# Patient Record
Sex: Female | Born: 1970 | Race: White | Hispanic: No | Marital: Married | Smoking: Current every day smoker
Health system: Southern US, Community
[De-identification: ages and names within clinical notes are randomized; demographics above are authoritative.]

## PROBLEM LIST (undated history)

## (undated) DIAGNOSIS — G459 Transient cerebral ischemic attack, unspecified: Secondary | ICD-10-CM

## (undated) DIAGNOSIS — I10 Essential (primary) hypertension: Secondary | ICD-10-CM

## (undated) DIAGNOSIS — I639 Cerebral infarction, unspecified: Secondary | ICD-10-CM

## (undated) DIAGNOSIS — G43909 Migraine, unspecified, not intractable, without status migrainosus: Secondary | ICD-10-CM

## (undated) DIAGNOSIS — R000 Tachycardia, unspecified: Secondary | ICD-10-CM

## (undated) DIAGNOSIS — R0609 Other forms of dyspnea: Secondary | ICD-10-CM

## (undated) DIAGNOSIS — F419 Anxiety disorder, unspecified: Secondary | ICD-10-CM

## (undated) HISTORY — PX: BACK SURGERY: SHX140

## (undated) HISTORY — PX: HERNIA REPAIR: SHX51

## (undated) HISTORY — DX: Other forms of dyspnea: R06.09

## (undated) HISTORY — PX: COSMETIC SURGERY: SHX468

---

## 2002-09-28 HISTORY — PX: TUBAL LIGATION: SHX77

## 2011-09-29 DIAGNOSIS — R0600 Dyspnea, unspecified: Secondary | ICD-10-CM

## 2011-09-29 DIAGNOSIS — R0609 Other forms of dyspnea: Secondary | ICD-10-CM

## 2011-09-29 HISTORY — DX: Dyspnea, unspecified: R06.00

## 2011-09-29 HISTORY — DX: Other forms of dyspnea: R06.09

## 2014-05-24 ENCOUNTER — Emergency Department (HOSPITAL_COMMUNITY)
Admission: EM | Admit: 2014-05-24 | Discharge: 2014-05-24 | Disposition: A | Discharge status: Home / Self Care | Payer: Self-pay | Attending: Emergency Medicine | Admitting: Emergency Medicine

## 2014-05-24 ENCOUNTER — Encounter (HOSPITAL_COMMUNITY): Payer: Self-pay | Admitting: Emergency Medicine

## 2014-05-24 DIAGNOSIS — R51 Headache: Secondary | ICD-10-CM | POA: Insufficient documentation

## 2014-05-24 DIAGNOSIS — F17200 Nicotine dependence, unspecified, uncomplicated: Secondary | ICD-10-CM | POA: Insufficient documentation

## 2014-05-24 DIAGNOSIS — Z8659 Personal history of other mental and behavioral disorders: Secondary | ICD-10-CM | POA: Insufficient documentation

## 2014-05-24 DIAGNOSIS — R000 Tachycardia, unspecified: Secondary | ICD-10-CM | POA: Insufficient documentation

## 2014-05-24 DIAGNOSIS — G43809 Other migraine, not intractable, without status migrainosus: Secondary | ICD-10-CM | POA: Insufficient documentation

## 2014-05-24 DIAGNOSIS — I10 Essential (primary) hypertension: Secondary | ICD-10-CM | POA: Insufficient documentation

## 2014-05-24 HISTORY — DX: Anxiety disorder, unspecified: F41.9

## 2014-05-24 HISTORY — DX: Essential (primary) hypertension: I10

## 2014-05-24 HISTORY — DX: Tachycardia, unspecified: R00.0

## 2014-05-24 HISTORY — DX: Migraine, unspecified, not intractable, without status migrainosus: G43.909

## 2014-05-24 MED ORDER — DEXAMETHASONE SODIUM PHOSPHATE 4 MG/ML IJ SOLN
10.0000 mg | Freq: Once | INTRAMUSCULAR | Status: AC
  Administered 2014-05-24: 10 mg via INTRAVENOUS
  Filled 2014-05-24: qty 3

## 2014-05-24 MED ORDER — METOCLOPRAMIDE HCL 5 MG/ML IJ SOLN
10.0000 mg | Freq: Once | INTRAMUSCULAR | Status: AC
  Administered 2014-05-24: 10 mg via INTRAVENOUS
  Filled 2014-05-24: qty 2

## 2014-05-24 MED ORDER — PROMETHAZINE HCL 25 MG PO TABS
25.0000 mg | ORAL_TABLET | Freq: Four times a day (QID) | ORAL | Status: DC | PRN
Start: 2014-05-24 — End: 2014-06-07

## 2014-05-24 MED ORDER — BUTALBITAL-APAP-CAFFEINE 50-325-40 MG PO TABS: 1.0000 | ORAL_TABLET | Freq: Four times a day (QID) | ORAL | Status: DC | PRN

## 2014-05-24 MED ORDER — KETOROLAC TROMETHAMINE 30 MG/ML IJ SOLN
30.0000 mg | Freq: Once | INTRAMUSCULAR | Status: AC
  Administered 2014-05-24: 30 mg via INTRAVENOUS
  Filled 2014-05-24: qty 1

## 2014-05-24 MED ORDER — SODIUM CHLORIDE 0.9 % IV BOLUS (SEPSIS)
1000.0000 mL | Freq: Once | INTRAVENOUS | Status: AC
  Administered 2014-05-24: 1000 mL via INTRAVENOUS

## 2014-05-24 MED ORDER — PROMETHAZINE HCL 25 MG/ML IJ SOLN
25.0000 mg | INTRAMUSCULAR | Status: DC | PRN
  Administered 2014-05-24: 25 mg via INTRAVENOUS
  Filled 2014-05-24: qty 1

## 2014-05-24 MED ORDER — DIPHENHYDRAMINE HCL 50 MG/ML IJ SOLN
25.0000 mg | Freq: Once | INTRAMUSCULAR | Status: AC
  Administered 2014-05-24: 25 mg via INTRAVENOUS
  Filled 2014-05-24: qty 1

## 2014-05-24 MED ORDER — HYDROMORPHONE HCL PF 1 MG/ML IJ SOLN
0.5000 mg | Freq: Once | INTRAMUSCULAR | Status: AC
  Administered 2014-05-24: 0.5 mg via INTRAVENOUS
  Filled 2014-05-24: qty 1

## 2014-05-24 NOTE — Discharge Instructions (Signed)

## 2014-05-24 NOTE — ED Notes (Signed)
Pt co migraine on and off x 7 days, with nausea. Pt has HX of migraines. Light and sound sensitivity noted.

## 2014-05-24 NOTE — ED Provider Notes (Signed)
This chart was scribed for Dominique Maw Alasha Mcguinness, DO, by Yevette Edwards, ED Scribe. This patient was seen in room APA07/APA07 and the patient's care was started at 3:17 PM.  TIME SEEN: 3:17 PM  CHIEF COMPLAINT:  Chief Complaint  Patient presents with  . Migraine    HPI:   HPI Comments: Dominique Harrington is a 43 y.o. female history of hypertension, migraine headaches who presents to the Emergency Department complaining of a migraine which has persisted for seven days intermittently and which has been associated with nausea, a decreased appetite and photophobia. She describes the pain as "someone taking an axe to the top of my head." Light and noise increase the pain. She has used Excedrin, ibuprofen, and benadryl without resolution. She has been treated in [facility] ED for migraines previously; she reports improvement with Tramadol, Dilaudid, Nubain, and Phenergan. She does not find improvement with morphine. If this headache feels exactly like her prior migraines.  She denies recent head injury, emesis, neck pain, neck stiffness, or fevers. She is not on anticoagulation.  The pt takes Toperol XL 100 mg for HTN; she took it today. In the ED her BP is 149/124.   Dominique Harrington is a current smoker.   ROS: See HPI Constitutional: no fever  Eyes: no drainage  ENT: no runny nose   Cardiovascular:  no chest pain  Resp: no SOB  GI: no vomiting, positive nausea GU: no dysuria Integumentary: no rash  Allergy: no hives  Musculoskeletal: no leg swelling, no neck pain, no neck stiffness Neurological: no slurred speech ROS otherwise negative  PAST MEDICAL HISTORY/PAST SURGICAL HISTORY:  Past Medical History  Diagnosis Date  . Hypertension   . Migraines   . Anxiety   . Tachycardia     MEDICATIONS:  Prior to Admission medications   Not on File    ALLERGIES:  Allergies  Allergen Reactions  . Sulfa Antibiotics Nausea And Vomiting    SOCIAL HISTORY:  History  Substance Use Topics  .  Smoking status: Current Every Day Smoker -- 1.00 packs/day    Types: Cigarettes  . Smokeless tobacco: Not on file  . Alcohol Use: No    FAMILY HISTORY: History reviewed. No pertinent family history.  EXAM: Triage Vitals: BP 149/124  Pulse 115  Temp(Src) 98.5 F (36.9 C) (Oral)  Resp 20  SpO2 99%  LMP 05/17/2014  CONSTITUTIONAL: Alert and oriented and responds appropriately to questions. Well-appearing; well-nourished HEAD: Normocephalic EYES: Conjunctivae clear, PERRL; photophobia present ENT: normal nose; no rhinorrhea; moist mucous membranes; pharynx without lesions noted NECK: Supple, no meningismus, no LAD  CARD: Regular and tachycardic; S1 and S2 appreciated; no murmurs, no clicks, no rubs, no gallops RESP: Normal chest excursion without splinting or tachypnea; breath sounds clear and equal bilaterally; no wheezes, no rhonchi, no rales,  ABD/GI: Normal bowel sounds; non-distended; soft, non-tender, no rebound, no guarding BACK:  The back appears normal and is non-tender to palpation, there is no CVA tenderness EXT: Normal ROM in all joints; non-tender to palpation; no edema; normal capillary refill; no cyanosis    SKIN: Normal color for age and race; warm NEURO: Moves all extremities equally; sensation intact diffusely; strength 5/5 in all 4 extremities; cranial nerves 2-12 intact PSYCH: The patient's mood and manner are appropriate. Grooming and personal hygiene are appropriate.  MEDICAL DECISION MAKING: Patient here with typical migraine headache. Will give migraine cocktail with Toradol, Reglan, Benadryl, IV fluids and Decadron. I am not concerned for any intracranial hemorrhage, infarct,  cavernous sinus thrombosis, infectious etiology. She is neurologically intact. She is hypertensive and states she has taken her blood pressure medication today. Suspect this is secondary to pain and we'll reassess once her pain is better controlled.  ED PROGRESS:    4:30 PM  Pt reports  mild improvement with migraine cocktail. She states her headache is now completely gone. She is asking something else for control for her pain. We'll give 0.5mg  IV Diluadid and phenergan.  6:15 PM  Pt reports her pain is now completely gone. She states that she feels much better and is ready for discharge home. Her blood pressure has also improved. I feel she is safe to be discharged. We'll discharge with prescription for Fioricet and Phenergan to take at home. Have discussed strict return precautions and supportive care instructions. She verbalized understanding and is comfortable with plan.  Dominique Maw Deborha Moseley, DO 05/24/14 1825

## 2014-06-07 ENCOUNTER — Encounter (HOSPITAL_COMMUNITY): Payer: Self-pay | Admitting: Emergency Medicine

## 2014-06-07 ENCOUNTER — Emergency Department (HOSPITAL_COMMUNITY)
Admission: EM | Admit: 2014-06-07 | Discharge: 2014-06-07 | Disposition: A | Discharge status: Home / Self Care | Payer: Self-pay | Attending: Emergency Medicine | Admitting: Emergency Medicine

## 2014-06-07 DIAGNOSIS — I10 Essential (primary) hypertension: Secondary | ICD-10-CM | POA: Insufficient documentation

## 2014-06-07 DIAGNOSIS — Z79899 Other long term (current) drug therapy: Secondary | ICD-10-CM | POA: Insufficient documentation

## 2014-06-07 DIAGNOSIS — Z8659 Personal history of other mental and behavioral disorders: Secondary | ICD-10-CM | POA: Insufficient documentation

## 2014-06-07 DIAGNOSIS — G43909 Migraine, unspecified, not intractable, without status migrainosus: Secondary | ICD-10-CM | POA: Insufficient documentation

## 2014-06-07 DIAGNOSIS — F17200 Nicotine dependence, unspecified, uncomplicated: Secondary | ICD-10-CM | POA: Insufficient documentation

## 2014-06-07 MED ORDER — METOCLOPRAMIDE HCL 5 MG/ML IJ SOLN
10.0000 mg | Freq: Once | INTRAMUSCULAR | Status: AC
  Administered 2014-06-07: 10 mg via INTRAVENOUS
  Filled 2014-06-07: qty 2

## 2014-06-07 MED ORDER — KETOROLAC TROMETHAMINE 30 MG/ML IJ SOLN
30.0000 mg | Freq: Once | INTRAMUSCULAR | Status: AC
  Administered 2014-06-07: 30 mg via INTRAVENOUS
  Filled 2014-06-07: qty 1

## 2014-06-07 MED ORDER — DIPHENHYDRAMINE HCL 50 MG/ML IJ SOLN
25.0000 mg | Freq: Once | INTRAMUSCULAR | Status: AC
  Administered 2014-06-07: 25 mg via INTRAVENOUS
  Filled 2014-06-07: qty 1

## 2014-06-07 MED ORDER — SODIUM CHLORIDE 0.9 % IV BOLUS (SEPSIS)
1000.0000 mL | Freq: Once | INTRAVENOUS | Status: AC
  Administered 2014-06-07: 1000 mL via INTRAVENOUS

## 2014-06-07 NOTE — Discharge Instructions (Signed)
Resting quiet dark room. Increase fluids.

## 2014-06-07 NOTE — ED Provider Notes (Signed)
CSN: [id number]     Arrival date & time 06/07/14  0915 History  This chart was scribed for Donnetta Hutching, MD by Leone Payor, ED Scribe. This patient was seen in room APA12/APA12 and the patient's care was started 9:57 AM.     Chief Complaint  Patient presents with  . Migraine   The history is provided by the patient. No language interpreter was used.    HPI Comments: Dominique Harrington is a 43 y.o. female who presents to the Emergency Department complaining of 2 weeks of intermittent, gradually worsened frontal HA with associated photophobia and nausea. She reports having vomiting that began this morning. She has a history of migraines and states this is similar in nature. She has taken Tylenol without relief. Severity is moderate. Negative CT head in the past.  Past Medical History  Diagnosis Date  . Hypertension   . Migraines   . Anxiety   . Tachycardia    Past Surgical History  Procedure Laterality Date  . Tubal ligation  2004  . Back surgery    . Cosmetic surgery    . Hernia repair     History reviewed. No pertinent family history. History  Substance Use Topics  . Smoking status: Current Every Day Smoker -- 1.00 packs/day    Types: Cigarettes  . Smokeless tobacco: Not on file  . Alcohol Use: No   OB History   Grav Para Term Preterm Abortions TAB SAB Ect Mult Living                 Review of Systems  A complete 10 system review of systems was obtained and all systems are negative except as noted in the HPI and PMH.    Allergies  Sulfa antibiotics  Home Medications   Prior to Admission medications   Medication Sig Start Date End Date Taking? Authorizing Provider  acetaminophen (TYLENOL) 500 MG tablet Take 1,000 mg by mouth daily as needed for headache.   Yes Historical Provider, MD  amLODipine (NORVASC) 5 MG tablet Take 5 mg by mouth as needed (chest pain).   Yes Historical Provider, MD  ibuprofen (ADVIL,MOTRIN) 200 MG tablet Take 800 mg by mouth as needed for  headache.   Yes Historical Provider, MD  metoprolol succinate (TOPROL-XL) 100 MG 24 hr tablet Take 100 mg by mouth daily. Take with or immediately following a meal.   Yes Historical Provider, MD   BP 139/102  Pulse 102  Temp(Src) 98.7 F (37.1 C) (Oral)  Resp 18  Ht  (1.702 m)  Wt 230 lb (104.327 kg)  BMI 36.01 kg/m2  SpO2 97%  LMP 05/17/2014 Physical Exam  Nursing note and vitals reviewed. Constitutional: She is oriented to person, place, and time. She appears well-developed and well-nourished.  HENT:  Head: Normocephalic and atraumatic.  Eyes: Conjunctivae and EOM are normal. Pupils are equal, round, and reactive to light.  Photophobia noted  Neck: Normal range of motion. Neck supple.  Cardiovascular: Normal rate, regular rhythm and normal heart sounds.   Pulmonary/Chest: Effort normal and breath sounds normal.  Abdominal: Soft. Bowel sounds are normal.  Musculoskeletal: Normal range of motion.  Neurological: She is alert and oriented to person, place, and time.  Skin: Skin is warm and dry.  Psychiatric: She has a normal mood and affect. Her behavior is normal.    ED Course  Procedures (including critical care time)  DIAGNOSTIC STUDIES: Oxygen Saturation is 97% on RA, adequate by my interpretation.  COORDINATION OF CARE: 10:01AM Discussed treatment plan with pt at bedside and pt agreed to plan.   Labs Review Labs Reviewed - No data to display  Imaging Review No results found.   EKG Interpretation None      MDM   Final diagnoses:  Migraine without status migrainosus, not intractable, unspecified migraine type   No neurological deficits. Patient feels better after IV fluids, IV Benadryl, Reglan, Toradol  I personally performed the services described in this documentation, which was scribed in my presence. The recorded information has been reviewed and is accurate.   Donnetta Hutching, MD 06/07/14 1110

## 2014-06-07 NOTE — ED Notes (Signed)
Migraine off and on for 2 weeks. States she is vomiting now and unable to manage it at home.

## 2014-10-24 ENCOUNTER — Emergency Department (HOSPITAL_COMMUNITY)
Admission: EM | Admit: 2014-10-24 | Discharge: 2014-10-24 | Disposition: A | Discharge status: Home / Self Care | Payer: Self-pay | Attending: Emergency Medicine | Admitting: Emergency Medicine

## 2014-10-24 ENCOUNTER — Encounter (HOSPITAL_COMMUNITY): Payer: Self-pay | Admitting: *Deleted

## 2014-10-24 DIAGNOSIS — Z720 Tobacco use: Secondary | ICD-10-CM | POA: Insufficient documentation

## 2014-10-24 DIAGNOSIS — I10 Essential (primary) hypertension: Secondary | ICD-10-CM | POA: Insufficient documentation

## 2014-10-24 DIAGNOSIS — G43009 Migraine without aura, not intractable, without status migrainosus: Secondary | ICD-10-CM

## 2014-10-24 DIAGNOSIS — Z8659 Personal history of other mental and behavioral disorders: Secondary | ICD-10-CM | POA: Insufficient documentation

## 2014-10-24 DIAGNOSIS — Z79899 Other long term (current) drug therapy: Secondary | ICD-10-CM | POA: Insufficient documentation

## 2014-10-24 MED ORDER — BUTALBITAL-APAP-CAFFEINE 50-325-40 MG PO TABS: 1.0000 | ORAL_TABLET | Freq: Four times a day (QID) | ORAL | Status: DC | PRN

## 2014-10-24 MED ORDER — PROMETHAZINE HCL 12.5 MG PO TABS: 12.5000 mg | ORAL_TABLET | Freq: Four times a day (QID) | ORAL | Status: DC | PRN

## 2014-10-24 MED ORDER — DEXAMETHASONE SODIUM PHOSPHATE 4 MG/ML IJ SOLN
10.0000 mg | Freq: Once | INTRAMUSCULAR | Status: AC
  Administered 2014-10-24: 10 mg via INTRAVENOUS
  Filled 2014-10-24: qty 3

## 2014-10-24 MED ORDER — SODIUM CHLORIDE 0.9 % IV SOLN
INTRAVENOUS | Status: DC
  Administered 2014-10-24: 11:00:00 via INTRAVENOUS

## 2014-10-24 MED ORDER — METOCLOPRAMIDE HCL 5 MG/ML IJ SOLN
10.0000 mg | Freq: Once | INTRAMUSCULAR | Status: AC
  Administered 2014-10-24: 10 mg via INTRAVENOUS
  Filled 2014-10-24: qty 2

## 2014-10-24 MED ORDER — DIPHENHYDRAMINE HCL 50 MG/ML IJ SOLN
25.0000 mg | Freq: Once | INTRAMUSCULAR | Status: AC
  Administered 2014-10-24: 25 mg via INTRAVENOUS
  Filled 2014-10-24: qty 1

## 2014-10-24 NOTE — ED Notes (Signed)
Tolerated fluid intake well.   

## 2014-10-24 NOTE — Discharge Instructions (Signed)
Be sure to follow up for your elevated blood pressure. Return here as needed.   Migraine Headache A migraine headache is very bad, throbbing pain on one or both sides of your head. Talk to your doctor about what things may bring on (trigger) your migraine headaches. HOME CARE  Only take medicines as told by your doctor.  Lie down in a dark, quiet room when you have a migraine.  Keep a journal to find out if certain things bring on migraine headaches. For example, write down:  What you eat and drink.  How much sleep you get.  Any change to your diet or medicines.  Lessen how much alcohol you drink.  Quit smoking if you smoke.  Get enough sleep.  Lessen any stress in your life.  Keep lights dim if bright lights bother you or make your migraines worse. GET HELP RIGHT AWAY IF:   Your migraine becomes really bad.  You have a fever.  You have a stiff neck.  You have trouble seeing.  Your muscles are weak, or you lose muscle control.  You lose your balance or have trouble walking.  You feel like you will pass out (faint), or you pass out.  You have really bad symptoms that are different than your first symptoms. MAKE SURE YOU:   Understand these instructions.  Will watch your condition.  Will get help right away if you are not doing well or get worse. Document Released: 06/23/2008 Document Revised: 12/07/2011 Document Reviewed: 05/22/2013 [facility] Patient Information 2015 [employer] [place]. This information is not intended to replace advice given to you by your health care provider. Make sure you discuss any questions you have with your health care provider.

## 2014-10-24 NOTE — ED Notes (Signed)
Migraine x 4-5 days. Vomiting began yesterday. Able to keep meds down. Headache is similar to those in the past.

## 2014-10-24 NOTE — ED Provider Notes (Signed)
CSN: [id number]     Arrival date & time 10/24/14  1028 History   First MD Initiated Contact with Patient 10/24/14 1029     Chief Complaint  Patient presents with  . Migraine     (Consider location/radiation/quality/duration/timing/severity/associated sxs/prior Treatment) Patient is a 44 y.o. female presenting with migraines. The history is provided by the patient. No language interpreter was used.  Migraine This is a new problem. The current episode started in the past 7 days. The problem occurs constantly. The problem has been gradually worsening. Associated symptoms include headaches, nausea and vomiting. Exacerbated by: light, noise. She has tried NSAIDs and acetaminophen for the symptoms. The treatment provided no relief.   Dominique Harrington is a 44 y.o. female who presents to the ED with a headache that started 4 days ago. She has a history of migraines and this is similar. She has taken OTC medications without relief. She had nausea yesterday and today has been vomiting. The headache is located in the frontal area and she describes the pain as throbbing each time her heart beats.   Past Medical History  Diagnosis Date  . Hypertension   . Migraines   . Anxiety   . Tachycardia    Past Surgical History  Procedure Laterality Date  . Tubal ligation  2004  . Back surgery    . Cosmetic surgery    . Hernia repair     No family history on file. History  Substance Use Topics  . Smoking status: Current Every Day Smoker -- 1.00 packs/day    Types: Cigarettes  . Smokeless tobacco: Not on file  . Alcohol Use: No   OB History    No data available     Review of Systems  Gastrointestinal: Positive for nausea and vomiting.  Neurological: Positive for light-headedness and headaches.  all other systems negative    Allergies  Sulfa antibiotics  Home Medications   Prior to Admission medications   Medication Sig Start Date End Date Taking? Authorizing Provider  acetaminophen  (TYLENOL) 500 MG tablet Take 1,000 mg by mouth daily as needed for headache.   Yes Historical Provider, MD  amLODipine (NORVASC) 5 MG tablet Take 5 mg by mouth as needed (chest pain).   Yes Historical Provider, MD  ibuprofen (ADVIL,MOTRIN) 200 MG tablet Take 800 mg by mouth as needed for headache.   Yes Historical Provider, MD  metoprolol succinate (TOPROL-XL) 100 MG 24 hr tablet Take 100 mg by mouth daily. Take with or immediately following a meal.   Yes Historical Provider, MD  butalbital-acetaminophen-caffeine (FIORICET) 50-325-40 MG per tablet Take 1-2 tablets by mouth every 6 (six) hours as needed for migraine. 10/24/14 10/24/15  Hope Orlene Och, NP  promethazine (PHENERGAN) 12.5 MG tablet Take 1 tablet (12.5 mg total) by mouth every 6 (six) hours as needed for nausea or vomiting. 10/24/14   Hope Orlene Och, NP   BP 163/108 mmHg  Pulse 74  Resp 17  SpO2 100% Physical Exam  Constitutional: She is oriented to person, place, and time. She appears well-developed and well-nourished. No distress.  HENT:  Head: Normocephalic and atraumatic.  Right Ear: Tympanic membrane normal.  Left Ear: Tympanic membrane normal.  Nose: Nose normal.  Mouth/Throat: Uvula is midline, oropharynx is clear and moist and mucous membranes are normal.  Eyes: Conjunctivae and EOM are normal.  Neck: Normal range of motion. Neck supple.  Cardiovascular: Normal rate and regular rhythm.   Pulmonary/Chest: Effort normal. She has no wheezes. She  has no rales.  Abdominal: Soft. Bowel sounds are normal. She exhibits no mass. There is no tenderness.  Musculoskeletal: She exhibits no edema.  Radial and pedal pulses strong, adequate circulation, good touch sensation.  Neurological: She is alert and oriented to person, place, and time. She has normal strength. No cranial nerve deficit or sensory deficit. She displays a negative Romberg sign. Gait normal.  Reflex Scores:      Bicep reflexes are 2+ on the right side and 2+ on the left  side.      Brachioradialis reflexes are 2+ on the right side and 2+ on the left side.      Patellar reflexes are 2+ on the right side and 2+ on the left side.      Achilles reflexes are 2+ on the right side and 2+ on the left side. Rapid alternating movement without difficulty. Stands on one foot without difficulty.  Psychiatric: She has a normal mood and affect. Her behavior is normal.  Nursing note and vitals reviewed.   ED Course  Procedures  After IV Benadryl 25mg , Reglan 10 mg and Decadron 10 mg. The patient is feeling much better. No nausea, very little pain. Taking PO fluids without difficulty. Patient states she is ready to go home.  MDM  44 y.o. female with hx of migraines and headache that is similar to usual migraines. Responded well to treatment in the ED. Stable for discharge without neuro deficits. No signs of SAH or meningitis at this time. Discussed with the patient clinical findings and plan of care. I discussed her elevated BP and need for follow up. She voices understanding and agrees with plan. She is taking her BP medication as directed. All questioned fully answered. She will return if any problems arise.  Final diagnoses:  Migraine without aura and without status migrainosus, not intractable      Janne NapoleonHope M Neese, NP 10/24/14 1236  Gilda Creasehristopher J. Pollina, MD 10/24/14 1537

## 2014-10-24 NOTE — ED Notes (Signed)
Informed Kerrie BuffaloHope Neese, NP of results.  Okay to d/c home if DBP in low 100's.

## 2014-10-24 NOTE — ED Notes (Signed)
Given Spite.

## 2014-12-12 ENCOUNTER — Other Ambulatory Visit (HOSPITAL_COMMUNITY): Payer: Self-pay | Admitting: Physician Assistant

## 2014-12-12 DIAGNOSIS — Z1231 Encounter for screening mammogram for malignant neoplasm of breast: Secondary | ICD-10-CM

## 2014-12-19 ENCOUNTER — Ambulatory Visit (HOSPITAL_COMMUNITY)
Admission: RE | Admit: 2014-12-19 | Discharge: 2014-12-19 | Disposition: A | Discharge status: Home / Self Care | Payer: Self-pay | Source: Ambulatory Visit | Attending: Physician Assistant | Admitting: Physician Assistant

## 2014-12-19 DIAGNOSIS — Z1231 Encounter for screening mammogram for malignant neoplasm of breast: Secondary | ICD-10-CM

## 2015-04-17 ENCOUNTER — Encounter: Payer: Self-pay | Admitting: *Deleted

## 2015-05-21 ENCOUNTER — Encounter: Payer: Self-pay | Admitting: Cardiovascular Disease

## 2015-07-31 ENCOUNTER — Encounter (HOSPITAL_COMMUNITY): Payer: Self-pay | Admitting: *Deleted

## 2015-07-31 ENCOUNTER — Emergency Department (HOSPITAL_COMMUNITY)
Admission: EM | Admit: 2015-07-31 | Discharge: 2015-07-31 | Disposition: A | Discharge status: Home / Self Care | Payer: Self-pay | Attending: Emergency Medicine | Admitting: Emergency Medicine

## 2015-07-31 DIAGNOSIS — F419 Anxiety disorder, unspecified: Secondary | ICD-10-CM | POA: Insufficient documentation

## 2015-07-31 DIAGNOSIS — Z720 Tobacco use: Secondary | ICD-10-CM | POA: Insufficient documentation

## 2015-07-31 DIAGNOSIS — G8929 Other chronic pain: Secondary | ICD-10-CM | POA: Insufficient documentation

## 2015-07-31 DIAGNOSIS — I10 Essential (primary) hypertension: Secondary | ICD-10-CM | POA: Insufficient documentation

## 2015-07-31 DIAGNOSIS — Z79899 Other long term (current) drug therapy: Secondary | ICD-10-CM | POA: Insufficient documentation

## 2015-07-31 DIAGNOSIS — G43809 Other migraine, not intractable, without status migrainosus: Secondary | ICD-10-CM

## 2015-07-31 MED ORDER — METOCLOPRAMIDE HCL 10 MG PO TABS: 10.0000 mg | ORAL_TABLET | Freq: Four times a day (QID) | ORAL | Status: DC | PRN

## 2015-07-31 MED ORDER — DIPHENHYDRAMINE HCL 50 MG/ML IJ SOLN
50.0000 mg | Freq: Once | INTRAMUSCULAR | Status: AC
  Administered 2015-07-31: 50 mg via INTRAMUSCULAR
  Filled 2015-07-31: qty 1

## 2015-07-31 MED ORDER — KETOROLAC TROMETHAMINE 60 MG/2ML IM SOLN
60.0000 mg | Freq: Once | INTRAMUSCULAR | Status: AC
  Administered 2015-07-31: 60 mg via INTRAMUSCULAR
  Filled 2015-07-31: qty 2

## 2015-07-31 MED ORDER — METOCLOPRAMIDE HCL 5 MG/ML IJ SOLN
10.0000 mg | Freq: Once | INTRAMUSCULAR | Status: AC
  Administered 2015-07-31: 10 mg via INTRAMUSCULAR
  Filled 2015-07-31: qty 2

## 2015-07-31 NOTE — ED Notes (Signed)
Patient reports headache for past week. Became nauseated and sensitive to light Monday. Patient has hx of migraines.

## 2015-07-31 NOTE — ED Provider Notes (Signed)
CSN: [id number]     Arrival date & time 07/31/15  4098 History   First MD Initiated Contact with Patient 07/31/15 [phone]     Chief Complaint  Patient presents with  . Migraine      HPI Pt was seen at 0940. Per pt, c/o gradual onset and persistence of constant acute flair of her chronic migraine headache for the past 1 week.  Describes the headache as per her usual chronic migraine headache pain pattern for many years.  Denies headache was sudden or maximal in onset or at any time.  Denies visual changes, no focal motor weakness, no tingling/numbness in extremities, no fevers, no neck pain, no rash.      Past Medical History  Diagnosis Date  . Hypertension   . Migraines   . Anxiety   . Tachycardia   . Dyspnea on exertion 2013    Echo, EF =>55%   Past Surgical History  Procedure Laterality Date  . Tubal ligation  2004  . Back surgery    . Cosmetic surgery    . Hernia repair      Social History  Substance Use Topics  . Smoking status: Current Every Day Smoker -- 1.00 packs/day    Types: Cigarettes  . Smokeless tobacco: None  . Alcohol Use: No    Review of Systems ROS: Statement: All systems negative except as marked or noted in the HPI; Constitutional: Negative for fever and chills. ; ; Eyes: Negative for eye pain, redness and discharge. ; ; ENMT: Negative for ear pain, hoarseness, nasal congestion, sinus pressure and sore throat. ; ; Cardiovascular: Negative for chest pain, palpitations, diaphoresis, dyspnea and peripheral edema. ; ; Respiratory: Negative for cough, wheezing and stridor. ; ; Gastrointestinal: +nausea. Negative for vomiting, diarrhea, abdominal pain, blood in stool, hematemesis, jaundice and rectal bleeding. . ; ; Genitourinary: Negative for dysuria, flank pain and hematuria. ; ; Musculoskeletal: Negative for back pain and neck pain. Negative for swelling and trauma.; ; Skin: Negative for pruritus, rash, abrasions, blisters, bruising and skin lesion.; ; Neuro:  +headache. Negative for lightheadedness and neck stiffness. Negative for weakness, altered level of consciousness , altered mental status, extremity weakness, paresthesias, involuntary movement, seizure and syncope.      Allergies  Sulfa antibiotics  Home Medications   Prior to Admission medications   Medication Sig Start Date End Date Taking? Authorizing Provider  acetaminophen (TYLENOL) 500 MG tablet Take 1,000 mg by mouth daily as needed for headache.   Yes Historical Provider, MD  amLODipine (NORVASC) 5 MG tablet Take 5 mg by mouth as needed (chest pain).   Yes Historical Provider, MD  busPIRone (BUSPAR) 10 MG tablet Take 10 mg by mouth 2 (two) times daily.   Yes Historical Provider, MD  citalopram (CELEXA) 20 MG tablet Take 20 mg by mouth daily.   Yes Historical Provider, MD  hydrOXYzine (ATARAX/VISTARIL) 25 MG tablet Take 25 mg by mouth 3 (three) times daily as needed.   Yes Historical Provider, MD  ibuprofen (ADVIL,MOTRIN) 200 MG tablet Take 800 mg by mouth as needed for headache.   Yes Historical Provider, MD  metoprolol succinate (TOPROL-XL) 100 MG 24 hr tablet Take 100 mg by mouth daily. Take with or immediately following a meal.   Yes Historical Provider, MD  Omega-3 Fatty Acids (FISH OIL PO) Take 1 capsule by mouth daily.   Yes Historical Provider, MD   BP 166/103 mmHg  Pulse 50  Temp(Src) 97.6 F (36.4 C) (Oral)  Resp  18  Ht 5\' 7"  (1.702 m)  Wt 220 lb (99.791 kg)  BMI 34.45 kg/m2  SpO2 96% Physical Exam  0945: Physical examination:  Nursing notes reviewed; Vital signs and O2 SAT reviewed;  Constitutional: Well developed, Well nourished, Well hydrated, In no acute distress; Head:  Normocephalic, atraumatic; Eyes: EOMI, PERRL, No scleral icterus; ENMT: TM's clear bilat. Mouth and pharynx normal, Mucous membranes moist; Neck: Supple, Full range of motion, No lymphadenopathy; Cardiovascular: Regular rate and rhythm, No murmur, rub, or gallop; Respiratory: Breath sounds clear &  equal bilaterally, No rales, rhonchi, wheezes.  Speaking full sentences with ease, Normal respiratory effort/excursion; Chest: Nontender, Movement normal; Abdomen: Soft, Nontender, Nondistended, Normal bowel sounds; Genitourinary: No CVA tenderness; Extremities: Pulses normal, No tenderness, No edema, No calf edema or asymmetry.; Neuro: AA&Ox3, Major CN grossly intact. No facial droop. Speech clear. No gross focal motor or sensory deficits in extremities. Climbs on and off stretcher easily by herself. Gait steady.; Skin: Color normal, Warm, Dry.   ED Course  Procedures (including critical care time) Labs Review   Imaging Review  I have personally reviewed and evaluated these images and lab results as part of my medical decision-making.   EKG Interpretation None      MDM  MDM Reviewed: previous chart, nursing note and vitals    1035:  Acute flair of chronic headache, no red flags on HPI. Tx headache symptomatically at this time. Dx d/w pt and family.  Questions answered.  Verb understanding, agreeable to d/c home with outpt f/u.     Samuel JesterKathleen Ashish Rossetti, DO 08/04/15 2028

## 2015-07-31 NOTE — Discharge Instructions (Signed)
°Emergency Department Resource Guide °1) Find a Doctor and Pay Out of Pocket °Although you won't have to find out who is covered by your insurance plan, it is a good idea to ask around and get recommendations. You will then need to call the office and see if the doctor you have chosen will accept you as a new patient and what types of options they offer for patients who are self-pay. Some doctors offer discounts or will set up payment plans for their patients who do not have insurance, but you will need to ask so you aren't surprised when you get to your appointment. ° °2) Contact Your Local Health Department °Not all health departments have doctors that can see patients for sick visits, but many do, so it is worth a call to see if yours does. If you don't know where your local health department is, you can check in your phone book. The CDC also has a tool to help you locate your state's health department, and many state websites also have listings of all of their local health departments. ° °3) Find a Walk-in Clinic °If your illness is not likely to be very severe or complicated, you may want to try a walk in clinic. These are popping up all over the country in pharmacies, drugstores, and shopping centers. They're usually staffed by nurse practitioners or physician assistants that have been trained to treat common illnesses and complaints. They're usually fairly quick and inexpensive. However, if you have serious medical issues or chronic medical problems, these are probably not your best option. ° °No Primary Care Doctor: °- Call [facility] at  832-8000 - they can help you locate a primary care doctor that  accepts your insurance, provides certain services, etc. °- Physician Referral Service- 1-800-533-3463 ° °Chronic Pain Problems: °Organization         Address  Phone   Notes  °[facility]  (336) 297-2271 Patients need to be referred by their primary care doctor.  ° °Medication  Assistance: °Organization         Address  Phone   Notes  °[place] [facility] [address] °[place], [place] [address] (336) 641-8030 --Must be a resident of [place] °-- Must have NO insurance coverage whatsoever (no Medicaid/ Medicare, etc.) °-- The pt. MUST have a primary care doctor that directs their care regularly and follows them in the community °  °[facility]  (866) 331-1348   °[employer]  (888) 892-1162   ° °Agencies that provide inexpensive medical care: °Organization         Address  Phone   Notes  °[facility]  (336) 832-8035   °[facility]    (336) 832-7272   °[facility] [address] °[place], [place] [address] (336) 832-4777   °[facility] [address], °[place] (336) 271-4999   °[facility]    (336) 373-0678   °[facility]    (336) 272-1050   °[facility] ° [address], [place] Phone:  (336) 832-4444, Fax:  (336) 832-4440 Hours of Operation:  9 am - 6 pm, M-F.  Also accepts Medicaid/Medicare and self-pay.  °[facility] ° [address], [place] Phone: (336) [phone], Fax: (336) [phone]. Hours of Operation:  8:30 am - 5:30 pm, M-F.  Also accepts Medicaid and self-pay.  °[facility] [place] [address], [place] Phone: (336) 878-6027   °[facility] [address], [place], [place] (336)723-1848, Ext. 123 Mondays & Thursdays: 7-9 AM.  First 15 patients are seen on a first come, first serve basis. °  ° °Medicaid-accepting [place] Providers: ° °Organization         Address  Phone   Notes  °[facility] [address], [place] (336) 641-2100 Also accepts self-pay patients.  °[facility] [address], [place] ° (336) 856-9996   °[facility] [address], [place]  (336) 288-8857   °[facility] [address] [place] [address], [place] (336) 299-7000   °Veita Bland [address], [place]  ° (336) 373-1557 Only accepts [place] Access Medicaid patients after they have their name applied to their card.  ° °Self-Pay (no insurance) in [place]: ° °Organization         Address  Phone   Notes  °Sickle Cell Patients, [facility] [address], [place] (336) 832-1970   °[facility] [address], [place] (336) 832-4400   °[facility] ° [address], [place] (336) 992-4800   °[facility]/Dr. Osei-Bonsu ° [address] [place] [address], [place] or [address], [place] (336) [phone] Phone number for both [place] and [place] locations is the same.  °[facility] [address], [place] (336) 299-0000   °[facility] [address] [place] [address], [place] or [address] (336) 852-7530 °(336) 878-2260   °[facility] [address], [place] (336) 350-1642, phone; (336) 294-5005, fax Sees patients 1st and 3rd Saturday of every month.  Must not qualify for public or private insurance (i.e. Medicaid, Medicare, [place] Health Choice, Veterans' Benefits) • Household income should be no more than 200% of the poverty level •The clinic cannot treat you if you are pregnant or think you are pregnant • Sexually transmitted diseases are not treated at the clinic.  ° ° °Dental Care: °Organization         Address  Phone  Notes  °[place] [facility] [address], [place] (336) 641-6152 Accepts children up to age 21 who are enrolled in Medicaid or [place] Health Choice; pregnant women with a Medicaid card; and children who have applied for Medicaid or [place] Health Choice, but were declined, whose parents can pay a reduced fee at time of service.  °[place]  [facility] [place]  [address], [place] (336) 641-7733 Accepts children up to age 21 who are enrolled in Medicaid or [place] Health Choice; pregnant women with a Medicaid card; and children who have applied for Medicaid or [place] Health Choice, but were declined, whose parents can pay a reduced fee at time of service.  °[facility] ° [address], [place] (336) 641-4533 Patients are seen by appointment only. Walk-ins are not accepted. [facility] will see patients 18 years of age and older. °Monday - Tuesday (8am-5pm) °Most Wednesdays (8:30-5pm) °$30 per visit, cash only  °[facility] ° [address], [place] (336) 641-4533 Patients are seen by appointment only. Walk-ins are not accepted. [facility] will see patients 18 years of age and older. °One   Wednesday Evening (Monthly: Volunteer Based).  $30 per visit, cash only  °[facility]  (919) 537-3737 for adults; Children under age 4, call Graduate Pediatric Dentistry at ([phone]) [phone]. Children aged 4-14, please call ([phone]) [phone] to request a pediatric application. ° Dental services are provided in all areas of dental care including fillings, crowns and bridges, complete and partial dentures, implants, gum treatment, root canals, and extractions. Preventive care is also provided. Treatment is provided to both adults and children. °Patients are selected via a lottery and there is often a waiting list. °  °[facility] [address], °[place] ° (336) 763-8833 [email or web address] °  °[facility] [address], [place], [place] (336)723-1848, Ext. 123 Second and Fourth Thursday of each month, opens at 6:30 AM; Clinic ends at 9 AM.  Patients are seen on a first-come first-served basis, and a limited number are seen during each clinic.  ° °[facility] ° [address], [place], [place] (336) 723-7904    Eligibility Requirements °You must have lived in [place], or [place] counties for at least the last three months. °  You cannot be eligible for state or federal sponsored healthcare insurance, including Veterans Administration, Medicaid, or Medicare. °  You generally cannot be eligible for healthcare insurance through your employer.  °  How to apply: °Eligibility screenings are held every Tuesday and Wednesday afternoon from 1:00 pm until 4:00 pm. You do not need an appointment for the interview!  °[facility] [address], [place], [place] 336-631-2330   °[facility]  336-342-8273   °[facility]  336-703-3100   °[facility]  336-570-6415   ° °Behavioral Health Resources in the Community: °Intensive Outpatient Programs °Organization         Address  Phone  Notes  °[facility] [address], [place], [place] 336-878-6098   °[facility] [address], [place], [place] 336-832-9800   °[facility] [address], [place], [place] ° 336-882-2125   °[facility] [address],  °[place], [place] 1-800-853-5163 or 336-641-4981   °Substance Abuse Resources °Organization         Address  Phone  Notes  °[facility]  336-882-2125   °[facility]  336-784-9470   °[facility]  336-285-9073   °[facility]  336-845-3988   °Residential & Outpatient Substance Abuse Program  1-800-659-3381   °Psychological Services °Organization         Address  Phone  Notes  °[facility]  336- 832-9600   °[facility]  336- 378-7881   °[facility] [address], [place] 1-800-853-5163 or 336-641-4981   ° °Mobile Crisis Teams °Organization         Address  Phone  Notes  °[facility], Mobile Crisis Care Unit  1-877-626-1772   °Assertive °[facility] ° [address]  [place], [place] 336-834-9664   °Sharon DeEsch [address] °[place] 336-554-5454   ° °Self-Help/Support Groups °Organization         Address  Phone             Notes  °[facility]  - variety of support groups  336- 373-1402 Call for more information  °Narcotics Anonymous (NA), [facility] [address], °[place]  2 meetings at this location  ° °  Residential Treatment Programs Organization         Address  Phone  Notes  [facility] [address],    [place]  [phone]-([phone]) [phone]   [facility]  [address], [place] [phone], [place], [place] [phone]   [facility] [address], [place] [phone] Admissions: 8am-3pm M-F  [facility] [address]    [place], [place] [phone](505)[phone]   [facility] [address], [place], [place] [phone]   [facility] [address]  [place], [place] [phone]   [facility] Intensive Outpatient [address], [place], [place] [phone]   [facility] [address]  [place], [place] [phone] or [phone]   [facility] [address] [place], [place] [phone] Accepts Medicaid  [facility] [address]  [place] [phone] Substance Abuse/Addiction Treatment   [facility]         Address  Phone  Notes  [facility]  234-039-3852(888) 8052628666   Angie FavaJulie Brannon, PhD [address], [address] [place], [place]   (930) [phone](336) [phone] or ([phone](336) [phone]   [facility] [place]   [address] [place], [place] [phone](336) [phone]   [facility] [address], [place], [place] [phone](336) [phone] Insurance/Medicaid/sponsorship through [facility] [address]                                    [place], [place] [phone](336) [phone] Therapy/tele-psych/case    [facility] [address]   [place], [place] [phone](336) [phone]    Dr. Lolly MustacheArfeen  [phone]) [phone]   [facility]  [facility] 1) [address], [place] 2) [address], [place] 3)  [address], [place] [phone]) [phone](336) [phone]  [phone](336) [phone]   [facility] Child Abuse Hotline (786) 726-3476(336) [phone] or [phone](336) [phone] (After Hours)      Take over the counter tylenol and ibuprofen (OR excedrin), and benadryl, as directed on packaging, with the prescription given to you today, as needed for headache.  Call your regular medical doctor today to schedule a follow up appointment within the next 2 to 3 days.  Return to the Emergency Department immediately sooner if worsening.

## 2015-08-12 ENCOUNTER — Encounter: Payer: Self-pay | Admitting: Physician Assistant

## 2015-08-12 ENCOUNTER — Ambulatory Visit: Payer: Self-pay | Admitting: Physician Assistant

## 2015-08-12 VITALS — BP 132/90 | HR 70 | Temp 97.5°F | Ht 66.5 in | Wt 238.8 lb

## 2015-08-12 DIAGNOSIS — E669 Obesity, unspecified: Secondary | ICD-10-CM

## 2015-08-12 DIAGNOSIS — F17210 Nicotine dependence, cigarettes, uncomplicated: Secondary | ICD-10-CM | POA: Insufficient documentation

## 2015-08-12 DIAGNOSIS — G43909 Migraine, unspecified, not intractable, without status migrainosus: Secondary | ICD-10-CM

## 2015-08-12 DIAGNOSIS — I10 Essential (primary) hypertension: Secondary | ICD-10-CM | POA: Insufficient documentation

## 2015-08-12 DIAGNOSIS — E785 Hyperlipidemia, unspecified: Secondary | ICD-10-CM

## 2015-08-12 DIAGNOSIS — E782 Mixed hyperlipidemia: Secondary | ICD-10-CM | POA: Insufficient documentation

## 2015-08-12 MED ORDER — LISINOPRIL-HYDROCHLOROTHIAZIDE 20-12.5 MG PO TABS: 1.0000 | ORAL_TABLET | Freq: Every day | ORAL | Status: DC

## 2015-08-12 MED ORDER — BUTALBITAL-APAP-CAFFEINE 50-325-40 MG PO TABS: 1.0000 | ORAL_TABLET | Freq: Four times a day (QID) | ORAL | Status: DC | PRN

## 2015-08-12 NOTE — Patient Instructions (Signed)
Smoking Cessation, Tips for Success If you are ready to quit smoking, congratulations! You have chosen to help yourself be healthier. Cigarettes bring nicotine, tar, carbon monoxide, and other irritants into your body. Your lungs, heart, and blood vessels will be able to work better without these poisons. There are many different ways to quit smoking. Nicotine gum, nicotine patches, a nicotine inhaler, or nicotine nasal spray can help with physical craving. Hypnosis, support groups, and medicines help break the habit of smoking. WHAT THINGS CAN I DO TO MAKE QUITTING EASIER?  Here are some tips to help you quit for good:  Pick a date when you will quit smoking completely. Tell all of your friends and family about your plan to quit on that date.  Do not try to slowly cut down on the number of cigarettes you are smoking. Pick a quit date and quit smoking completely starting on that day.  Throw away all cigarettes.   Clean and remove all ashtrays from your home, work, and car.  On a card, write down your reasons for quitting. Carry the card with you and read it when you get the urge to smoke.  Cleanse your body of nicotine. Drink enough water and fluids to keep your urine clear or pale yellow. Do this after quitting to flush the nicotine from your body.  Learn to predict your moods. Do not let a bad situation be your excuse to have a cigarette. Some situations in your life might tempt you into wanting a cigarette.  Never have "just one" cigarette. It leads to wanting another and another. Remind yourself of your decision to quit.  Change habits associated with smoking. If you smoked while driving or when feeling stressed, try other activities to replace smoking. Stand up when drinking your coffee. Brush your teeth after eating. Sit in a different chair when you read the paper. Avoid alcohol while trying to quit, and try to drink fewer caffeinated beverages. Alcohol and caffeine may urge you to  smoke.  Avoid foods and drinks that can trigger a desire to smoke, such as sugary or spicy foods and alcohol.  Ask people who smoke not to smoke around you.  Have something planned to do right after eating or having a cup of coffee. For example, plan to take a walk or exercise.  Try a relaxation exercise to calm you down and decrease your stress. Remember, you may be tense and nervous for the first 2 weeks after you quit, but this will pass.  Find new activities to keep your hands busy. Play with a pen, coin, or rubber band. Doodle or draw things on paper.  Brush your teeth right after eating. This will help cut down on the craving for the taste of tobacco after meals. You can also try mouthwash.   Use oral substitutes in place of cigarettes. Try using lemon drops, carrots, cinnamon sticks, or chewing gum. Keep them handy so they are available when you have the urge to smoke.  When you have the urge to smoke, try deep breathing.  Designate your home as a nonsmoking area.  If you are a heavy smoker, ask your health care provider about a prescription for nicotine chewing gum. It can ease your withdrawal from nicotine.  Reward yourself. Set aside the cigarette money you save and buy yourself something nice.  Look for support from others. Join a support group or smoking cessation program. Ask someone at home or at work to help you with your plan   to quit smoking.  Always ask yourself, "Do I need this cigarette or is this just a reflex?" Tell yourself, "Today, I choose not to smoke," or "I do not want to smoke." You are reminding yourself of your decision to quit.  Do not replace cigarette smoking with electronic cigarettes (commonly called e-cigarettes). The safety of e-cigarettes is unknown, and some may contain harmful chemicals.  If you relapse, do not give up! Plan ahead and think about what you will do the next time you get the urge to smoke. HOW WILL I FEEL WHEN I QUIT SMOKING? You  may have symptoms of withdrawal because your body is used to nicotine (the addictive substance in cigarettes). You may crave cigarettes, be irritable, feel very hungry, cough often, get headaches, or have difficulty concentrating. The withdrawal symptoms are only temporary. They are strongest when you first quit but will go away within 10-14 days. When withdrawal symptoms occur, stay in control. Think about your reasons for quitting. Remind yourself that these are signs that your body is healing and getting used to being without cigarettes. Remember that withdrawal symptoms are easier to treat than the major diseases that smoking can cause.  Even after the withdrawal is over, expect periodic urges to smoke. However, these cravings are generally short lived and will go away whether you smoke or not. Do not smoke! WHAT RESOURCES ARE AVAILABLE TO HELP ME QUIT SMOKING? Your health care provider can direct you to community resources or hospitals for support, which may include:  Group support.  Education.  Hypnosis.  Therapy.   This information is not intended to replace advice given to you by your health care provider. Make sure you discuss any questions you have with your health care provider.   Document Released: 06/12/2004 Document Revised: 10/05/2014 Document Reviewed: 03/02/2013 Elsevier Interactive Patient Education 2016 Elsevier Inc.  

## 2015-08-12 NOTE — Progress Notes (Signed)
BP 132/90 mmHg  Pulse 70  Temp(Src) 97.5 F (36.4 C)  Ht 5' 6.5" (1.689 m)  Wt 238 lb 12.8 oz (108.319 kg)  BMI 37.97 kg/m2  SpO2 99%   Subjective:    Patient ID: Dominique Harrington, female    DOB: April 17, 1971, 44 y.o.   MRN: [id number]  HPI: Dominique Harrington is a 44 y.o. female presenting on 08/12/2015 for Hypertension and Hyperlipidemia   HPI  Chief Complaint  Patient presents with  . Hypertension    pt forgot to get labs drawn. pt is currently fasting and states she can go today  . Hyperlipidemia     -Pt here today for HTN and Chol. She c/o HA today -Pt checks her bp at home. Usually 140/88-94 -Pt is continuing with MH treatment at [facility] -Pt states migraines 3-4 times/month. Pt did well with fioricet in the past   Relevant past medical, surgical, family and social history reviewed and updated as indicated. Interim medical history since our last visit reviewed. Allergies and medications reviewed and updated.   Current outpatient prescriptions:  .  acetaminophen (TYLENOL) 500 MG tablet, Take 1,000 mg by mouth daily as needed for headache., Disp: , Rfl:  .  busPIRone (BUSPAR) 10 MG tablet, Take 10 mg by mouth 2 (two) times daily., Disp: , Rfl:  .  citalopram (CELEXA) 20 MG tablet, Take 20 mg by mouth daily., Disp: , Rfl:  .  hydrOXYzine (ATARAX/VISTARIL) 25 MG tablet, Take 50 mg by mouth at bedtime. , Disp: , Rfl:  .  ibuprofen (ADVIL,MOTRIN) 200 MG tablet, Take 800 mg by mouth as needed for headache., Disp: , Rfl:  .  lisinopril-hydrochlorothiazide (PRINZIDE,ZESTORETIC) 10-12.5 MG tablet, Take 1 tablet by mouth daily., Disp: , Rfl:  .  lovastatin (MEVACOR) 20 MG tablet, Take 20 mg by mouth at bedtime., Disp: , Rfl:  .  metoprolol (LOPRESSOR) 100 MG tablet, Take 100 mg by mouth 2 (two) times daily., Disp: , Rfl:  .  Omega-3 Fatty Acids (FISH OIL PO), Take 1 capsule by mouth 2 (two) times daily. , Disp: , Rfl:    Review of Systems  Constitutional: Positive for appetite  change, fatigue and unexpected weight change. Negative for fever, chills and diaphoresis.  HENT: Negative for congestion, dental problem, drooling, ear pain, facial swelling, hearing loss, mouth sores, sneezing, sore throat, trouble swallowing and voice change.   Eyes: Negative for pain, discharge, redness, itching and visual disturbance.  Respiratory: Negative for cough, choking, shortness of breath and wheezing.   Cardiovascular: Negative for chest pain, palpitations and leg swelling.  Gastrointestinal: Negative for vomiting, abdominal pain, diarrhea, constipation and blood in stool.  Endocrine: Negative for cold intolerance, heat intolerance and polydipsia.  Genitourinary: Negative for dysuria, hematuria and decreased urine volume.  Musculoskeletal: Negative for back pain, arthralgias and gait problem.  Skin: Negative for rash.  Allergic/Immunologic: Negative for environmental allergies.  Neurological: Positive for light-headedness and headaches. Negative for seizures and syncope.  Hematological: Negative for adenopathy.  Psychiatric/Behavioral: Positive for dysphoric mood and agitation. Negative for suicidal ideas. The patient is nervous/anxious.     Per HPI unless specifically indicated above     Objective:    BP 132/90 mmHg  Pulse 70  Temp(Src) 97.5 F (36.4 C)  Ht 5' 6.5" (1.689 m)  Wt 238 lb 12.8 oz (108.319 kg)  BMI 37.97 kg/m2  SpO2 99%  Wt Readings from Last 3 Encounters:  08/12/15 238 lb 12.8 oz (108.319 kg)  07/31/15 220 lb (  99.791 kg)  06/07/14 230 lb (104.327 kg)    Physical Exam  Constitutional: She is oriented to person, place, and time. She appears well-developed and well-nourished.  HENT:  Head: Normocephalic and atraumatic.  Neck: Neck supple.  Cardiovascular: Normal rate and regular rhythm.   Pulmonary/Chest: Effort normal and breath sounds normal.  Abdominal: Soft. Bowel sounds are normal. She exhibits no mass. There is no tenderness.  Musculoskeletal:  She exhibits no edema.  Lymphadenopathy:    She has no cervical adenopathy.  Neurological: She is alert and oriented to person, place, and time.  Skin: Skin is warm and dry.  Psychiatric: She has a normal mood and affect. Her behavior is normal.  Vitals reviewed.   No results found for this or any previous visit.    Assessment & Plan:   Encounter Diagnoses  Name Primary?  . Essential hypertension, benign Yes  . Hyperlipemia   . Migraine without status migrainosus, not intractable, unspecified migraine type   . Cigarette nicotine dependence, uncomplicated   . Obesity, unspecified      -Pt will get fasting labs drawn today when leaves office.  Will call with results -Increase lisinopril/hctz to 20/12.5 -rx fioricet to use prn migraine -Counseled on  Smoking cessation -F/u 3 mo. rto sooner prn

## 2015-09-05 ENCOUNTER — Other Ambulatory Visit: Payer: Self-pay | Admitting: Physician Assistant

## 2015-09-10 ENCOUNTER — Other Ambulatory Visit: Payer: Self-pay | Admitting: Physician Assistant

## 2015-09-10 LAB — LIPID PANEL
CHOL/HDL RATIO: 6.6 ratio — AB (ref <=5.0)
Cholesterol: 198 mg/dL (ref 125–200)
HDL: 30 mg/dL — AB (ref >=46)
LDL CALC: 100 mg/dL (ref <130)
TRIGLYCERIDES: 340 mg/dL — AB (ref <150)
VLDL: 68 mg/dL — AB (ref <30)

## 2015-09-10 LAB — COMPREHENSIVE METABOLIC PANEL
ALBUMIN: 4.1 g/dL (ref 3.6–5.1)
ALT: 13 U/L (ref 6–29)
AST: 14 U/L (ref 10–35)
Alkaline Phosphatase: 71 U/L (ref 33–115)
BUN: 30 mg/dL — ABNORMAL HIGH (ref 7–25)
CALCIUM: 9.5 mg/dL (ref 8.6–10.2)
CHLORIDE: 106 mmol/L (ref 98–110)
CO2: 24 mmol/L (ref 20–31)
Creat: 1.39 mg/dL — ABNORMAL HIGH (ref 0.50–1.10)
GLUCOSE: 88 mg/dL (ref 65–99)
POTASSIUM: 4.6 mmol/L (ref 3.5–5.3)
Sodium: 139 mmol/L (ref 135–146)
Total Bilirubin: 0.4 mg/dL (ref 0.2–1.2)
Total Protein: 6.3 g/dL (ref 6.1–8.1)

## 2015-09-24 ENCOUNTER — Other Ambulatory Visit: Payer: Self-pay | Admitting: Physician Assistant

## 2015-10-04 ENCOUNTER — Other Ambulatory Visit: Payer: Self-pay | Admitting: Physician Assistant

## 2015-10-15 ENCOUNTER — Other Ambulatory Visit: Payer: Self-pay | Admitting: Physician Assistant

## 2015-10-15 MED ORDER — BUTALBITAL-APAP-CAFFEINE 50-300-40 MG PO CAPS: ORAL_CAPSULE | ORAL | Status: DC

## 2015-11-11 ENCOUNTER — Ambulatory Visit: Payer: Self-pay | Admitting: Physician Assistant

## 2015-11-11 ENCOUNTER — Encounter: Payer: Self-pay | Admitting: Physician Assistant

## 2015-11-11 VITALS — BP 122/88 | HR 85 | Temp 97.5°F | Ht 66.5 in | Wt 229.5 lb

## 2015-11-11 DIAGNOSIS — I10 Essential (primary) hypertension: Secondary | ICD-10-CM

## 2015-11-11 DIAGNOSIS — Z1239 Encounter for other screening for malignant neoplasm of breast: Secondary | ICD-10-CM

## 2015-11-11 DIAGNOSIS — E669 Obesity, unspecified: Secondary | ICD-10-CM | POA: Insufficient documentation

## 2015-11-11 DIAGNOSIS — E785 Hyperlipidemia, unspecified: Secondary | ICD-10-CM

## 2015-11-11 DIAGNOSIS — F17210 Nicotine dependence, cigarettes, uncomplicated: Secondary | ICD-10-CM

## 2015-11-11 DIAGNOSIS — G43909 Migraine, unspecified, not intractable, without status migrainosus: Secondary | ICD-10-CM

## 2015-11-11 MED ORDER — HYDROXYZINE HCL 25 MG PO TABS: 50.0000 mg | ORAL_TABLET | Freq: Every day | ORAL | Status: DC

## 2015-11-11 NOTE — Progress Notes (Signed)
BP 122/88 mmHg  Pulse 85  Temp(Src) 97.5 F (36.4 C)  Ht 5' 6.5" (1.689 m)  Wt 229 lb 8 oz (104.101 kg)  BMI 36.49 kg/m2  SpO2 97%   Subjective:    Patient ID: Dominique Harrington, female    DOB: 06/09/1971, 45 y.o.   MRN: [id number]  HPI: Dominique Harrington is a 45 y.o. female presenting on 11/11/2015 for Hypertension   HPI Pt stopped her lovastatin b/c she was confused with increasing her fish oil  Pt says that she also stopped her atarax and citalopram per [facility] b/c her MH is mpproving.  Pt states that migraines have increased since stopping the atarax (b/c she isn't sleeping well).  She is weaning herself off the buspar and is only going back to [facility] prn.  Relevant past medical, surgical, family and social history reviewed and updated as indicated. Interim medical history since our last visit reviewed. Allergies and medications reviewed and updated.   Current outpatient prescriptions:  .  acetaminophen (TYLENOL) 500 MG tablet, Take 1,000 mg by mouth daily as needed for headache., Disp: , Rfl:  .  busPIRone (BUSPAR) 10 MG tablet, Take 10 mg by mouth 2 (two) times daily., Disp: , Rfl:  .  Butalbital-APAP-Caffeine 50-300-40 MG CAPS, Take one to two tablets PO q 6 hours prn HA (max 6 / 24 hrs), Disp: 20 capsule, Rfl: 0 .  ibuprofen (ADVIL,MOTRIN) 200 MG tablet, Take 800 mg by mouth as needed for headache., Disp: , Rfl:  .  lisinopril-hydrochlorothiazide (ZESTORETIC) 20-12.5 MG tablet, Take 1 tablet by mouth daily., Disp: 30 tablet, Rfl: 3 .  metoprolol (LOPRESSOR) 100 MG tablet, TAKE ONE TABLET BY MOUTH TWICE DAILY FOR BLOOD PRESSURE, Disp: 60 tablet, Rfl: 2 .  Omega-3 Fatty Acids (FISH OIL PO), Take 2,000 mg by mouth 2 (two) times daily. , Disp: , Rfl:  .  hydrOXYzine (ATARAX/VISTARIL) 25 MG tablet, Take 50 mg by mouth at bedtime. Reported on 11/11/2015, Disp: , Rfl:  .  lovastatin (MEVACOR) 20 MG tablet, Take 20 mg by mouth at bedtime. Reported on 11/11/2015, Disp: , Rfl:     Review of Systems  Constitutional: Positive for appetite change and unexpected weight change. Negative for fever, chills, diaphoresis and fatigue.  HENT: Positive for congestion and sneezing. Negative for dental problem, drooling, ear pain, facial swelling, hearing loss, mouth sores, sore throat, trouble swallowing and voice change.   Eyes: Negative for pain, discharge, redness, itching and visual disturbance.  Respiratory: Positive for cough and shortness of breath. Negative for choking and wheezing.   Cardiovascular: Negative for chest pain, palpitations and leg swelling.  Gastrointestinal: Negative for vomiting, abdominal pain, diarrhea, constipation and blood in stool.  Endocrine: Positive for polydipsia. Negative for cold intolerance and heat intolerance.  Genitourinary: Negative for dysuria, hematuria and decreased urine volume.  Musculoskeletal: Positive for back pain. Negative for arthralgias and gait problem.  Skin: Negative for rash.  Allergic/Immunologic: Negative for environmental allergies.  Neurological: Positive for headaches. Negative for seizures, syncope and light-headedness.  Hematological: Negative for adenopathy.  Psychiatric/Behavioral: Positive for dysphoric mood and agitation. Negative for suicidal ideas. The patient is nervous/anxious.     Per HPI unless specifically indicated above     Objective:    BP 122/88 mmHg  Pulse 85  Temp(Src) 97.5 F (36.4 C)  Ht 5' 6.5" (1.689 m)  Wt 229 lb 8 oz (104.101 kg)  BMI 36.49 kg/m2  SpO2 97%  Wt Readings from Last 3 Encounters:  11/11/15 229 lb 8 oz (104.101 kg)  08/12/15 238 lb 12.8 oz (108.319 kg)  07/31/15 220 lb (99.791 kg)    Physical Exam  Constitutional: She is oriented to person, place, and time. She appears well-developed and well-nourished.  HENT:  Head: Normocephalic and atraumatic.  Neck: Neck supple.  Cardiovascular: Normal rate and regular rhythm.   Pulmonary/Chest: Effort normal and breath  sounds normal.  Abdominal: Soft. Bowel sounds are normal. She exhibits no mass. There is no hepatosplenomegaly. There is no tenderness.  Musculoskeletal: She exhibits no edema.  Lymphadenopathy:    She has no cervical adenopathy.  Neurological: She is alert and oriented to person, place, and time.  Skin: Skin is warm and dry.  Psychiatric: She has a normal mood and affect. Her behavior is normal.  Vitals reviewed.   Results for orders placed or performed in visit on 09/10/15  Comprehensive metabolic panel  Result Value Ref Range   Sodium 139 135 - 146 mmol/L   Potassium 4.6 3.5 - 5.3 mmol/L   Chloride 106 98 - 110 mmol/L   CO2 24 20 - 31 mmol/L   Glucose, Bld 88 65 - 99 mg/dL   BUN 30 (H) 7 - 25 mg/dL   Creat 1.61 (H) 0.96 - 1.10 mg/dL   Total Bilirubin 0.4 0.2 - 1.2 mg/dL   Alkaline Phosphatase 71 33 - 115 U/L   AST 14 10 - 35 U/L   ALT 13 6 - 29 U/L   Total Protein 6.3 6.1 - 8.1 g/dL   Albumin 4.1 3.6 - 5.1 g/dL   Calcium 9.5 8.6 - 04.5 mg/dL  Lipid panel  Result Value Ref Range   Cholesterol 198 125 - 200 mg/dL   Triglycerides 409 (H) <150 mg/dL   HDL 30 (L) >=81 mg/dL   Total CHOL/HDL Ratio 6.6 (H) <=5.0 Ratio   VLDL 68 (H) <30 mg/dL   LDL Cholesterol 191 <478 mg/dL      Assessment & Plan:   Encounter Diagnoses  Name Primary?  . Essential hypertension, benign Yes  . Screening for breast cancer   . Hyperlipidemia   . Cigarette nicotine dependence, uncomplicated   . Obesity, unspecified   . Migraine without status migrainosus, not intractable, unspecified migraine type      -Restart the lovastatin -Continue fish oil -Will rx atarax for pt to continue -recommend [facility] -order mammogram for - after 3/24 -F/u 3 mo- future labs -counseled on Smoking cessation -Continue current bp meds

## 2015-11-14 ENCOUNTER — Other Ambulatory Visit: Payer: Self-pay | Admitting: Physician Assistant

## 2015-11-27 ENCOUNTER — Other Ambulatory Visit: Payer: Self-pay | Admitting: Physician Assistant

## 2015-12-09 ENCOUNTER — Ambulatory Visit (HOSPITAL_COMMUNITY): Payer: Self-pay

## 2015-12-23 ENCOUNTER — Ambulatory Visit (HOSPITAL_COMMUNITY): Payer: Self-pay

## 2015-12-27 ENCOUNTER — Other Ambulatory Visit: Payer: Self-pay | Admitting: Physician Assistant

## 2016-01-07 ENCOUNTER — Other Ambulatory Visit: Payer: Self-pay | Admitting: Physician Assistant

## 2016-01-26 ENCOUNTER — Emergency Department (HOSPITAL_COMMUNITY)
Admission: EM | Admit: 2016-01-26 | Discharge: 2016-01-26 | Disposition: A | Discharge status: Home / Self Care | Payer: Self-pay | Attending: Emergency Medicine | Admitting: Emergency Medicine

## 2016-01-26 ENCOUNTER — Encounter (HOSPITAL_COMMUNITY): Payer: Self-pay | Admitting: Emergency Medicine

## 2016-01-26 DIAGNOSIS — G43009 Migraine without aura, not intractable, without status migrainosus: Secondary | ICD-10-CM

## 2016-01-26 DIAGNOSIS — Z79899 Other long term (current) drug therapy: Secondary | ICD-10-CM | POA: Insufficient documentation

## 2016-01-26 DIAGNOSIS — F17210 Nicotine dependence, cigarettes, uncomplicated: Secondary | ICD-10-CM | POA: Insufficient documentation

## 2016-01-26 DIAGNOSIS — Z791 Long term (current) use of non-steroidal anti-inflammatories (NSAID): Secondary | ICD-10-CM | POA: Insufficient documentation

## 2016-01-26 DIAGNOSIS — G43909 Migraine, unspecified, not intractable, without status migrainosus: Secondary | ICD-10-CM | POA: Insufficient documentation

## 2016-01-26 DIAGNOSIS — I10 Essential (primary) hypertension: Secondary | ICD-10-CM | POA: Insufficient documentation

## 2016-01-26 MED ORDER — BUTALBITAL-APAP-CAFFEINE 50-325-40 MG PO TABS: 1.0000 | ORAL_TABLET | Freq: Four times a day (QID) | ORAL | Status: DC | PRN

## 2016-01-26 MED ORDER — KETOROLAC TROMETHAMINE 30 MG/ML IJ SOLN
30.0000 mg | Freq: Once | INTRAMUSCULAR | Status: AC
  Administered 2016-01-26: 30 mg via INTRAVENOUS
  Filled 2016-01-26: qty 1

## 2016-01-26 MED ORDER — PROCHLORPERAZINE EDISYLATE 5 MG/ML IJ SOLN
10.0000 mg | Freq: Once | INTRAMUSCULAR | Status: AC
Start: 2016-01-26 — End: 2016-01-26
  Administered 2016-01-26: 10 mg via INTRAVENOUS
  Filled 2016-01-26: qty 2

## 2016-01-26 MED ORDER — DIPHENHYDRAMINE HCL 50 MG/ML IJ SOLN
25.0000 mg | Freq: Once | INTRAMUSCULAR | Status: AC
  Administered 2016-01-26: 25 mg via INTRAVENOUS
  Filled 2016-01-26: qty 1

## 2016-01-26 NOTE — Discharge Instructions (Signed)
Your vital signs are within normal limits. Your exam is negative for acute neurologic problem. Your were treated with IV medications,  Use caution getting around today. Use your fioricet for break through headaches.

## 2016-01-26 NOTE — ED Provider Notes (Signed)
CSN: [id number]     Arrival date & time 01/26/16  1023 History  By signing my name below, I, Evon Slackerrance Branch, attest that this documentation has been prepared under the direction and in the presence of Ivery QualeHobson Kinnley Paulson, PA-C. Electronically Signed: Evon Slackerrance Branch, ED Scribe. 01/26/2016. 11:35 AM.      Chief Complaint  Patient presents with  . Migraine    Patient is a 45 y.o. female presenting with headaches. The history is provided by the patient. No language interpreter was used.  Headache Pain location:  Frontal, L temporal, R temporal and occipital Duration:  2 days Similar to prior headaches: yes   Context: emotional stress   Relieved by:  Nothing Worsened by:  Light Ineffective treatments:  Prescription medications Associated symptoms: nausea, photophobia and vomiting   Associated symptoms: no fever, no neck pain and no neck stiffness    HPI Comments: Dominique Harrington is a 45 y.o. female who presents to the Emergency Department complaining of migraine HA onset 2 days prior. Pt states that this a  frontal HA that radiates to her temporals and the base of her neck. Pt reports associated nausea, vomiting x3 and photophobia. She states that bright light makes her HA worse. Pt states she has tried Fioricet with no relief. Pt states that this feels like her usual migraine HA's. She states that her migraine are usually brought on from stress and her menstrual cycle. Pt denies fever or neck stiffness.      Past Medical History  Diagnosis Date  . Hypertension   . Migraines   . Anxiety   . Tachycardia   . Dyspnea on exertion 2013    Echo, EF =>55%   Past Surgical History  Procedure Laterality Date  . Tubal ligation  2004  . Back surgery    . Cosmetic surgery    . Hernia repair     Family History  Problem Relation Age of Onset  . Heart disease Father    Social History  Substance Use Topics  . Smoking status: Current Every Day Smoker -- 0.50 packs/day for 26 years    Types:  Cigarettes  . Smokeless tobacco: Never Used  . Alcohol Use: No   OB History    Gravida Para Term Preterm AB TAB SAB Ectopic Multiple Living   2 2 2       2      Review of Systems  Constitutional: Negative for fever.  Eyes: Positive for photophobia.  Gastrointestinal: Positive for nausea and vomiting.  Musculoskeletal: Negative for neck pain and neck stiffness.  Neurological: Positive for headaches.  All other systems reviewed and are negative.     Allergies  Sulfa antibiotics  Home Medications   Prior to Admission medications   Medication Sig Start Date End Date Taking? Authorizing Provider  acetaminophen (TYLENOL) 500 MG tablet Take 1,000 mg by mouth daily as needed for headache.   Yes Historical Provider, MD  busPIRone (BUSPAR) 10 MG tablet Take 10 mg by mouth 2 (two) times daily.   Yes Historical Provider, MD  butalbital-acetaminophen-caffeine (FIORICET, ESGIC) 50-325-40 MG tablet TAKE ONE TO TWO TABLETS BY MOUTH EVERY 6 HOURS AS NEEDED FOR HEADACHE **MAX  OF  6  TABLETS  IN  24  HOURS** 12/30/15  Yes Jacquelin HawkingShannon McElroy, PA-C  hydrOXYzine (VISTARIL) 50 MG capsule Take 100 mg by mouth at bedtime.   Yes Historical Provider, MD  ibuprofen (ADVIL,MOTRIN) 200 MG tablet Take 800 mg by mouth as needed for headache.  Yes Historical Provider, MD  lisinopril-hydrochlorothiazide (ZESTORETIC) 20-12.5 MG tablet Take 1 tablet by mouth daily. 08/12/15  Yes Jacquelin Hawking, PA-C  lovastatin (MEVACOR) 20 MG tablet Take 20 mg by mouth at bedtime. Reported on 11/11/2015   Yes Historical Provider, MD  metoprolol (LOPRESSOR) 100 MG tablet TAKE ONE TABLET BY MOUTH TWICE DAILY FOR BLOOD PRESSURE 01/07/16  Yes Jacquelin Hawking, PA-C  Omega-3 Fatty Acids (FISH OIL PO) Take 2,000 mg by mouth 2 (two) times daily.    Yes Historical Provider, MD   BP 134/91 mmHg  Pulse 69  Temp(Src) 98.1 F (36.7 C) (Oral)  Resp 18  Ht  (1.702 m)  Wt 230 lb (104.327 kg)  BMI 36.01 kg/m2  SpO2 100%  LMP 12/19/2014    Physical Exam  Constitutional: She is oriented to person, place, and time. She appears well-developed and well-nourished. No distress.  HENT:  Head: Normocephalic and atraumatic.  Eyes: Conjunctivae and EOM are normal.  Neck: Neck supple. No tracheal deviation present.  Cardiovascular: Normal rate.   Pulmonary/Chest: Effort normal. No respiratory distress.  Coarse rhonchi at the left lung that cleared with cough   Musculoskeletal: Normal range of motion.  Neurological: She is alert and oriented to person, place, and time. She has normal strength. She displays no tremor. No cranial nerve deficit or sensory deficit. Coordination normal.  Able to raise soft palate with out problem, grip is symmetrical, speech is clear and understandable   Skin: Skin is warm and dry.  Psychiatric: She has a normal mood and affect. Her behavior is normal.  Nursing note and vitals reviewed.   ED Course  Procedures (including critical care time) DIAGNOSTIC STUDIES: Oxygen Saturation is 100% on RA, normal by my interpretation.    COORDINATION OF CARE: 11:23 AM-Discussed treatment plan which includes IV benadryl, Toradol and compazine for HA and nausea with pt at bedside and pt agreed to plan.    Labs Review Labs Reviewed - No data to display  Imaging Review No results found.    EKG Interpretation None      MDM  No gross neuro deficits. No acute or emergent findings at this time. Pt responded nicely to IV medications. Rx for fioricet given to the patient. Pt to follow up with pcp.   Final diagnoses:  Nonintractable migraine, unspecified migraine type      **I personally performed the services described in this documentation, which was scribed in my presence. The recorded information has been reviewed and is accurate. I have reviewed nursing notes, vital signs, and all appropriate lab and imaging results for this patient.      Ivery Quale, PA-C 01/29/16 1114  Eber Hong,  MD 01/30/16 [phone]

## 2016-01-26 NOTE — ED Notes (Signed)
Patient c/o migraine headache that started Friday and is getting progressively worse. Per patient nausea and vomiting starting this morning at 6. Patient reports hx. Sensitivity to light and sound. Per patient taking Fioricet with no relief.

## 2016-01-26 NOTE — ED Notes (Signed)
Pt left ED, ambulartory, with no signs of distress. Pt verbalizes discharge instructions.

## 2016-02-04 ENCOUNTER — Other Ambulatory Visit: Payer: Self-pay

## 2016-02-04 DIAGNOSIS — I10 Essential (primary) hypertension: Secondary | ICD-10-CM

## 2016-02-04 DIAGNOSIS — E785 Hyperlipidemia, unspecified: Secondary | ICD-10-CM

## 2016-02-10 ENCOUNTER — Ambulatory Visit: Payer: Self-pay | Admitting: Physician Assistant

## 2016-02-11 ENCOUNTER — Ambulatory Visit: Payer: Self-pay | Admitting: Physician Assistant

## 2016-02-17 ENCOUNTER — Encounter: Payer: Self-pay | Admitting: Physician Assistant

## 2016-02-27 ENCOUNTER — Ambulatory Visit: Payer: Self-pay | Admitting: Physician Assistant

## 2016-02-27 ENCOUNTER — Encounter: Payer: Self-pay | Admitting: Physician Assistant

## 2016-02-27 VITALS — BP 126/92 | HR 72 | Temp 97.7°F | Ht 66.5 in | Wt 236.6 lb

## 2016-02-27 DIAGNOSIS — G43909 Migraine, unspecified, not intractable, without status migrainosus: Secondary | ICD-10-CM

## 2016-02-27 DIAGNOSIS — E669 Obesity, unspecified: Secondary | ICD-10-CM

## 2016-02-27 DIAGNOSIS — F17210 Nicotine dependence, cigarettes, uncomplicated: Secondary | ICD-10-CM

## 2016-02-27 DIAGNOSIS — I10 Essential (primary) hypertension: Secondary | ICD-10-CM

## 2016-02-27 DIAGNOSIS — E785 Hyperlipidemia, unspecified: Secondary | ICD-10-CM

## 2016-02-27 DIAGNOSIS — Z1239 Encounter for other screening for malignant neoplasm of breast: Secondary | ICD-10-CM

## 2016-02-27 MED ORDER — HYDROXYZINE PAMOATE 50 MG PO CAPS: 50.0000 mg | ORAL_CAPSULE | Freq: Every evening | ORAL | Status: DC | PRN

## 2016-02-27 MED ORDER — LOVASTATIN 20 MG PO TABS: 20.0000 mg | ORAL_TABLET | Freq: Every day | ORAL | Status: DC

## 2016-02-27 MED ORDER — BUTALBITAL-APAP-CAFFEINE 50-325-40 MG PO TABS: 1.0000 | ORAL_TABLET | Freq: Four times a day (QID) | ORAL | Status: DC | PRN

## 2016-02-27 NOTE — Progress Notes (Signed)
BP 126/92 mmHg  Pulse 72  Temp(Src) 97.7 F (36.5 C)  Ht 5' 6.5" (1.689 m)  Wt 236 lb 9.6 oz (107.321 kg)  BMI 37.62 kg/m2  SpO2 98%  LMP 12/19/2014   Subjective:    Patient ID: Dominique Harrington, female    DOB: Jan 16, 1971, 45 y.o.   MRN: [id number]  HPI: Dominique HasteCarrie P Pyon is a 45 y.o. female presenting on 02/27/2016 for Follow-up   HPI   [facility]  She feels like she doesn't need it now.  Pt did not get labs drawn  Relevant past medical, surgical, family and social history reviewed and updated as indicated. Interim medical history since our last visit reviewed. Allergies and medications reviewed and updated.   Current outpatient prescriptions:  .  butalbital-acetaminophen-caffeine (FIORICET) 50-325-40 MG tablet, Take 1-2 tablets by mouth every 6 (six) hours as needed for headache., Disp: 20 tablet, Rfl: 0 .  hydrOXYzine (VISTARIL) 50 MG capsule, Take 100 mg by mouth at bedtime. Reported on 02/27/2016, Disp: , Rfl:  .  lisinopril-hydrochlorothiazide (ZESTORETIC) 20-12.5 MG tablet, Take 1 tablet by mouth daily., Disp: 30 tablet, Rfl: 3 .  lovastatin (MEVACOR) 20 MG tablet, Take 20 mg by mouth at bedtime. Reported on 11/11/2015, Disp: , Rfl:  .  metoprolol (LOPRESSOR) 100 MG tablet, TAKE ONE TABLET BY MOUTH TWICE DAILY FOR BLOOD PRESSURE, Disp: 60 tablet, Rfl: 2 .  Omega-3 Fatty Acids (FISH OIL PO), Take 2,000 mg by mouth 2 (two) times daily. , Disp: , Rfl:    Review of Systems  Constitutional: Positive for fatigue. Negative for fever, chills, diaphoresis, appetite change and unexpected weight change.  HENT: Negative for congestion, dental problem, drooling, ear pain, facial swelling, hearing loss, mouth sores, sneezing, sore throat, trouble swallowing and voice change.   Eyes: Negative for pain, discharge, redness, itching and visual disturbance.  Respiratory: Negative for cough, choking, shortness of breath and wheezing.   Cardiovascular: Negative for  chest pain, palpitations and leg swelling.  Gastrointestinal: Negative for vomiting, abdominal pain, diarrhea, constipation and blood in stool.  Endocrine: Negative for cold intolerance, heat intolerance and polydipsia.  Genitourinary: Negative for dysuria, hematuria and decreased urine volume.  Musculoskeletal: Negative for back pain, arthralgias and gait problem.  Skin: Negative for rash.  Allergic/Immunologic: Negative for environmental allergies.  Neurological: Positive for headaches. Negative for seizures, syncope and light-headedness.  Hematological: Negative for adenopathy.  Psychiatric/Behavioral: Positive for dysphoric mood. Negative for suicidal ideas and agitation. The patient is nervous/anxious.     Per HPI unless specifically indicated above     Objective:    BP 126/92 mmHg  Pulse 72  Temp(Src) 97.7 F (36.5 C)  Ht 5' 6.5" (1.689 m)  Wt 236 lb 9.6 oz (107.321 kg)  BMI 37.62 kg/m2  SpO2 98%  LMP 12/19/2014  Wt Readings from Last 3 Encounters:  02/27/16 236 lb 9.6 oz (107.321 kg)  01/26/16 230 lb (104.327 kg)  11/11/15 229 lb 8 oz (104.101 kg)    Physical Exam  Constitutional: She is oriented to person, place, and time. She appears well-developed and well-nourished.  HENT:  Head: Normocephalic and atraumatic.  Neck: Neck supple.  Cardiovascular: Normal rate and regular rhythm.   Pulmonary/Chest: Effort normal and breath sounds normal.  Abdominal: Soft. Bowel sounds are normal. She exhibits no mass. There is no hepatosplenomegaly. There is no tenderness.  Musculoskeletal: She exhibits no edema.  Lymphadenopathy:    She has no cervical adenopathy.  Neurological:  She is alert and oriented to person, place, and time.  Skin: Skin is warm and dry.  Psychiatric: She has a normal mood and affect. Her behavior is normal.  Vitals reviewed.       Assessment & Plan:    Encounter Diagnoses  Name Primary?  . Essential hypertension, benign Yes  . Hyperlipidemia    . Migraine without status migrainosus, not intractable, unspecified migraine type   . Obesity, unspecified   . Cigarette nicotine dependence, uncomplicated   . Screening for breast cancer     -order Mammogram -Pt got pap at [facility]- record request sent -pt to get fasting labs drawn -counseled on smoking cessation -F/u 3 months.  RTO sooner prn

## 2016-03-11 ENCOUNTER — Encounter: Payer: Self-pay | Admitting: Physician Assistant

## 2016-03-12 ENCOUNTER — Other Ambulatory Visit: Payer: Self-pay | Admitting: Physician Assistant

## 2016-03-12 ENCOUNTER — Encounter (HOSPITAL_COMMUNITY): Payer: Self-pay

## 2016-03-12 ENCOUNTER — Ambulatory Visit (HOSPITAL_COMMUNITY): Admission: RE | Admit: 2016-03-12 | Payer: Self-pay | Source: Ambulatory Visit

## 2016-03-12 ENCOUNTER — Ambulatory Visit (HOSPITAL_COMMUNITY)
Admission: RE | Admit: 2016-03-12 | Discharge: 2016-03-12 | Disposition: A | Discharge status: Home / Self Care | Payer: Self-pay | Source: Ambulatory Visit | Attending: Physician Assistant | Admitting: Physician Assistant

## 2016-03-12 ENCOUNTER — Other Ambulatory Visit (HOSPITAL_COMMUNITY): Payer: Self-pay | Admitting: *Deleted

## 2016-03-12 DIAGNOSIS — Z1231 Encounter for screening mammogram for malignant neoplasm of breast: Secondary | ICD-10-CM

## 2016-03-13 LAB — COMPLETE METABOLIC PANEL WITH GFR
ALBUMIN: 4.1 g/dL (ref 3.6–5.1)
ALK PHOS: 73 U/L (ref 33–115)
ALT: 13 U/L (ref 6–29)
AST: 14 U/L (ref 10–35)
BILIRUBIN TOTAL: 0.3 mg/dL (ref 0.2–1.2)
BUN: 14 mg/dL (ref 7–25)
CALCIUM: 9.4 mg/dL (ref 8.6–10.2)
CO2: 21 mmol/L (ref 20–31)
CREATININE: 0.87 mg/dL (ref 0.50–1.10)
Chloride: 105 mmol/L (ref 98–110)
GFR, Est African American: >89 mL/min (ref >=60)
GFR, Est Non African American: 80 mL/min (ref >=60)
GLUCOSE: 103 mg/dL — AB (ref 65–99)
Potassium: 4.6 mmol/L (ref 3.5–5.3)
Sodium: 141 mmol/L (ref 135–146)
TOTAL PROTEIN: 6.3 g/dL (ref 6.1–8.1)

## 2016-03-13 LAB — LIPID PANEL
Cholesterol: 214 mg/dL — ABNORMAL HIGH (ref 125–200)
HDL: 41 mg/dL — AB (ref >=46)
LDL CALC: 126 mg/dL (ref <130)
TRIGLYCERIDES: 235 mg/dL — AB (ref <150)
Total CHOL/HDL Ratio: 5.2 Ratio — ABNORMAL HIGH (ref <=5.0)
VLDL: 47 mg/dL — AB (ref <30)

## 2016-04-28 ENCOUNTER — Other Ambulatory Visit: Payer: Self-pay | Admitting: Physician Assistant

## 2016-06-03 ENCOUNTER — Other Ambulatory Visit: Payer: Self-pay | Admitting: Physician Assistant

## 2016-06-22 ENCOUNTER — Other Ambulatory Visit: Payer: Self-pay

## 2016-06-22 DIAGNOSIS — E785 Hyperlipidemia, unspecified: Secondary | ICD-10-CM

## 2016-06-22 DIAGNOSIS — I10 Essential (primary) hypertension: Secondary | ICD-10-CM

## 2016-06-29 ENCOUNTER — Encounter: Payer: Self-pay | Admitting: Physician Assistant

## 2016-06-29 ENCOUNTER — Ambulatory Visit: Payer: Self-pay | Admitting: Physician Assistant

## 2016-06-29 VITALS — BP 146/84 | HR 60 | Temp 97.9°F | Ht 66.5 in | Wt 257.2 lb

## 2016-06-29 DIAGNOSIS — F17210 Nicotine dependence, cigarettes, uncomplicated: Secondary | ICD-10-CM

## 2016-06-29 DIAGNOSIS — J069 Acute upper respiratory infection, unspecified: Secondary | ICD-10-CM

## 2016-06-29 DIAGNOSIS — R062 Wheezing: Secondary | ICD-10-CM

## 2016-06-29 DIAGNOSIS — I10 Essential (primary) hypertension: Secondary | ICD-10-CM

## 2016-06-29 DIAGNOSIS — G43909 Migraine, unspecified, not intractable, without status migrainosus: Secondary | ICD-10-CM

## 2016-06-29 DIAGNOSIS — E785 Hyperlipidemia, unspecified: Secondary | ICD-10-CM

## 2016-06-29 MED ORDER — ALBUTEROL SULFATE (2.5 MG/3ML) 0.083% IN NEBU
2.5000 mg | INHALATION_SOLUTION | Freq: Once | RESPIRATORY_TRACT | Status: AC
  Administered 2016-06-29: 2.5 mg via RESPIRATORY_TRACT

## 2016-06-29 NOTE — Progress Notes (Signed)
BP (!) 146/84 (BP Location: Left Arm, Patient Position: Sitting, Cuff Size: Normal)   Pulse 60   Temp 97.9 F (36.6 C)   Ht 5' 6.5" (1.689 m)   Wt 257 lb 3.2 oz (116.7 kg)   LMP 12/19/2014   SpO2 95%   BMI 40.89 kg/m    Subjective:    Patient ID: Dominique Harrington, female    DOB: 20-Mar-1971, 45 y.o.   MRN: [id number]  HPI: Dominique Harrington is a 45 y.o. female presenting on 06/29/2016 for Hypertension and Hyperlipidemia   HPI   Pt has had cold symptoms started last Wednesday.  She started some leftover prednisone that she had from some previous time.  She is still smoking.  She says she got her labs drawn Friday afternoon.  Relevant past medical, surgical, family and social history reviewed and updated as indicated. Interim medical history since our last visit reviewed. Allergies and medications reviewed and updated.   Current Outpatient Prescriptions:  .  butalbital-acetaminophen-caffeine (FIORICET, ESGIC) 50-325-40 MG tablet, TAKE ONE TO TWO TABLETS BY MOUTH EVERY 6 HOURS AS NEEDED FOR HEADACHE, Disp: 20 tablet, Rfl: 0 .  hydrOXYzine (VISTARIL) 50 MG capsule, TAKE ONE CAPSULE BY MOUTH AT BEDTIME AS NEEDED, Disp: 30 capsule, Rfl: 2 .  lisinopril-hydrochlorothiazide (ZESTORETIC) 20-12.5 MG tablet, Take 1 tablet by mouth daily., Disp: 30 tablet, Rfl: 3 .  lovastatin (MEVACOR) 20 MG tablet, Take 1 tablet (20 mg total) by mouth at bedtime., Disp: 30 tablet, Rfl: 0 .  metoprolol (LOPRESSOR) 100 MG tablet, TAKE ONE TABLET BY MOUTH TWICE DAILY FOR BLOOD PRESSURE, Disp: 60 tablet, Rfl: 2 .  Omega-3 Fatty Acids (FISH OIL PO), Take 2,000 mg by mouth 2 (two) times daily. , Disp: , Rfl:  .  PREDNISONE PO, Take by mouth., Disp: , Rfl:    Review of Systems  Constitutional: Positive for fatigue. Negative for appetite change, chills, diaphoresis, fever and unexpected weight change.  HENT: Positive for congestion, sneezing and sore throat. Negative for dental problem, drooling, ear pain, facial  swelling, hearing loss, mouth sores, trouble swallowing and voice change.   Eyes: Negative for pain, discharge, redness, itching and visual disturbance.  Respiratory: Positive for cough and wheezing. Negative for choking and shortness of breath.   Cardiovascular: Negative for chest pain, palpitations and leg swelling.  Gastrointestinal: Negative for abdominal pain, blood in stool, constipation, diarrhea and vomiting.  Endocrine: Negative for cold intolerance, heat intolerance and polydipsia.  Genitourinary: Negative for decreased urine volume, dysuria and hematuria.  Musculoskeletal: Negative for arthralgias, back pain and gait problem.  Skin: Negative for rash.  Allergic/Immunologic: Negative for environmental allergies.  Neurological: Positive for headaches. Negative for seizures, syncope and light-headedness.  Hematological: Negative for adenopathy.  Psychiatric/Behavioral: Positive for agitation. Negative for dysphoric mood and suicidal ideas. The patient is nervous/anxious.     Per HPI unless specifically indicated above     Objective:    BP (!) 146/84 (BP Location: Left Arm, Patient Position: Sitting, Cuff Size: Normal)   Pulse 60   Temp 97.9 F (36.6 C)   Ht 5' 6.5" (1.689 m)   Wt 257 lb 3.2 oz (116.7 kg)   LMP 12/19/2014   SpO2 95%   BMI 40.89 kg/m   Wt Readings from Last 3 Encounters:  06/29/16 257 lb 3.2 oz (116.7 kg)  02/27/16 236 lb 9.6 oz (107.3 kg)  01/26/16 230 lb (104.3 kg)    Physical Exam  Constitutional: She is oriented to person, place, and  time. She appears well-developed and well-nourished.  HENT:  Head: Normocephalic and atraumatic.  Neck: Neck supple.  Cardiovascular: Normal rate and regular rhythm.   Pulmonary/Chest: Effort normal and breath sounds normal.  Abdominal: Soft. Bowel sounds are normal. She exhibits no mass. There is no hepatosplenomegaly. There is no tenderness.  Musculoskeletal: She exhibits no edema.  Lymphadenopathy:    She has no  cervical adenopathy.  Neurological: She is alert and oriented to person, place, and time.  Skin: Skin is warm and dry.  Psychiatric: She has a normal mood and affect. Her behavior is normal.  Vitals reviewed.       Assessment & Plan:    Encounter Diagnoses  Name Primary?  . Acute upper respiratory infection Yes  . Wheezing   . Hyperlipidemia, unspecified hyperlipidemia type   . Essential hypertension, benign   . Migraine without status migrainosus, not intractable, unspecified migraine type   . Cigarette nicotine dependence, uncomplicated   . Morbid obesity (HCC)     -Pt to get labs drawn today (lab says they have no blood). Will call with results -she is to use Use nebs and prednisone that she has. No smoking -will Monitor bp -F/u 3 months.  RTO sooner prn

## 2016-08-13 ENCOUNTER — Encounter (HOSPITAL_COMMUNITY): Payer: Self-pay | Admitting: Emergency Medicine

## 2016-08-13 ENCOUNTER — Emergency Department (HOSPITAL_COMMUNITY)
Admission: EM | Admit: 2016-08-13 | Discharge: 2016-08-13 | Disposition: A | Discharge status: Home / Self Care | Payer: Self-pay | Attending: Emergency Medicine | Admitting: Emergency Medicine

## 2016-08-13 DIAGNOSIS — I10 Essential (primary) hypertension: Secondary | ICD-10-CM | POA: Insufficient documentation

## 2016-08-13 DIAGNOSIS — G43009 Migraine without aura, not intractable, without status migrainosus: Secondary | ICD-10-CM | POA: Insufficient documentation

## 2016-08-13 DIAGNOSIS — F17210 Nicotine dependence, cigarettes, uncomplicated: Secondary | ICD-10-CM | POA: Insufficient documentation

## 2016-08-13 DIAGNOSIS — Z79899 Other long term (current) drug therapy: Secondary | ICD-10-CM | POA: Insufficient documentation

## 2016-08-13 MED ORDER — DIPHENHYDRAMINE HCL 50 MG/ML IJ SOLN
25.0000 mg | Freq: Once | INTRAMUSCULAR | Status: AC
  Administered 2016-08-13: 25 mg via INTRAVENOUS
  Filled 2016-08-13: qty 1

## 2016-08-13 MED ORDER — PROCHLORPERAZINE EDISYLATE 5 MG/ML IJ SOLN
10.0000 mg | Freq: Once | INTRAMUSCULAR | Status: AC
  Administered 2016-08-13: 10 mg via INTRAVENOUS
  Filled 2016-08-13: qty 2

## 2016-08-13 MED ORDER — KETOROLAC TROMETHAMINE 30 MG/ML IJ SOLN
30.0000 mg | Freq: Once | INTRAMUSCULAR | Status: AC
  Administered 2016-08-13: 30 mg via INTRAVENOUS
  Filled 2016-08-13: qty 1

## 2016-08-13 MED ORDER — PROMETHAZINE HCL 25 MG PO TABS: 25.0000 mg | ORAL_TABLET | Freq: Four times a day (QID) | ORAL | 0 refills | Status: DC | PRN

## 2016-08-13 NOTE — Discharge Instructions (Signed)
Your vital signs are within normal limits. There no gross neurologic deficits appreciated on your examination at this time. Please continue your current medication. Please add promethazine every 6 hours as needed for nausea/vomiting. Please see your primary physician to discuss any adjustments in your current medications.

## 2016-08-13 NOTE — ED Provider Notes (Signed)
[facility] DEPT Provider Note   CSN: [id number] Arrival date & time: 08/13/16  1550     History   Chief Complaint Chief Complaint  Patient presents with  . Migraine    HPI Dominique Harrington is a 45 y.o. female.  Patient reports the headache starts in the back of the head and neck and moves to the frontal portion of her head. This is similar to previous headaches. These headaches are associated with nausea, vomiting, and sensitivity to light.   The history is provided by the patient.  Migraine  This is a chronic problem. The current episode started 2 days ago. The problem occurs daily. The problem has been gradually worsening. Associated symptoms include headaches. Exacerbated by: lights and loud noises. Nothing relieves the symptoms. She has tried nothing for the symptoms.    Past Medical History:  Diagnosis Date  . Anxiety   . Dyspnea on exertion 2013   Echo, EF =>55%  . Hypertension   . Migraines   . Tachycardia     Patient Active Problem List   Diagnosis Date Noted  . Obesity, unspecified 11/11/2015  . Essential hypertension, benign 08/12/2015  . Hyperlipidemia 08/12/2015  . Cigarette nicotine dependence, uncomplicated 08/12/2015  . Headache, migraine 08/12/2015    Past Surgical History:  Procedure Laterality Date  . BACK SURGERY    . COSMETIC SURGERY    . HERNIA REPAIR    . TUBAL LIGATION  2004    OB History    Gravida Para Term Preterm AB Living   2 2 2     2    SAB TAB Ectopic Multiple Live Births                   Home Medications    Prior to Admission medications   Medication Sig Start Date End Date Taking? Authorizing Provider  butalbital-acetaminophen-caffeine (FIORICET, ESGIC) 50-325-40 MG tablet TAKE ONE TO TWO TABLETS BY MOUTH EVERY 6 HOURS AS NEEDED FOR HEADACHE 06/04/16  Yes Jacquelin HawkingShannon McElroy, PA-C  hydrOXYzine (VISTARIL) 50 MG capsule TAKE ONE CAPSULE BY MOUTH AT BEDTIME AS NEEDED 06/04/16  Yes Jacquelin HawkingShannon McElroy, PA-C    lisinopril-hydrochlorothiazide (ZESTORETIC) 20-12.5 MG tablet Take 1 tablet by mouth daily. 08/12/15  Yes Jacquelin HawkingShannon McElroy, PA-C  lovastatin (MEVACOR) 20 MG tablet Take 1 tablet (20 mg total) by mouth at bedtime. 02/27/16  Yes Jacquelin HawkingShannon McElroy, PA-C  metoprolol (LOPRESSOR) 100 MG tablet TAKE ONE TABLET BY MOUTH TWICE DAILY FOR BLOOD PRESSURE 06/04/16  Yes Jacquelin HawkingShannon McElroy, PA-C  Omega-3 Fatty Acids (FISH OIL PO) Take 2,000 mg by mouth 2 (two) times daily.    Yes Historical Provider, MD    Family History Family History  Problem Relation Age of Onset  . Heart disease Father     Social History Social History  Substance Use Topics  . Smoking status: Current Every Day Smoker    Packs/day: 0.50    Years: 26.00    Types: Cigarettes  . Smokeless tobacco: Never Used  . Alcohol use No     Allergies   Sulfa antibiotics   Review of Systems Review of Systems  Eyes: Positive for photophobia.  Gastrointestinal: Positive for nausea and vomiting.  Neurological: Positive for headaches.  All other systems reviewed and are negative.    Physical Exam Updated Vital Signs BP 120/93 (BP Location: Left Arm)   Pulse 93   Temp 97.8 F (36.6 C) (Oral)   Resp 18   Ht 5\' 7"  (1.702 m)   Wt  104.3 kg   LMP 12/19/2014   SpO2 99%   BMI 36.02 kg/m   Physical Exam  Constitutional: She appears well-developed and well-nourished. No distress.  HENT:  Head: Normocephalic and atraumatic.  Right Ear: External ear normal.  Left Ear: External ear normal.  Eyes: Conjunctivae are normal. Right eye exhibits no discharge. Left eye exhibits no discharge. No scleral icterus.  Neck: Neck supple. No tracheal deviation present.  Cardiovascular: Normal rate, regular rhythm and intact distal pulses.   Pulmonary/Chest: Effort normal and breath sounds normal. No stridor. No respiratory distress. She has no wheezes. She has no rales.  Abdominal: Soft. Bowel sounds are normal. She exhibits no distension. There is no  tenderness. There is no rebound and no guarding.  Musculoskeletal: She exhibits no edema or tenderness.  Neurological: She is alert. She has normal strength. No cranial nerve deficit (no facial droop, extraocular movements intact, no slurred speech) or sensory deficit. She exhibits normal muscle tone. She displays no seizure activity. Coordination normal.  Skin: Skin is warm and dry. No rash noted.  Psychiatric: She has a normal mood and affect.  Nursing note and vitals reviewed.    ED Treatments / Results  Labs (all labs ordered are listed, but only abnormal results are displayed) Labs Reviewed - No data to display  EKG  EKG Interpretation None       Radiology No results found.  Procedures Procedures (including critical care time)  Medications Ordered in ED Medications - No data to display   Initial Impression / Assessment and Plan / ED Course  I have reviewed the triage vital signs and the nursing notes.  Pertinent labs & imaging results that were available during my care of the patient were reviewed by me and considered in my medical decision making (see chart for details).  Clinical Course     **I have reviewed nursing notes, vital signs, and all appropriate lab and imaging results for this patient.*  Final Clinical Impressions(s) / ED Diagnoses  Vital signs nonacute.  There is no history of sudden onset, rapid exacerbation, or stiffness of the neck. Doubt aneurysmal type headache. There is no altered level of consciousness reported. There's been no double vision noted that he. No unusual fever, and no weakness.   Recheck after medication. Patient states she feels some better. His nausea has resolved. Headache is improving. She feels she can manage the discomfort with her current medications at this point. No gross neurologic deficits appreciated. The patient will continue her current medications. Will add promethazine for nausea. Patient will return to the  emergency department if any changes, problems, or concerns.    Final diagnoses:  None    New Prescriptions New Prescriptions   No medications on file     Ivery QualeHobson Mayrene Bastarache, PA-C 08/13/16 1736    Benjiman CoreNathan Pickering, MD 08/13/16 [id number]

## 2016-08-13 NOTE — ED Triage Notes (Signed)
Pt reports migraine x 2 days.  Reports nausea, vomiting, and photophobia.

## 2016-09-30 ENCOUNTER — Ambulatory Visit: Payer: Self-pay | Admitting: Physician Assistant

## 2016-09-30 ENCOUNTER — Encounter: Payer: Self-pay | Admitting: Physician Assistant

## 2016-09-30 VITALS — BP 122/86 | HR 66 | Temp 97.2°F | Ht 66.5 in | Wt 267.8 lb

## 2016-09-30 DIAGNOSIS — I10 Essential (primary) hypertension: Secondary | ICD-10-CM

## 2016-09-30 DIAGNOSIS — E785 Hyperlipidemia, unspecified: Secondary | ICD-10-CM

## 2016-09-30 DIAGNOSIS — F17210 Nicotine dependence, cigarettes, uncomplicated: Secondary | ICD-10-CM

## 2016-09-30 LAB — COMPREHENSIVE METABOLIC PANEL
ALK PHOS: 87 U/L (ref 33–115)
ALT: 17 U/L (ref 6–29)
AST: 17 U/L (ref 10–35)
Albumin: 4.4 g/dL (ref 3.6–5.1)
BUN: 16 mg/dL (ref 7–25)
CHLORIDE: 104 mmol/L (ref 98–110)
CO2: 25 mmol/L (ref 20–31)
CREATININE: 1.05 mg/dL (ref 0.50–1.10)
Calcium: 9.8 mg/dL (ref 8.6–10.2)
GLUCOSE: 115 mg/dL — AB (ref 65–99)
POTASSIUM: 5 mmol/L (ref 3.5–5.3)
SODIUM: 138 mmol/L (ref 135–146)
TOTAL PROTEIN: 6.9 g/dL (ref 6.1–8.1)
Total Bilirubin: 0.4 mg/dL (ref 0.2–1.2)

## 2016-09-30 LAB — LIPID PANEL
Cholesterol: 281 mg/dL — ABNORMAL HIGH (ref <200)
HDL: 41 mg/dL — ABNORMAL LOW (ref >50)
TRIGLYCERIDES: 540 mg/dL — AB (ref <150)
Total CHOL/HDL Ratio: 6.9 Ratio — ABNORMAL HIGH (ref <5.0)

## 2016-09-30 MED ORDER — BUTALBITAL-APAP-CAFFEINE 50-325-40 MG PO TABS: ORAL_TABLET | ORAL | 0 refills | Status: DC

## 2016-09-30 MED ORDER — HYDROXYZINE PAMOATE 50 MG PO CAPS: 50.0000 mg | ORAL_CAPSULE | Freq: Every evening | ORAL | 0 refills | Status: DC | PRN

## 2016-09-30 MED ORDER — LOVASTATIN 20 MG PO TABS: 20.0000 mg | ORAL_TABLET | Freq: Every day | ORAL | 0 refills | Status: DC

## 2016-09-30 MED ORDER — METOPROLOL TARTRATE 100 MG PO TABS: ORAL_TABLET | ORAL | 0 refills | Status: DC

## 2016-09-30 MED ORDER — LISINOPRIL-HYDROCHLOROTHIAZIDE 20-12.5 MG PO TABS: 1.0000 | ORAL_TABLET | Freq: Every day | ORAL | 0 refills | Status: DC

## 2016-09-30 NOTE — Progress Notes (Signed)
ik  BP 122/86 (BP Location: Left Arm, Patient Position: Sitting, Cuff Size: Normal)   Pulse 66   Temp 97.2 F (36.2 C)   Ht 5' 6.5" (1.689 m)   Wt 267 lb 12 oz (121.5 kg)   LMP 12/19/2014   SpO2 99%   BMI 42.57 kg/m    Subjective:    Patient ID: Dominique Harrington, female    DOB: 1971/03/16, 46 y.o.   MRN: [id number]  HPI: Dominique Harrington is a 46 y.o. female presenting on 09/30/2016 for Hypertension and Hyperlipidemia   HPI   Pt doing well.  Last seen here in september.  Pt never got her labs drawn as instructed.   Relevant past medical, surgical, family and social history reviewed and updated as indicated. Interim medical history since our last visit reviewed. Allergies and medications reviewed and updated.   Current Outpatient Prescriptions:  .  butalbital-acetaminophen-caffeine (FIORICET, ESGIC) 50-325-40 MG tablet, TAKE ONE TO TWO TABLETS BY MOUTH EVERY 6 HOURS AS NEEDED FOR HEADACHE, Disp: 20 tablet, Rfl: 0 .  hydrOXYzine (VISTARIL) 50 MG capsule, TAKE ONE CAPSULE BY MOUTH AT BEDTIME AS NEEDED, Disp: 30 capsule, Rfl: 2 .  lisinopril-hydrochlorothiazide (ZESTORETIC) 20-12.5 MG tablet, Take 1 tablet by mouth daily., Disp: 30 tablet, Rfl: 3 .  lovastatin (MEVACOR) 20 MG tablet, Take 1 tablet (20 mg total) by mouth at bedtime., Disp: 30 tablet, Rfl: 0 .  metoprolol (LOPRESSOR) 100 MG tablet, TAKE ONE TABLET BY MOUTH TWICE DAILY FOR BLOOD PRESSURE, Disp: 60 tablet, Rfl: 2 .  Omega-3 Fatty Acids (FISH OIL PO), Take 1,000 mg by mouth 2 (two) times daily. , Disp: , Rfl:    Review of Systems  Constitutional: Positive for fatigue. Negative for appetite change, chills, diaphoresis, fever and unexpected weight change.  HENT: Positive for congestion, sneezing and sore throat. Negative for dental problem, drooling, ear pain, facial swelling, hearing loss, mouth sores, trouble swallowing and voice change.   Eyes: Negative for pain, discharge, redness, itching and visual disturbance.   Respiratory: Negative for cough, choking, shortness of breath and wheezing.   Cardiovascular: Negative for chest pain, palpitations and leg swelling.  Gastrointestinal: Negative for abdominal pain, blood in stool, constipation, diarrhea and vomiting.  Endocrine: Negative for cold intolerance, heat intolerance and polydipsia.  Genitourinary: Negative for decreased urine volume, dysuria and hematuria.  Musculoskeletal: Negative for arthralgias, back pain and gait problem.  Skin: Negative for rash.  Allergic/Immunologic: Negative for environmental allergies.  Neurological: Positive for headaches. Negative for seizures, syncope and light-headedness.  Hematological: Negative for adenopathy.  Psychiatric/Behavioral: Negative for agitation, dysphoric mood and suicidal ideas. The patient is not nervous/anxious.     Per HPI unless specifically indicated above     Objective:    BP 122/86 (BP Location: Left Arm, Patient Position: Sitting, Cuff Size: Normal)   Pulse 66   Temp 97.2 F (36.2 C)   Ht 5' 6.5" (1.689 m)   Wt 267 lb 12 oz (121.5 kg)   LMP 12/19/2014   SpO2 99%   BMI 42.57 kg/m   Wt Readings from Last 3 Encounters:  09/30/16 267 lb 12 oz (121.5 kg)  08/13/16 230 lb (104.3 kg)  06/29/16 257 lb 3.2 oz (116.7 kg)    Physical Exam  Constitutional: She is oriented to person, place, and time. She appears well-developed and well-nourished.  HENT:  Head: Normocephalic and atraumatic.  Neck: Neck supple.  Cardiovascular: Normal rate and regular rhythm.   Pulmonary/Chest: Effort normal and breath sounds normal.  She has no wheezes.  Abdominal: Soft. Bowel sounds are normal. She exhibits no mass. There is no hepatosplenomegaly. There is no tenderness.  Musculoskeletal: She exhibits no edema.  Lymphadenopathy:    She has no cervical adenopathy.  Neurological: She is alert and oriented to person, place, and time.  Skin: Skin is warm and dry.  Psychiatric: She has a normal mood and  affect. Her behavior is normal.  Nursing note and vitals reviewed.       Assessment & Plan:    Encounter Diagnoses  Name Primary?  . Essential hypertension, benign Yes  . Hyperlipidemia, unspecified hyperlipidemia type   . Cigarette nicotine dependence, uncomplicated   . Morbid obesity (HCC)     -pt toGet labs drawn today.  Will call with results -pt to contiinue current rx  -counseled smoking cessation -follow up 3 months.  RTO sooner prn

## 2016-10-01 ENCOUNTER — Other Ambulatory Visit: Payer: Self-pay | Admitting: Physician Assistant

## 2016-10-01 DIAGNOSIS — I10 Essential (primary) hypertension: Secondary | ICD-10-CM

## 2016-10-01 DIAGNOSIS — E785 Hyperlipidemia, unspecified: Secondary | ICD-10-CM

## 2016-10-13 ENCOUNTER — Ambulatory Visit: Payer: Self-pay | Admitting: Physician Assistant

## 2016-10-13 ENCOUNTER — Encounter: Payer: Self-pay | Admitting: Physician Assistant

## 2016-10-13 VITALS — Temp 97.7°F | Ht 66.5 in | Wt 262.0 lb

## 2016-10-13 DIAGNOSIS — E785 Hyperlipidemia, unspecified: Secondary | ICD-10-CM

## 2016-10-13 DIAGNOSIS — R42 Dizziness and giddiness: Secondary | ICD-10-CM

## 2016-10-13 DIAGNOSIS — W19XXXA Unspecified fall, initial encounter: Secondary | ICD-10-CM

## 2016-10-13 DIAGNOSIS — R197 Diarrhea, unspecified: Secondary | ICD-10-CM

## 2016-10-13 NOTE — Progress Notes (Signed)
Temp 97.7 F (36.5 C) (Other (Comment))   Ht 5' 6.5" (1.689 m)   Wt 262 lb (118.8 kg)   LMP 12/19/2014   SpO2 97%   BMI 41.65 kg/m    Subjective:    Patient ID: Dominique Harrington, female    DOB: Jan 03, 1971, 46 y.o.   MRN: [id number]  HPI: Dominique Harrington is a 46 y.o. female presenting on 10/13/2016 for Dizziness; Fall; and Blood Pressure Check (bp at 11:30 today, bp was 101/70)   HPI   Chief Complaint  Patient presents with  . Dizziness  . Fall  . Blood Pressure Check    bp at 11:30 today, bp was 101/70     Pt had diarrhea x 3 days.  Also has a cold for about 4 days.  Today only one episode diarrhea.  Yesterday 2 episodes.  No emesis.  Pt says she is drinking.  States not much appetite  Pt did not get hurt when she fell today. No LOC.   Pt drinking less past several days since being sick, no appetitis.  No pain except slight HA.    No CP or abdominal pain  Pt states had similar thing happen about several year ago and her bp med had to be stopped for about a week or two.  She says no cause was found  Relevant past medical, surgical, family and social history reviewed and updated as indicated. Interim medical history since our last visit reviewed. Allergies and medications reviewed and updated.   Current Outpatient Prescriptions:  .  butalbital-acetaminophen-caffeine (FIORICET, ESGIC) 50-325-40 MG tablet, TAKE ONE TO TWO TABLETS BY MOUTH EVERY 6 HOURS AS NEEDED FOR HEADACHE, Disp: 20 tablet, Rfl: 0 .  hydrOXYzine (VISTARIL) 50 MG capsule, Take 1 capsule (50 mg total) by mouth at bedtime as needed., Disp: 30 capsule, Rfl: 0 .  lisinopril-hydrochlorothiazide (ZESTORETIC) 20-12.5 MG tablet, Take 1 tablet by mouth daily., Disp: 30 tablet, Rfl: 0 .  lovastatin (MEVACOR) 20 MG tablet, Take 1 tablet (20 mg total) by mouth at bedtime., Disp: 30 tablet, Rfl: 0 .  metoprolol (LOPRESSOR) 100 MG tablet, TAKE ONE TABLET BY MOUTH TWICE DAILY FOR BLOOD PRESSURE, Disp: 60 tablet, Rfl:  0 .  Omega-3 Fatty Acids (FISH OIL PO), Take 1,000 mg by mouth 2 (two) times daily. , Disp: , Rfl:    Review of Systems  Constitutional: Positive for fatigue. Negative for appetite change, chills, diaphoresis, fever and unexpected weight change.  HENT: Positive for congestion. Negative for drooling, ear pain, facial swelling, hearing loss, mouth sores, sneezing, sore throat, trouble swallowing and voice change.   Eyes: Negative for pain, discharge, redness, itching and visual disturbance.  Respiratory: Positive for cough. Negative for choking, shortness of breath and wheezing.   Cardiovascular: Negative for chest pain, palpitations and leg swelling.  Gastrointestinal: Positive for diarrhea. Negative for abdominal pain, blood in stool, constipation and vomiting.  Endocrine: Negative for cold intolerance, heat intolerance and polydipsia.  Genitourinary: Negative for decreased urine volume, dysuria and hematuria.  Musculoskeletal: Negative for arthralgias, back pain and gait problem.  Skin: Negative for rash.  Allergic/Immunologic: Negative for environmental allergies.  Neurological: Positive for light-headedness and headaches. Negative for seizures and syncope.  Hematological: Negative for adenopathy.  Psychiatric/Behavioral: Negative for agitation, dysphoric mood and suicidal ideas. The patient is not nervous/anxious.     Per HPI unless specifically indicated above     Objective:    Temp 97.7 F (36.5 C) (Other (Comment))   Ht 5'  6.5" (1.689 m)   Wt 262 lb (118.8 kg)   LMP 12/19/2014   SpO2 97%   BMI 41.65 kg/m   Wt Readings from Last 3 Encounters:  10/13/16 262 lb (118.8 kg)  09/30/16 267 lb 12 oz (121.5 kg)  08/13/16 230 lb (104.3 kg)    Orthostatic VS for the past 24 hrs:  BP- Lying Pulse- Lying BP- Sitting Pulse- Sitting BP- Standing at 0 minutes Pulse- Standing at 0 minutes  10/13/16 1442 (!) 124/94 68 100/82 79 104/86 90       Physical Exam  Constitutional: She is  oriented to person, place, and time. She appears well-developed and well-nourished.  HENT:  Head: Normocephalic and atraumatic.  Right Ear: Hearing, tympanic membrane, external ear and ear canal normal.  Left Ear: Hearing, tympanic membrane, external ear and ear canal normal.  Nose: Rhinorrhea present.  Mouth/Throat: Uvula is midline and oropharynx is clear and moist. No oropharyngeal exudate.  Neck: Neck supple.  Cardiovascular: Normal rate and regular rhythm.   Pulmonary/Chest: Effort normal and breath sounds normal. She has no wheezes.  Abdominal: Soft. Bowel sounds are normal. She exhibits no mass. There is no hepatosplenomegaly. There is no tenderness.  Musculoskeletal: She exhibits no edema.  Lymphadenopathy:    She has no cervical adenopathy.  Neurological: She is alert and oriented to person, place, and time. She has normal strength. She displays no tremor. No cranial nerve deficit or sensory deficit. Coordination and gait normal.  Reflex Scores:      Patellar reflexes are 2+ on the right side and 2+ on the left side. Skin: Skin is warm and dry.  Psychiatric: She has a normal mood and affect. Her behavior is normal.  Vitals reviewed.   Results for orders placed or performed in visit on 09/30/16  Lipid Profile  Result Value Ref Range   Cholesterol 281 (H) <200 mg/dL   Triglycerides 098540 (H) <150 mg/dL   HDL 41 (L) >11>50 mg/dL   Total CHOL/HDL Ratio 6.9 (H) <5.0 Ratio   VLDL NOT CALC <30 mg/dL   LDL Cholesterol NOT CALC <100 mg/dL  Comprehensive Metabolic Panel (CMET)  Result Value Ref Range   Sodium 138 135 - 146 mmol/L   Potassium 5.0 3.5 - 5.3 mmol/L   Chloride 104 98 - 110 mmol/L   CO2 25 20 - 31 mmol/L   Glucose, Bld 115 (H) 65 - 99 mg/dL   BUN 16 7 - 25 mg/dL   Creat 9.141.05 7.820.50 - 9.561.10 mg/dL   Total Bilirubin 0.4 0.2 - 1.2 mg/dL   Alkaline Phosphatase 87 33 - 115 U/L   AST 17 10 - 35 U/L   ALT 17 6 - 29 U/L   Total Protein 6.9 6.1 - 8.1 g/dL   Albumin 4.4 3.6 - 5.1  g/dL   Calcium 9.8 8.6 - 21.310.2 mg/dL      Assessment & Plan:    Encounter Diagnoses  Name Primary?  . Dizzy Yes  . Hyperlipidemia, unspecified hyperlipidemia type   . Diarrhea, unspecified type   . Fall, initial encounter     -reviewed recent labs with pt.  She needs to increase the fish oil to 4 daily.  She is given lowfat diet sheet -pt is to take 1/2 metoprolol bid.  She needs to push fluids -she should continue her other medications -recommended she avoid driving and use caution with walking to prevent injury if she falls -follow up on Monday.  RTO sooner if worsens or new  symtpoms.  Go to ER if needed -pt states understanding and is in agreement with plan

## 2016-10-13 NOTE — Patient Instructions (Addendum)
Push fluids/water  Cut back metoprolol to 1/2 tablet twice daily  Increase fish oil to 4 daily and follow lowfat diet  ___________________________________________________   Fat and Cholesterol Restricted Diet High levels of fat and cholesterol in your blood may lead to various health problems, such as diseases of the heart, blood vessels, gallbladder, liver, and pancreas. Fats are concentrated sources of energy that come in various forms. Certain types of fat, including saturated fat, may be harmful in excess. Cholesterol is a substance needed by your body in small amounts. Your body makes all the cholesterol it needs. Excess cholesterol comes from the food you eat. When you have high levels of cholesterol and saturated fat in your blood, health problems can develop because the excess fat and cholesterol will gather along the walls of your blood vessels, causing them to narrow. Choosing the right foods will help you control your intake of fat and cholesterol. This will help keep the levels of these substances in your blood within normal limits and reduce your risk of disease. What is my plan? Your health care provider recommends that you:  Limit your fat intake to ______% or less of your total calories per day.  Limit the amount of cholesterol in your diet to less than _________mg per day.  Eat 20-30 grams of fiber each day. What types of fat should I choose?  Choose healthy fats more often. Choose monounsaturated and polyunsaturated fats, such as olive and canola oil, flaxseeds, walnuts, almonds, and seeds.  Eat more omega-3 fats. Good choices include salmon, mackerel, sardines, tuna, flaxseed oil, and ground flaxseeds. Aim to eat fish at least two times a week.  Limit saturated fats. Saturated fats are primarily found in animal products, such as meats, butter, and cream. Plant sources of saturated fats include palm oil, palm kernel oil, and coconut oil.  Avoid foods with partially  hydrogenated oils in them. These contain trans fats. Examples of foods that contain trans fats are stick margarine, some tub margarines, cookies, crackers, and other baked goods. What general guidelines do I need to follow? These guidelines for healthy eating will help you control your intake of fat and cholesterol:  Check food labels carefully to identify foods with trans fats or high amounts of saturated fat.  Fill one half of your plate with vegetables and green salads.  Fill one fourth of your plate with whole grains. Look for the word "whole" as the first word in the ingredient list.  Fill one fourth of your plate with lean protein foods.  Limit fruit to two servings a day. Choose fruit instead of juice.  Eat more foods that contain fiber, such as apples, broccoli, carrots, beans, peas, and barley.  Eat more home-cooked food and less restaurant, buffet, and fast food.  Limit or avoid alcohol.  Limit foods high in starch and sugar.  Limit fried foods.  Cook foods using methods other than frying. Baking, boiling, grilling, and broiling are all great options.  Lose weight if you are overweight. Losing just 5-10% of your initial body weight can help your overall health and prevent diseases such as diabetes and heart disease. What foods can I eat? Grains  Whole grains, such as whole wheat or whole grain breads, crackers, cereals, and pasta. Unsweetened oatmeal, bulgur, barley, quinoa, or brown rice. Corn or whole wheat flour tortillas. Vegetables  Fresh or frozen vegetables (raw, steamed, roasted, or grilled). Green salads. Fruits  All fresh, canned (in natural juice), or frozen fruits. Meats and  other protein foods  Ground beef (85% or leaner), grass-fed beef, or beef trimmed of fat. Skinless chicken or [place]. Ground chicken or [place]. Pork trimmed of fat. All fish and seafood. Eggs. Dried beans, peas, or lentils. Unsalted nuts or seeds. Unsalted canned or dry  beans. Dairy  Low-fat dairy products, such as skim or 1% milk, 2% or reduced-fat cheeses, low-fat ricotta or cottage cheese, or plain low-fat yo Fats and oils  Tub margarines without trans fats. Light or reduced-fat mayonnaise and salad dressings. Avocado. Olive, canola, sesame, or safflower oils. Natural peanut or almond butter (choose ones without added sugar and oil). The items listed above may not be a complete list of recommended foods or beverages. Contact your dietitian for more options.  Foods to avoid Grains  White bread. White pasta. White rice. Cornbread. Bagels, pastries, and croissants. Crackers that contain trans fat. Vegetables  White potatoes. Corn. Creamed or fried vegetables. Vegetables in a cheese sauce. Fruits  Dried fruits. Canned fruit in light or heavy syrup. Fruit juice. Meats and other protein foods  Fatty cuts of meat. Ribs, chicken wings, bacon, sausage, bologna, salami, chitterlings, fatback, hot dogs, bratwurst, and packaged luncheon meats. Liver and organ meats. Dairy  Whole or 2% milk, cream, half-and-half, and cream cheese. Whole milk cheeses. Whole-fat or sweetened yogurt. Full-fat cheeses. Nondairy creamers and whipped toppings. Processed cheese, cheese spreads, or cheese curds. Beverages  Alcohol. Sweetened drinks (such as sodas, lemonade, and fruit drinks or punches). Fats and oils  Butter, stick margarine, lard, shortening, ghee, or bacon fat. Coconut, palm kernel, or palm oils. Sweets and desserts  Corn syrup, sugars, honey, and molasses. Candy. Jam and jelly. Syrup. Sweetened cereals. Cookies, pies, cakes, donuts, muffins, and ice cream. The items listed above may not be a complete list of foods and beverages to avoid. Contact your dietitian for more information.  This information is not intended to replace advice given to you by your health care provider. Make sure you discuss any questions you have with your health care provider. Document  Released: 09/14/2005 Document Revised: 10/05/2014 Document Reviewed: 12/13/2013 [employer] Interactive Patient Education  2017 [employer]

## 2016-10-19 ENCOUNTER — Ambulatory Visit (HOSPITAL_COMMUNITY)
Admission: RE | Admit: 2016-10-19 | Discharge: 2016-10-19 | Disposition: A | Discharge status: Home / Self Care | Payer: Self-pay | Source: Ambulatory Visit | Attending: Physician Assistant | Admitting: Physician Assistant

## 2016-10-19 ENCOUNTER — Ambulatory Visit: Payer: Self-pay | Admitting: Physician Assistant

## 2016-10-19 ENCOUNTER — Encounter: Payer: Self-pay | Admitting: Physician Assistant

## 2016-10-19 VITALS — BP 128/88 | HR 69 | Temp 97.3°F | Ht 66.5 in | Wt 261.5 lb

## 2016-10-19 DIAGNOSIS — R42 Dizziness and giddiness: Secondary | ICD-10-CM | POA: Insufficient documentation

## 2016-10-19 DIAGNOSIS — R0989 Other specified symptoms and signs involving the circulatory and respiratory systems: Secondary | ICD-10-CM

## 2016-10-19 DIAGNOSIS — I10 Essential (primary) hypertension: Secondary | ICD-10-CM | POA: Insufficient documentation

## 2016-10-19 DIAGNOSIS — R197 Diarrhea, unspecified: Secondary | ICD-10-CM

## 2016-10-19 DIAGNOSIS — J069 Acute upper respiratory infection, unspecified: Secondary | ICD-10-CM

## 2016-10-19 DIAGNOSIS — F17210 Nicotine dependence, cigarettes, uncomplicated: Secondary | ICD-10-CM

## 2016-10-19 LAB — CBC WITH DIFFERENTIAL/PLATELET
BASOS ABS: 73 {cells}/uL (ref 0–200)
Basophils Relative: 1 %
EOS ABS: 219 {cells}/uL (ref 15–500)
Eosinophils Relative: 3 %
HCT: 44.3 % (ref 35.0–45.0)
HEMOGLOBIN: 15 g/dL (ref 11.7–15.5)
Lymphocytes Relative: 34 %
Lymphs Abs: 2482 cells/uL (ref 850–3900)
MCH: 30.9 pg (ref 27.0–33.0)
MCHC: 33.9 g/dL (ref 32.0–36.0)
MCV: 91.3 fL (ref 80.0–100.0)
MONOS PCT: 6 %
MPV: 8.8 fL (ref 7.5–12.5)
Monocytes Absolute: 438 cells/uL (ref 200–950)
NEUTROS ABS: 4088 {cells}/uL (ref 1500–7800)
NEUTROS PCT: 56 %
PLATELETS: 227 10*3/uL (ref 140–400)
RBC: 4.85 MIL/uL (ref 3.80–5.10)
RDW: 14.5 % (ref 11.0–15.0)
WBC: 7.3 10*3/uL (ref 3.8–10.8)

## 2016-10-19 LAB — BASIC METABOLIC PANEL
BUN: 14 mg/dL (ref 7–25)
CALCIUM: 10.2 mg/dL (ref 8.6–10.2)
CO2: 26 mmol/L (ref 20–31)
CREATININE: 0.88 mg/dL (ref 0.50–1.10)
Chloride: 104 mmol/L (ref 98–110)
GLUCOSE: 108 mg/dL — AB (ref 65–99)
Potassium: 4.3 mmol/L (ref 3.5–5.3)
Sodium: 139 mmol/L (ref 135–146)

## 2016-10-19 MED ORDER — AZITHROMYCIN 250 MG PO TABS: ORAL_TABLET | ORAL | 0 refills | Status: AC

## 2016-10-19 NOTE — Progress Notes (Signed)
BP 128/88 (BP Location: Left Arm, Patient Position: Sitting, Cuff Size: Normal)   Pulse 69   Temp 97.3 F (36.3 C) (Other (Comment))   Ht 5' 6.5" (1.689 m)   Wt 261 lb 8 oz (118.6 kg)   LMP 12/19/2014   SpO2 99%   BMI 41.57 kg/m    Subjective:    Patient ID: Dominique Harrington, female    DOB: Oct 22, 1970, 46 y.o.   MRN: [id number]  HPI: Dominique Dominique Harrington is a 46 y.o. female presenting on 10/19/2016 for Hypertension (states dizziness has decreased in intensity, but still occurs, fell at home Saturday, skinned knee)   HPI   Pt is feeling improved today.  She is not as dizzy as she was last week but she is still dizzy.   She says she is not spinning.  She has had vertigo in the past and says this is different.   She is still having diarrhea and is still congested.  She is still drinking pleny water.  No one else at home sick.   Diarrhea twice yesterday.  Had it one time this morning.  No blood.  No emesis.  A little bit of nausea.  Still reduced appetite.  Diarrhea is just a little bit, not copious.  Says it is mucus-like  Relevant past medical, surgical, family and social history reviewed and updated as indicated. Interim medical history since our last visit reviewed. Allergies and medications reviewed and updated.   Current Outpatient Prescriptions:  .  butalbital-acetaminophen-caffeine (FIORICET, ESGIC) 50-325-40 MG tablet, TAKE ONE TO TWO TABLETS BY MOUTH EVERY 6 HOURS AS NEEDED FOR HEADACHE, Disp: 20 tablet, Rfl: 0 .  hydrOXYzine (VISTARIL) 50 MG capsule, Take 1 capsule (50 mg total) by mouth at bedtime as needed., Disp: 30 capsule, Rfl: 0 .  lisinopril-hydrochlorothiazide (ZESTORETIC) 20-12.5 MG tablet, Take 1 tablet by mouth daily., Disp: 30 tablet, Rfl: 0 .  lovastatin (MEVACOR) 20 MG tablet, Take 1 tablet (20 mg total) by mouth at bedtime., Disp: 30 tablet, Rfl: 0 .  metoprolol (LOPRESSOR) 100 MG tablet, TAKE ONE TABLET BY MOUTH TWICE DAILY FOR BLOOD PRESSURE (Patient taking  differently: 50 mg. TAKE ONE TABLET BY MOUTH TWICE DAILY FOR BLOOD PRESSURE), Disp: 60 tablet, Rfl: 0 .  Omega-3 Fatty Acids (FISH OIL PO), Take 1,000 mg by mouth 2 (two) times daily. , Disp: , Rfl:    Review of Systems  Constitutional: Positive for fatigue. Negative for appetite change, chills, diaphoresis, fever and unexpected weight change.  HENT: Positive for congestion and sneezing. Negative for dental problem, drooling, ear pain, facial swelling, hearing loss, mouth sores, sore throat, trouble swallowing and voice change.   Eyes: Negative for pain, discharge, redness, itching and visual disturbance.  Respiratory: Positive for cough. Negative for choking, shortness of breath and wheezing.   Cardiovascular: Negative for chest pain, palpitations and leg swelling.  Gastrointestinal: Positive for diarrhea. Negative for abdominal pain, blood in stool, constipation and vomiting.  Endocrine: Negative for cold intolerance, heat intolerance and polydipsia.  Genitourinary: Negative for decreased urine volume, dysuria and hematuria.  Musculoskeletal: Negative for arthralgias, back pain and gait problem.  Skin: Negative for rash.  Allergic/Immunologic: Negative for environmental allergies.  Neurological: Positive for light-headedness and headaches. Negative for seizures and syncope.  Hematological: Negative for adenopathy.  Psychiatric/Behavioral: Positive for dysphoric mood. Negative for agitation and suicidal ideas. The patient is nervous/anxious.     Per HPI unless specifically indicated above     Objective:    BP  128/88 (BP Location: Left Arm, Patient Position: Sitting, Cuff Size: Normal)   Pulse 69   Temp 97.3 F (36.3 C) (Other (Comment))   Ht 5' 6.5" (1.689 m)   Wt 261 lb 8 oz (118.6 kg)   LMP 12/19/2014   SpO2 99%   BMI 41.57 kg/m   Wt Readings from Last 3 Encounters:  10/19/16 261 lb 8 oz (118.6 kg)  10/13/16 262 lb (118.8 kg)  09/30/16 267 lb 12 oz (121.5 kg)     Orthostatic VS for the past 24 hrs:  BP- Lying Pulse- Lying BP- Sitting Pulse- Sitting BP- Standing at 0 minutes Pulse- Standing at 0 minutes  10/19/16 0945 112/82 94 100/78 96 94/70 98      Physical Exam  Constitutional: She is oriented to person, place, and time. She appears well-developed and well-nourished.  HENT:  Head: Normocephalic and atraumatic.  Right Ear: Hearing, tympanic membrane, external ear and ear canal normal.  Left Ear: Hearing, tympanic membrane, external ear and ear canal normal.  Nose: Rhinorrhea present.  Mouth/Throat: Uvula is midline and oropharynx is clear and moist. No oropharyngeal exudate.  Neck: Neck supple.  Cardiovascular: Normal rate and regular rhythm.   Pulmonary/Chest: Effort normal. No accessory muscle usage. No tachypnea and no bradypnea. No respiratory distress. She has wheezes (soft scattered expiratory). She has no rhonchi. She has no rales.  Abdominal: Soft. Bowel sounds are normal. She exhibits no mass. There is no hepatosplenomegaly. There is no tenderness.  Musculoskeletal: She exhibits no edema.  Lymphadenopathy:    She has no cervical adenopathy.  Neurological: She is alert and oriented to person, place, and time. She has normal strength. She displays no tremor. No cranial nerve deficit or sensory deficit. Coordination and gait normal.  Skin: Skin is warm and dry.  Psychiatric: She has a normal mood and affect. Her behavior is normal.  Vitals reviewed.       Assessment & Plan:   Encounter Diagnoses  Name Primary?  . Dizzy Yes  . Diarrhea, unspecified type   . Essential hypertension, benign   . Cigarette nicotine dependence, uncomplicated   . Abnormal lung sounds   . Acute upper respiratory infection     -check Labs, and cxr today.  -rx zpack -pt to Push fluids -Pt given [employer] discount application -counseled to avoid smoking -F/u 1 wk.  RTO sooner prn worsening or new symptoms

## 2016-10-26 ENCOUNTER — Encounter: Payer: Self-pay | Admitting: Physician Assistant

## 2016-10-26 ENCOUNTER — Ambulatory Visit: Payer: Self-pay | Admitting: Physician Assistant

## 2016-10-26 VITALS — BP 114/80 | HR 69 | Temp 97.7°F | Ht 66.5 in | Wt 262.0 lb

## 2016-10-26 DIAGNOSIS — R42 Dizziness and giddiness: Secondary | ICD-10-CM

## 2016-10-26 NOTE — Progress Notes (Signed)
BP 114/80 (BP Location: Left Arm, Patient Position: Sitting, Cuff Size: Normal)   Pulse 69   Temp 97.7 F (36.5 C)   Ht 5' 6.5" (1.689 m)   Wt 262 lb (118.8 kg)   LMP 12/19/2014   SpO2 95%   BMI 41.65 kg/m    Subjective:    Patient ID: Dominique Harrington, female    DOB: 11-Apr-1971, 46 y.o.   MRN: [id number]  HPI: Dominique Harrington is a 46 y.o. female presenting on 10/26/2016 for Follow-up   HPI   Pt is feeling better.  Dizziness resolved.   Pt is feeling good. She has also quit smoking.   Relevant past medical, surgical, family and social history reviewed and updated as indicated. Interim medical history since our last visit reviewed. Allergies and medications reviewed and updated.   Current Outpatient Prescriptions:  .  butalbital-acetaminophen-caffeine (FIORICET, ESGIC) 50-325-40 MG tablet, TAKE ONE TO TWO TABLETS BY MOUTH EVERY 6 HOURS AS NEEDED FOR HEADACHE, Disp: 20 tablet, Rfl: 0 .  hydrOXYzine (VISTARIL) 50 MG capsule, Take 1 capsule (50 mg total) by mouth at bedtime as needed., Disp: 30 capsule, Rfl: 0 .  lisinopril-hydrochlorothiazide (ZESTORETIC) 20-12.5 MG tablet, Take 1 tablet by mouth daily., Disp: 30 tablet, Rfl: 0 .  lovastatin (MEVACOR) 20 MG tablet, Take 1 tablet (20 mg total) by mouth at bedtime., Disp: 30 tablet, Rfl: 0 .  metoprolol (LOPRESSOR) 100 MG tablet, TAKE ONE TABLET BY MOUTH TWICE DAILY FOR BLOOD PRESSURE (Patient taking differently: Take 50 mg by mouth 2 (two) times daily. TAKE ONE TABLET BY MOUTH TWICE DAILY FOR BLOOD PRESSURE), Disp: 60 tablet, Rfl: 0 .  Omega-3 Fatty Acids (FISH OIL PO), Take 1,000 mg by mouth 2 (two) times daily. , Disp: , Rfl:   Review of Systems  Constitutional: Positive for fatigue. Negative for appetite change, chills, diaphoresis, fever and unexpected weight change.  HENT: Negative for congestion, drooling, ear pain, facial swelling, hearing loss, mouth sores, sneezing, sore throat, trouble swallowing and voice change.    Eyes: Negative for pain, discharge, redness, itching and visual disturbance.  Respiratory: Negative for cough, choking, shortness of breath and wheezing.   Cardiovascular: Negative for chest pain, palpitations and leg swelling.  Gastrointestinal: Negative for abdominal pain, blood in stool, constipation, diarrhea and vomiting.  Endocrine: Negative for cold intolerance, heat intolerance and polydipsia.  Genitourinary: Negative for decreased urine volume, dysuria and hematuria.  Musculoskeletal: Negative for arthralgias, back pain and gait problem.  Skin: Negative for rash.  Allergic/Immunologic: Negative for environmental allergies.  Neurological: Positive for headaches. Negative for seizures, syncope and light-headedness.  Hematological: Negative for adenopathy.  Psychiatric/Behavioral: Positive for dysphoric mood. Negative for agitation and suicidal ideas. The patient is nervous/anxious.     Per HPI unless specifically indicated above     Objective:    BP 114/80 (BP Location: Left Arm, Patient Position: Sitting, Cuff Size: Normal)   Pulse 69   Temp 97.7 F (36.5 C)   Ht 5' 6.5" (1.689 m)   Wt 262 lb (118.8 kg)   LMP 12/19/2014   SpO2 95%   BMI 41.65 kg/m   Wt Readings from Last 3 Encounters:  10/26/16 262 lb (118.8 kg)  10/19/16 261 lb 8 oz (118.6 kg)  10/13/16 262 lb (118.8 kg)    Physical Exam  Constitutional: She is oriented to person, place, and time. She appears well-developed and well-nourished.  HENT:  Head: Normocephalic and atraumatic.  Right Ear: Hearing, tympanic membrane, external ear and  ear canal normal.  Left Ear: Hearing, tympanic membrane, external ear and ear canal normal.  Nose: Nose normal.  Mouth/Throat: Uvula is midline and oropharynx is clear and moist. No oropharyngeal exudate.  Neck: Neck supple.  Cardiovascular: Normal rate and regular rhythm.   Pulmonary/Chest: Effort normal and breath sounds normal. She has no wheezes.  Abdominal: Soft.  Bowel sounds are normal. She exhibits no mass. There is no hepatosplenomegaly. There is no tenderness.  Musculoskeletal: She exhibits no edema.  Lymphadenopathy:    She has no cervical adenopathy.  Neurological: She is alert and oriented to person, place, and time.  Skin: Skin is warm and dry.  Psychiatric: She has a normal mood and affect. Her behavior is normal.  Vitals reviewed.   Results for orders placed or performed in visit on 10/19/16  CBC w/Diff/Platelet  Result Value Ref Range   WBC 7.3 3.8 - 10.8 K/uL   RBC 4.85 3.80 - 5.10 MIL/uL   Hemoglobin 15.0 11.7 - 15.5 g/dL   HCT 44.3 35.0 - 45.0 %   MCV 91.3 80.0 - 100.0 fL   MCH 30.9 27.0 - 33.0 pg   MCHC 33.9 32.0 - 36.0 g/dL   RDW 14.5 11.0 - 15.0 %   Platelets 227 140 - 400 K/uL   MPV 8.8 7.5 - 12.5 fL   Neutro Abs 4,088 1,500 - 7,800 cells/uL   Lymphs Abs 2,482 850 - 3,900 cells/uL   Monocytes Absolute 438 200 - 950 cells/uL   Eosinophils Absolute 219 15 - 500 cells/uL   Basophils Absolute 73 0 - 200 cells/uL   Neutrophils Relative % 56 %   Lymphocytes Relative 34 %   Monocytes Relative 6 %   Eosinophils Relative 3 %   Basophils Relative 1 %   Smear Review Criteria for review not met   Basic Metabolic Panel (BMET)  Result Value Ref Range   Sodium 139 135 - 146 mmol/L   Potassium 4.3 3.5 - 5.3 mmol/L   Chloride 104 98 - 110 mmol/L   CO2 26 20 - 31 mmol/L   Glucose, Bld 108 (H) 65 - 99 mg/dL   BUN 14 7 - 25 mg/dL   Creat 0.88 0.50 - 1.10 mg/dL   Calcium 10.2 8.6 - 10.2 mg/dL      Assessment & Plan:   Encounter Diagnosis  Name Primary?  . Dizzy Yes    -Reviewed labs as scheduled. -congratulations for smoking cessation -F/u 2 months as scheduled.  RTO sooner prn

## 2016-11-19 ENCOUNTER — Other Ambulatory Visit: Payer: Self-pay | Admitting: Physician Assistant

## 2016-12-08 ENCOUNTER — Encounter (HOSPITAL_COMMUNITY): Payer: Self-pay | Admitting: Emergency Medicine

## 2016-12-08 ENCOUNTER — Emergency Department (HOSPITAL_COMMUNITY)
Admission: EM | Admit: 2016-12-08 | Discharge: 2016-12-08 | Disposition: A | Discharge status: Home / Self Care | Payer: Self-pay | Attending: Dermatology | Admitting: Dermatology

## 2016-12-08 DIAGNOSIS — I10 Essential (primary) hypertension: Secondary | ICD-10-CM | POA: Insufficient documentation

## 2016-12-08 DIAGNOSIS — Z5321 Procedure and treatment not carried out due to patient leaving prior to being seen by health care provider: Secondary | ICD-10-CM | POA: Insufficient documentation

## 2016-12-08 DIAGNOSIS — Z87891 Personal history of nicotine dependence: Secondary | ICD-10-CM | POA: Insufficient documentation

## 2016-12-08 DIAGNOSIS — G43909 Migraine, unspecified, not intractable, without status migrainosus: Secondary | ICD-10-CM | POA: Insufficient documentation

## 2016-12-08 NOTE — ED Notes (Signed)
Pt called no answer 

## 2016-12-08 NOTE — ED Triage Notes (Signed)
Patient c/o migraine headache x2 days. Per patient hx of migraines. Patient reports nausea, vomiting, sensitivity to light and sound. Denies any neurological deficits. Patient reports taking Fioricet and ibuprofen with no relief.

## 2016-12-08 NOTE — ED Notes (Signed)
Pt did not answer when trying to put her in room

## 2016-12-09 ENCOUNTER — Emergency Department (HOSPITAL_COMMUNITY)
Admission: EM | Admit: 2016-12-09 | Discharge: 2016-12-09 | Disposition: A | Discharge status: Home / Self Care | Payer: Self-pay | Attending: Emergency Medicine | Admitting: Emergency Medicine

## 2016-12-09 ENCOUNTER — Encounter (HOSPITAL_COMMUNITY): Payer: Self-pay | Admitting: *Deleted

## 2016-12-09 DIAGNOSIS — Z79899 Other long term (current) drug therapy: Secondary | ICD-10-CM | POA: Insufficient documentation

## 2016-12-09 DIAGNOSIS — G43009 Migraine without aura, not intractable, without status migrainosus: Secondary | ICD-10-CM | POA: Insufficient documentation

## 2016-12-09 DIAGNOSIS — Z87891 Personal history of nicotine dependence: Secondary | ICD-10-CM | POA: Insufficient documentation

## 2016-12-09 DIAGNOSIS — I10 Essential (primary) hypertension: Secondary | ICD-10-CM | POA: Insufficient documentation

## 2016-12-09 MED ORDER — DEXAMETHASONE SODIUM PHOSPHATE 10 MG/ML IJ SOLN
10.0000 mg | Freq: Once | INTRAMUSCULAR | Status: AC
Start: 2016-12-09 — End: 2016-12-09
  Administered 2016-12-09: 10 mg via INTRAVENOUS
  Filled 2016-12-09: qty 1

## 2016-12-09 MED ORDER — MAGNESIUM SULFATE 2 GM/50ML IV SOLN
2.0000 g | Freq: Once | INTRAVENOUS | Status: AC
  Administered 2016-12-09: 2 g via INTRAVENOUS
  Filled 2016-12-09: qty 50

## 2016-12-09 MED ORDER — METOCLOPRAMIDE HCL 5 MG/ML IJ SOLN
10.0000 mg | Freq: Once | INTRAMUSCULAR | Status: AC
  Administered 2016-12-09: 10 mg via INTRAVENOUS
  Filled 2016-12-09: qty 2

## 2016-12-09 MED ORDER — DIPHENHYDRAMINE HCL 50 MG/ML IJ SOLN
25.0000 mg | Freq: Once | INTRAMUSCULAR | Status: AC
  Administered 2016-12-09: 25 mg via INTRAVENOUS
  Filled 2016-12-09: qty 1

## 2016-12-09 MED ORDER — SODIUM CHLORIDE 0.9 % IV BOLUS (SEPSIS)
500.0000 mL | Freq: Once | INTRAVENOUS | Status: AC
  Administered 2016-12-09: 500 mL via INTRAVENOUS

## 2016-12-09 MED ORDER — SODIUM CHLORIDE 0.9 % IV BOLUS (SEPSIS)
1000.0000 mL | Freq: Once | INTRAVENOUS | Status: AC
  Administered 2016-12-09: 1000 mL via INTRAVENOUS

## 2016-12-09 NOTE — Discharge Instructions (Signed)
Go home and rest. Look at the Recurrent Migraine headache information to see if there is something you can do to help less the number of headaches you are having. Recheck as needed.

## 2016-12-09 NOTE — ED Provider Notes (Signed)
[facility]EM[facility]ENCY DEPT Provider Note   CSN: [id number] Arrival date & time: 12/09/16  0526  Time seen 06:05 AM   History   Chief Complaint Chief Complaint  Patient presents with  . Migraine    HPI Dominique Harrington is a 46 y.o. female.  HPI  patient states she has had a history of migraine headaches for the past 15 years. They used to be related to her menstrual periods and stress. However she has not had a period in 2 years. She reports this headache started on March 11. It's the whole top of her head and she describes it as pounding. She has had nausea with vomiting 3 tonight. She took her usual Fioricet and ibuprofen without relief. She describes photophobia and phonophobia. She denies blurred vision, numbness or tingling of her extremities. She does report being under some increased stress recently. She states she used to get the headaches every few months but lately she's been getting them monthly although she normally can control them at home.  PCP Jacquelin HawkingShannon McElroy, PA-C   Past Medical History:  Diagnosis Date  . Anxiety   . Dyspnea on exertion 2013   Echo, EF =>55%  . Hypertension   . Migraines   . Tachycardia     Patient Active Problem List   Diagnosis Date Noted  . Obesity, unspecified 11/11/2015  . Essential hypertension, benign 08/12/2015  . Hyperlipidemia 08/12/2015  . Cigarette nicotine dependence, uncomplicated 08/12/2015  . Headache, migraine 08/12/2015    Past Surgical History:  Procedure Laterality Date  . BACK SURGERY    . COSMETIC SURGERY    . HERNIA REPAIR    . TUBAL LIGATION  2004    OB History    Gravida Para Term Preterm AB Living   2 2 2     2    SAB TAB Ectopic Multiple Live Births                   Home Medications    Prior to Admission medications   Medication Sig Start Date End Date Taking? Authorizing Provider  butalbital-acetaminophen-caffeine (FIORICET, ESGIC) 50-325-40 MG tablet TAKE ONE TO TWO TABLETS BY MOUTH EVERY 6 HOURS AS  NEEDED FOR HEADACHE 11/19/16   Jacquelin HawkingShannon McElroy, PA-C  hydrOXYzine (VISTARIL) 50 MG capsule TAKE ONE CAPSULE BY MOUTH AT BEDTIME AS NEEDED 11/19/16   Jacquelin HawkingShannon McElroy, PA-C  lisinopril-hydrochlorothiazide (ZESTORETIC) 20-12.5 MG tablet Take 1 tablet by mouth daily. 09/30/16   Jacquelin HawkingShannon McElroy, PA-C  lovastatin (MEVACOR) 20 MG tablet Take 1 tablet (20 mg total) by mouth at bedtime. 09/30/16   Jacquelin HawkingShannon McElroy, PA-C  metoprolol (LOPRESSOR) 100 MG tablet TAKE ONE TABLET BY MOUTH TWICE DAILY FOR BLOOD PRESSURE 11/19/16   Jacquelin HawkingShannon McElroy, PA-C  Omega-3 Fatty Acids (FISH OIL PO) Take 1,000 mg by mouth 2 (two) times daily.     Historical Provider, MD    Family History Family History  Problem Relation Age of Onset  . Heart disease Father     Social History Social History  Substance Use Topics  . Smoking status: Former Smoker    Packs/day: 0.50    Years: 26.00    Types: Cigarettes    Quit date: 10/22/2016  . Smokeless tobacco: Never Used     Comment: using Nicorette, has been cutting down  . Alcohol use No  applying for disability   Allergies   Sulfa antibiotics   Review of Systems Review of Systems  All other systems reviewed and are negative.  Physical Exam Updated Vital Signs BP (!) 129/104 (BP Location: Right Arm)   Pulse 76   Temp 97.6 F (36.4 C) (Oral)   Resp 16   Ht 5\' 7"  (1.702 m)   Wt 250 lb (113.4 kg)   LMP 12/19/2014   SpO2 100%   BMI 39.16 kg/m   Vital signs normal except diastolic hypertension   Physical Exam  Constitutional: She is oriented to person, place, and time. She appears well-developed and well-nourished.  Non-toxic appearance. She does not appear ill. She appears distressed.  Wearing sunglasses in a dark room  HENT:  Head: Normocephalic and atraumatic.  Right Ear: External ear normal.  Left Ear: External ear normal.  Nose: Nose normal. No mucosal edema or rhinorrhea.  Mouth/Throat: Mucous membranes are dry. No dental abscesses or uvula swelling.    Eyes: Conjunctivae and EOM are normal. Pupils are equal, round, and reactive to light.  Neck: Normal range of motion and full passive range of motion without pain. Neck supple.  Cardiovascular: Normal rate, regular rhythm and normal heart sounds.  Exam reveals no gallop and no friction rub.   No murmur heard. Pulmonary/Chest: Effort normal and breath sounds normal. No respiratory distress. She has no wheezes. She has no rhonchi. She has no rales. She exhibits no tenderness and no crepitus.  Abdominal: Soft. Normal appearance and bowel sounds are normal. She exhibits no distension. There is no tenderness. There is no rebound and no guarding.  Musculoskeletal: Normal range of motion. She exhibits no edema or tenderness.  Moves all extremities well.   Neurological: She is alert and oriented to person, place, and time. She has normal strength. No cranial nerve deficit.  Skin: Skin is warm, dry and intact. No rash noted. No erythema. No pallor.  Psychiatric: She has a normal mood and affect. Her speech is normal and behavior is normal. Her mood appears not anxious.  Nursing note and vitals reviewed.    ED Treatments / Results  Labs (all labs ordered are listed, but only abnormal results are displayed) Labs Reviewed - No data to display  EKG  EKG Interpretation None       Radiology No results found.  Procedures Procedures (including critical care time)  Medications Ordered in ED Medications  sodium chloride 0.9 % bolus 1,000 mL (0 mLs Intravenous Stopped 12/09/16 0750)  sodium chloride 0.9 % bolus 500 mL (0 mLs Intravenous Stopped 12/09/16 0750)  metoCLOPramide (REGLAN) injection 10 mg (10 mg Intravenous Given 12/09/16 0634)  diphenhydrAMINE (BENADRYL) injection 25 mg (25 mg Intravenous Given 12/09/16 0634)  dexamethasone (DECADRON) injection 10 mg (10 mg Intravenous Given 12/09/16 0634)  magnesium sulfate IVPB 2 g 50 mL (2 g Intravenous New Bag/Given 12/09/16 0752)     Initial  Impression / Assessment and Plan / ED Course  I have reviewed the triage vital signs and the nursing notes.  Pertinent labs & imaging results that were available during my care of the patient were reviewed by me and considered in my medical decision making (see chart for details).  Patient was started on migraine cocktail with IV Reglan, Benadryl, and Toradol. She was given IV fluids.  Recheck 07:15 AM states getting better but still has significant HA. Given IV magnesium.   Recheck it 8:10 AM patient states her headache is much better. She feels ready to go home and get to bed. She was given recurrent migraine headache information to see if there is something in her environment that she can change to  lessen her number of headaches.  Final Clinical Impressions(s) / ED Diagnoses   Final diagnoses:  Migraine without aura and without status migrainosus, not intractable   Plan discharge  Devoria Albe, MD, Concha Pyo, MD 12/09/16 [phone]

## 2016-12-09 NOTE — ED Triage Notes (Signed)
Pt c/o migraine x 3 days; pt states she has some n/v associated with the migraine

## 2016-12-28 ENCOUNTER — Other Ambulatory Visit: Payer: Self-pay | Admitting: Student

## 2016-12-28 DIAGNOSIS — E785 Hyperlipidemia, unspecified: Secondary | ICD-10-CM

## 2016-12-28 DIAGNOSIS — I10 Essential (primary) hypertension: Secondary | ICD-10-CM

## 2016-12-29 ENCOUNTER — Ambulatory Visit: Payer: Self-pay | Admitting: Physician Assistant

## 2017-01-02 LAB — COMPREHENSIVE METABOLIC PANEL
ALT: 15 U/L (ref 6–29)
AST: 15 U/L (ref 10–35)
Albumin: 4.3 g/dL (ref 3.6–5.1)
Alkaline Phosphatase: 92 U/L (ref 33–115)
BUN: 13 mg/dL (ref 7–25)
CO2: 23 mmol/L (ref 20–31)
Calcium: 9.7 mg/dL (ref 8.6–10.2)
Chloride: 107 mmol/L (ref 98–110)
Creat: 0.78 mg/dL (ref 0.50–1.10)
GLUCOSE: 92 mg/dL (ref 65–99)
Potassium: 4.3 mmol/L (ref 3.5–5.3)
SODIUM: 138 mmol/L (ref 135–146)
Total Bilirubin: 0.3 mg/dL (ref 0.2–1.2)
Total Protein: 6.7 g/dL (ref 6.1–8.1)

## 2017-01-02 LAB — LIPID PANEL
Cholesterol: 271 mg/dL — ABNORMAL HIGH (ref <200)
HDL: 42 mg/dL — AB (ref >50)
LDL CALC: 157 mg/dL — AB (ref <100)
Total CHOL/HDL Ratio: 6.5 Ratio — ABNORMAL HIGH (ref <5.0)
Triglycerides: 359 mg/dL — ABNORMAL HIGH (ref <150)
VLDL: 72 mg/dL — ABNORMAL HIGH (ref <30)

## 2017-01-05 ENCOUNTER — Encounter: Payer: Self-pay | Admitting: Physician Assistant

## 2017-01-05 ENCOUNTER — Ambulatory Visit: Payer: Self-pay | Admitting: Physician Assistant

## 2017-01-05 ENCOUNTER — Other Ambulatory Visit: Payer: Self-pay | Admitting: Physician Assistant

## 2017-01-05 VITALS — BP 118/88 | HR 110 | Temp 97.9°F | Ht 66.5 in | Wt 270.0 lb

## 2017-01-05 DIAGNOSIS — E785 Hyperlipidemia, unspecified: Secondary | ICD-10-CM

## 2017-01-05 DIAGNOSIS — G43009 Migraine without aura, not intractable, without status migrainosus: Secondary | ICD-10-CM

## 2017-01-05 DIAGNOSIS — I10 Essential (primary) hypertension: Secondary | ICD-10-CM

## 2017-01-05 MED ORDER — RIZATRIPTAN BENZOATE 5 MG PO TBDP: 5.0000 mg | ORAL_TABLET | ORAL | 0 refills | Status: DC | PRN

## 2017-01-05 MED ORDER — PROMETHAZINE HCL 25 MG PO TABS: ORAL_TABLET | ORAL | 0 refills | Status: DC

## 2017-01-05 MED ORDER — ATORVASTATIN CALCIUM 20 MG PO TABS: 20.0000 mg | ORAL_TABLET | Freq: Every day | ORAL | 3 refills | Status: DC

## 2017-01-05 NOTE — Progress Notes (Signed)
BP 118/88 (BP Location: Left Arm, Patient Position: Sitting, Cuff Size: Large)   Pulse (!) 110   Temp 97.9 F (36.6 C) (Other (Comment))   Ht 5' 6.5" (1.689 m)   Wt 270 lb (122.5 kg)   LMP 12/19/2014   SpO2 98%   BMI 42.93 kg/m    Subjective:    Patient ID: Dominique Harrington, female    DOB: Jun 15, 1971, 46 y.o.   MRN: [id number]  HPI: Dominique Harrington is a 46 y.o. female presenting on 01/05/2017 for Hypertension and Hyperlipidemia   HPI  HA resolved (she went to ER for HA recently)  Pt states she hasn't eaten yet today and hasn't drank much.  She says she is feeling well.  Denies light-headedness.   She is still off the cigarettes!  Relevant past medical, surgical, family and social history reviewed and updated as indicated. Interim medical history since our last visit reviewed. Allergies and medications reviewed and updated.   Current Outpatient Prescriptions:  .  butalbital-acetaminophen-caffeine (FIORICET, ESGIC) 50-325-40 MG tablet, TAKE ONE TO TWO TABLETS BY MOUTH EVERY 6 HOURS AS NEEDED FOR HEADACHE, Disp: 20 tablet, Rfl: 0 .  hydrOXYzine (VISTARIL) 50 MG capsule, TAKE ONE CAPSULE BY MOUTH AT BEDTIME AS NEEDED, Disp: 30 capsule, Rfl: 0 .  lisinopril-hydrochlorothiazide (ZESTORETIC) 20-12.5 MG tablet, Take 1 tablet by mouth daily., Disp: 30 tablet, Rfl: 0 .  lovastatin (MEVACOR) 20 MG tablet, Take 1 tablet (20 mg total) by mouth at bedtime., Disp: 30 tablet, Rfl: 0 .  metoprolol (LOPRESSOR) 100 MG tablet, TAKE ONE TABLET BY MOUTH TWICE DAILY FOR BLOOD PRESSURE, Disp: 60 tablet, Rfl: 3 .  Omega-3 Fatty Acids (FISH OIL PO), Take 2,000 mg by mouth 2 (two) times daily. , Disp: , Rfl:    Review of Systems  Constitutional: Positive for fatigue. Negative for appetite change, chills, diaphoresis, fever and unexpected weight change.  HENT: Negative for congestion, dental problem, drooling, ear pain, facial swelling, hearing loss, mouth sores, sneezing, sore throat, trouble  swallowing and voice change.   Eyes: Negative for pain, discharge, redness, itching and visual disturbance.  Respiratory: Negative for cough, choking, shortness of breath and wheezing.   Cardiovascular: Negative for chest pain, palpitations and leg swelling.  Gastrointestinal: Negative for abdominal pain, blood in stool, constipation, diarrhea and vomiting.  Endocrine: Negative for cold intolerance, heat intolerance and polydipsia.  Genitourinary: Negative for decreased urine volume, dysuria and hematuria.  Musculoskeletal: Negative for arthralgias, back pain and gait problem.  Skin: Negative for rash.  Allergic/Immunologic: Negative for environmental allergies.  Neurological: Negative for seizures, syncope, light-headedness and headaches.  Hematological: Negative for adenopathy.  Psychiatric/Behavioral: Negative for agitation, dysphoric mood and suicidal ideas. The patient is not nervous/anxious.     Per HPI unless specifically indicated above     Objective:    BP 118/88 (BP Location: Left Arm, Patient Position: Sitting, Cuff Size: Large)   Pulse (!) 110   Temp 97.9 F (36.6 C) (Other (Comment))   Ht 5' 6.5" (1.689 m)   Wt 270 lb (122.5 kg)   LMP 12/19/2014   SpO2 98%   BMI 42.93 kg/m   Wt Readings from Last 3 Encounters:  01/05/17 270 lb (122.5 kg)  12/09/16 250 lb (113.4 kg)  12/08/16 250 lb (113.4 kg)    Physical Exam  Constitutional: She is oriented to person, place, and time. She appears well-developed and well-nourished.  HENT:  Head: Normocephalic and atraumatic.  Neck: Neck supple.  Cardiovascular: Normal rate and  regular rhythm.   Pulmonary/Chest: Effort normal and breath sounds normal.  Abdominal: Soft. Bowel sounds are normal. She exhibits no mass. There is no hepatosplenomegaly. There is no tenderness.  Musculoskeletal: She exhibits no edema.  Lymphadenopathy:    She has no cervical adenopathy.  Neurological: She is alert and oriented to person, place, and  time.  Skin: Skin is warm and dry.  Psychiatric: She has a normal mood and affect. Her behavior is normal.  Vitals reviewed.   Results for orders placed or performed in visit on 12/28/16  Comprehensive metabolic panel  Result Value Ref Range   Sodium 138 135 - 146 mmol/L   Potassium 4.3 3.5 - 5.3 mmol/L   Chloride 107 98 - 110 mmol/L   CO2 23 20 - 31 mmol/L   Glucose, Bld 92 65 - 99 mg/dL   BUN 13 7 - 25 mg/dL   Creat 1.61 0.96 - 0.45 mg/dL   Total Bilirubin 0.3 0.2 - 1.2 mg/dL   Alkaline Phosphatase 92 33 - 115 U/L   AST 15 10 - 35 U/L   ALT 15 6 - 29 U/L   Total Protein 6.7 6.1 - 8.1 g/dL   Albumin 4.3 3.6 - 5.1 g/dL   Calcium 9.7 8.6 - 40.9 mg/dL  Lipid panel  Result Value Ref Range   Cholesterol 271 (H) <200 mg/dL   Triglycerides 811 (H) <150 mg/dL   HDL 42 (L) >91 mg/dL   Total CHOL/HDL Ratio 6.5 (H) <5.0 Ratio   VLDL 72 (H) <30 mg/dL   LDL Cholesterol 478 (H) <100 mg/dL      Assessment & Plan:   Encounter Diagnoses  Name Primary?  . Migraine without aura and without status migrainosus, not intractable Yes  . Hyperlipidemia, unspecified hyperlipidemia type   . Morbid obesity (HCC)   . Essential hypertension, benign     -reviewed labs  With pt -Change to atorvastatin -pt to Continue lowfat diet and fish oil 4 daily.   -Sign up for [facility]- maxalt MLT and atorvastatin. Discussed changing from the fioricet to the maxalt and pt is in agreement.   -Gave fioricet to last until maxalt comes.  -Pt requests phenergan to use prn with HA  -Counseled pt to avoid meal skipping- also needs to drink plenty fluids, especiaaly water.  Discussed with pt pulse slightly fast today but since she is feeling well, exam normal and she hasn't eaten yet, feel pulse benign.  Pt understands and agrees  -follow up 1 month recheck vitals. Pt agrees to return if she starts feeling poorly

## 2017-02-03 ENCOUNTER — Ambulatory Visit: Payer: Self-pay | Admitting: Physician Assistant

## 2017-02-03 ENCOUNTER — Encounter: Payer: Self-pay | Admitting: Physician Assistant

## 2017-02-03 VITALS — BP 100/80 | HR 72 | Temp 97.5°F | Ht 66.5 in | Wt 272.2 lb

## 2017-02-03 DIAGNOSIS — E785 Hyperlipidemia, unspecified: Secondary | ICD-10-CM

## 2017-02-03 DIAGNOSIS — I10 Essential (primary) hypertension: Secondary | ICD-10-CM

## 2017-02-03 NOTE — Progress Notes (Signed)
BP 100/80 (BP Location: Left Arm, Patient Position: Sitting, Cuff Size: Large)   Pulse 72   Temp 97.5 F (36.4 C)   Ht 5' 6.5" (1.689 m)   Wt 272 lb 4 oz (123.5 kg)   LMP 12/19/2014   SpO2 98%   BMI 43.28 kg/m    Subjective:    Patient ID: Dominique Harrington, female    DOB: 1971-05-18, 46 y.o.   MRN: [id number]  HPI: Dominique Harrington is a 46 y.o. female presenting on 02/03/2017 for Follow-up   HPI   Pt is feeling well today.  Her vital signs are good.  She is not having any more dizziness  Pt has not yet received her maxalt and lipitor from [facility] (they are behind).  Relevant past medical, surgical, family and social history reviewed and updated as indicated. Interim medical history since our last visit reviewed. Allergies and medications reviewed and updated.   Current Outpatient Prescriptions:  .  butalbital-acetaminophen-caffeine (FIORICET, ESGIC) 50-325-40 MG tablet, TAKE 1 TO 2 TABLETS BY MOUTH EVERY 6 HOURS AS NEEDED FOR HEADACHE, Disp: 20 tablet, Rfl: 0 .  hydrOXYzine (VISTARIL) 50 MG capsule, TAKE 1 CAPSULE BY MOUTH AT BEDTIME AS NEEDED, Disp: 30 capsule, Rfl: 0 .  lisinopril-hydrochlorothiazide (ZESTORETIC) 20-12.5 MG tablet, Take 1 tablet by mouth daily., Disp: 30 tablet, Rfl: 0 .  lovastatin (MEVACOR) 20 MG tablet, Take 1 tablet (20 mg total) by mouth at bedtime., Disp: 30 tablet, Rfl: 0 .  metoprolol (LOPRESSOR) 100 MG tablet, TAKE ONE TABLET BY MOUTH TWICE DAILY FOR BLOOD PRESSURE, Disp: 60 tablet, Rfl: 3 .  Omega-3 Fatty Acids (FISH OIL PO), Take 2,000 mg by mouth 2 (two) times daily. , Disp: , Rfl:  .  promethazine (PHENERGAN) 25 MG tablet, 1/2 to 1 tablet po q 8 hour prn n/v, Disp: 20 tablet, Rfl: 0 .  atorvastatin (LIPITOR) 20 MG tablet, Take 1 tablet (20 mg total) by mouth daily. (Patient not taking: Reported on 02/03/2017), Disp: 90 tablet, Rfl: 3 .  rizatriptan (MAXALT-MLT) 5 MG disintegrating tablet, Take 1 tablet (5 mg total) by mouth as needed for migraine.  May repeat in 2 hours if needed (Patient not taking: Reported on 02/03/2017), Disp: 20 tablet, Rfl: 0   Review of Systems  Constitutional: Negative for appetite change, chills, diaphoresis, fatigue, fever and unexpected weight change.  HENT: Negative for congestion, dental problem, drooling, ear pain, facial swelling, hearing loss, mouth sores, sneezing, sore throat, trouble swallowing and voice change.   Eyes: Negative for pain, discharge, redness, itching and visual disturbance.  Respiratory: Negative for cough, choking, shortness of breath and wheezing.   Cardiovascular: Negative for chest pain, palpitations and leg swelling.  Gastrointestinal: Negative for abdominal pain, blood in stool, constipation, diarrhea and vomiting.  Endocrine: Negative for cold intolerance, heat intolerance and polydipsia.  Genitourinary: Negative for decreased urine volume, dysuria and hematuria.  Musculoskeletal: Negative for arthralgias, back pain and gait problem.  Skin: Negative for rash.  Allergic/Immunologic: Negative for environmental allergies.  Neurological: Positive for headaches. Negative for seizures, syncope and light-headedness.  Hematological: Negative for adenopathy.  Psychiatric/Behavioral: Negative for agitation, dysphoric mood and suicidal ideas. The patient is nervous/anxious.     Per HPI unless specifically indicated above     Objective:    BP 100/80 (BP Location: Left Arm, Patient Position: Sitting, Cuff Size: Large)   Pulse 72   Temp 97.5 F (36.4 C)   Ht 5' 6.5" (1.689 m)   Wt 272 lb 4  oz (123.5 kg)   LMP 12/19/2014   SpO2 98%   BMI 43.28 kg/m   Wt Readings from Last 3 Encounters:  02/03/17 272 lb 4 oz (123.5 kg)  01/05/17 270 lb (122.5 kg)  12/09/16 250 lb (113.4 kg)    Physical Exam  Constitutional: She is oriented to person, place, and time. She appears well-developed and well-nourished.  HENT:  Head: Normocephalic and atraumatic.  Neck: Neck supple.  Cardiovascular:  Normal rate and regular rhythm.   Pulmonary/Chest: Effort normal and breath sounds normal.  Abdominal: Soft. Bowel sounds are normal. She exhibits no mass. There is no hepatosplenomegaly. There is no tenderness.  Musculoskeletal: She exhibits no edema.  Lymphadenopathy:    She has no cervical adenopathy.  Neurological: She is alert and oriented to person, place, and time.  Skin: Skin is warm and dry.  Psychiatric: She has a normal mood and affect. Her behavior is normal.  Vitals reviewed.       Assessment & Plan:   Encounter Diagnoses  Name Primary?  . Hyperlipidemia, unspecified hyperlipidemia type Yes  . Essential hypertension, benign     -no changes today -will follow up in 4 months (time to receive the lipitor in the mail and take it for 3 months) with recheck lipids before appointment.  Pt to RTO sooner prn

## 2017-02-04 ENCOUNTER — Other Ambulatory Visit: Payer: Self-pay | Admitting: Physician Assistant

## 2017-03-08 ENCOUNTER — Other Ambulatory Visit: Payer: Self-pay | Admitting: Physician Assistant

## 2017-03-08 MED ORDER — LISINOPRIL-HYDROCHLOROTHIAZIDE 20-12.5 MG PO TABS: 1.0000 | ORAL_TABLET | Freq: Every day | ORAL | 0 refills | Status: DC

## 2017-03-08 MED ORDER — HYDROXYZINE PAMOATE 50 MG PO CAPS: 50.0000 mg | ORAL_CAPSULE | Freq: Every evening | ORAL | 0 refills | Status: DC | PRN

## 2017-03-08 MED ORDER — LISINOPRIL-HYDROCHLOROTHIAZIDE 20-12.5 MG PO TABS: 1.0000 | ORAL_TABLET | Freq: Every day | ORAL | 1 refills | Status: DC

## 2017-03-08 MED ORDER — RIZATRIPTAN BENZOATE 5 MG PO TBDP: 5.0000 mg | ORAL_TABLET | ORAL | 1 refills | Status: DC | PRN

## 2017-03-08 MED ORDER — METOPROLOL TARTRATE 100 MG PO TABS: ORAL_TABLET | ORAL | 0 refills | Status: DC

## 2017-03-08 MED ORDER — METOPROLOL TARTRATE 100 MG PO TABS: ORAL_TABLET | ORAL | 1 refills | Status: DC

## 2017-03-08 MED ORDER — PROMETHAZINE HCL 25 MG PO TABS: ORAL_TABLET | ORAL | 0 refills | Status: DC

## 2017-03-25 ENCOUNTER — Other Ambulatory Visit: Payer: Self-pay | Admitting: Physician Assistant

## 2017-03-25 DIAGNOSIS — I10 Essential (primary) hypertension: Secondary | ICD-10-CM

## 2017-03-25 DIAGNOSIS — E785 Hyperlipidemia, unspecified: Secondary | ICD-10-CM

## 2017-03-25 MED ORDER — RIZATRIPTAN BENZOATE 10 MG PO TBDP: 10.0000 mg | ORAL_TABLET | ORAL | 1 refills | Status: DC | PRN

## 2017-04-22 ENCOUNTER — Other Ambulatory Visit: Payer: Self-pay | Admitting: Physician Assistant

## 2017-06-01 ENCOUNTER — Other Ambulatory Visit (HOSPITAL_COMMUNITY)
Admission: RE | Admit: 2017-06-01 | Discharge: 2017-06-01 | Disposition: A | Discharge status: Home / Self Care | Payer: Self-pay | Source: Ambulatory Visit | Attending: Physician Assistant | Admitting: Physician Assistant

## 2017-06-01 DIAGNOSIS — I10 Essential (primary) hypertension: Secondary | ICD-10-CM | POA: Insufficient documentation

## 2017-06-01 DIAGNOSIS — E785 Hyperlipidemia, unspecified: Secondary | ICD-10-CM | POA: Insufficient documentation

## 2017-06-01 LAB — COMPREHENSIVE METABOLIC PANEL
ALBUMIN: 3.8 g/dL (ref 3.5–5.0)
ALT: 62 U/L — ABNORMAL HIGH (ref 14–54)
ANION GAP: 9 (ref 5–15)
AST: 39 U/L (ref 15–41)
Alkaline Phosphatase: 96 U/L (ref 38–126)
BUN: 10 mg/dL (ref 6–20)
CHLORIDE: 106 mmol/L (ref 101–111)
CO2: 24 mmol/L (ref 22–32)
Calcium: 9.1 mg/dL (ref 8.9–10.3)
Creatinine, Ser: 0.8 mg/dL (ref 0.44–1.00)
GFR calc non Af Amer: >60 mL/min (ref >60)
GLUCOSE: 120 mg/dL — AB (ref 65–99)
POTASSIUM: 3.8 mmol/L (ref 3.5–5.1)
SODIUM: 139 mmol/L (ref 135–145)
Total Bilirubin: 0.3 mg/dL (ref 0.3–1.2)
Total Protein: 6.6 g/dL (ref 6.5–8.1)

## 2017-06-01 LAB — LIPID PANEL
CHOL/HDL RATIO: 3.9 ratio
Cholesterol: 164 mg/dL (ref 0–200)
HDL: 42 mg/dL (ref >40)
LDL CALC: 62 mg/dL (ref 0–99)
TRIGLYCERIDES: 302 mg/dL — AB (ref <150)
VLDL: 60 mg/dL — AB (ref 0–40)

## 2017-06-02 ENCOUNTER — Ambulatory Visit: Payer: Self-pay | Admitting: Physician Assistant

## 2017-06-02 ENCOUNTER — Encounter: Payer: Self-pay | Admitting: Physician Assistant

## 2017-06-02 VITALS — BP 150/90 | HR 75 | Temp 97.7°F | Ht 66.5 in | Wt 279.0 lb

## 2017-06-02 DIAGNOSIS — Z1239 Encounter for other screening for malignant neoplasm of breast: Secondary | ICD-10-CM

## 2017-06-02 DIAGNOSIS — G43009 Migraine without aura, not intractable, without status migrainosus: Secondary | ICD-10-CM

## 2017-06-02 DIAGNOSIS — I10 Essential (primary) hypertension: Secondary | ICD-10-CM

## 2017-06-02 DIAGNOSIS — F17219 Nicotine dependence, cigarettes, with unspecified nicotine-induced disorders: Secondary | ICD-10-CM

## 2017-06-02 DIAGNOSIS — E785 Hyperlipidemia, unspecified: Secondary | ICD-10-CM

## 2017-06-02 NOTE — Progress Notes (Signed)
BP (!) 150/90 (BP Location: Left Arm, Patient Position: Sitting, Cuff Size: Normal)   Pulse 75   Temp 97.7 F (36.5 C)   Ht 5' 6.5" (1.689 m)   Wt 279 lb (126.6 kg)   LMP 12/19/2014   SpO2 97%   BMI 44.36 kg/m    Subjective:    Patient ID: Dominique Harrington, female    DOB: 12/13/1970, 46 y.o.   MRN: [id number]  HPI: Dominique HasteCarrie P Razzano is a 46 y.o. female presenting on 06/02/2017 for Hypertension and Hyperlipidemia   HPI   [facility] and she feels better.  She is still smoking some.   No complaints today  Relevant past medical, surgical, family and social history reviewed and updated as indicated. Interim medical history since our last visit reviewed. Allergies and medications reviewed and updated.   Current Outpatient Prescriptions:  .  atorvastatin (LIPITOR) 20 MG tablet, Take 1 tablet (20 mg total) by mouth daily., Disp: 90 tablet, Rfl: 3 .  hydrOXYzine (VISTARIL) 50 MG capsule, Take 1 capsule (50 mg total) by mouth at bedtime as needed., Disp: 30 capsule, Rfl: 0 .  lisinopril-hydrochlorothiazide (ZESTORETIC) 20-12.5 MG tablet, Take 1 tablet by mouth daily., Disp: 90 tablet, Rfl: 1 .  metoprolol tartrate (LOPRESSOR) 100 MG tablet, TAKE ONE TABLET BY MOUTH TWICE DAILY FOR BLOOD PRESSURE, Disp: 120 tablet, Rfl: 1 .  Omega-3 Fatty Acids (FISH OIL PO), Take 2,000 mg by mouth 2 (two) times daily. , Disp: , Rfl:  .  promethazine (PHENERGAN) 25 MG tablet, TAKE 1/2 TO 1 (ONE-HALF TO ONE) TABLET BY MOUTH EVERY 8 HOURS AS NEEDED FOR NAUSEA AND VOMITING, Disp: 6 tablet, Rfl: 0 .  rizatriptan (MAXALT-MLT) 10 MG disintegrating tablet, Take 1 tablet (10 mg total) by mouth as needed for migraine. May repeat in 2 hours if needed, Disp: 20 tablet, Rfl: 1   Review of Systems  Constitutional: Positive for fatigue. Negative for appetite change, chills, diaphoresis, fever and unexpected weight change.  HENT: Negative for congestion, drooling, ear pain, facial  swelling, hearing loss, mouth sores, sneezing, sore throat, trouble swallowing and voice change.   Eyes: Negative for pain, discharge, redness, itching and visual disturbance.  Respiratory: Negative for cough, choking, shortness of breath and wheezing.   Cardiovascular: Negative for chest pain, palpitations and leg swelling.  Gastrointestinal: Negative for abdominal pain, blood in stool, constipation, diarrhea and vomiting.  Endocrine: Negative for cold intolerance, heat intolerance and polydipsia.  Genitourinary: Negative for decreased urine volume, dysuria and hematuria.  Musculoskeletal: Negative for arthralgias, back pain and gait problem.  Skin: Negative for rash.  Allergic/Immunologic: Negative for environmental allergies.  Neurological: Positive for headaches. Negative for seizures, syncope and light-headedness.  Hematological: Negative for adenopathy.  Psychiatric/Behavioral: Positive for dysphoric mood. Negative for agitation and suicidal ideas. The patient is nervous/anxious.     Per HPI unless specifically indicated above     Objective:    BP (!) 150/90 (BP Location: Left Arm, Patient Position: Sitting, Cuff Size: Normal)   Pulse 75   Temp 97.7 F (36.5 C)   Ht 5' 6.5" (1.689 m)   Wt 279 lb (126.6 kg)   LMP 12/19/2014   SpO2 97%   BMI 44.36 kg/m   Wt Readings from Last 3 Encounters:  06/02/17 279 lb (126.6 kg)  02/03/17 272 lb 4 oz (123.5 kg)  01/05/17 270 lb (122.5 kg)    Physical Exam  Constitutional: She is oriented to person,  place, and time. She appears well-developed and well-nourished.  HENT:  Head: Normocephalic and atraumatic.  Neck: Neck supple.  Cardiovascular: Normal rate and regular rhythm.   Pulmonary/Chest: Effort normal. No respiratory distress. She has wheezes (occassional soft expiratory). She has no rales.  Abdominal: Soft. Bowel sounds are normal. She exhibits no mass. There is no hepatosplenomegaly. There is no tenderness.  Musculoskeletal:  She exhibits no edema.  Lymphadenopathy:    She has no cervical adenopathy.  Neurological: She is alert and oriented to person, place, and time.  Skin: Skin is warm and dry.  Psychiatric: She has a normal mood and affect. Her behavior is normal.  Vitals reviewed.   Results for orders placed or performed during the hospital encounter of 06/01/17  Comprehensive metabolic panel  Result Value Ref Range   Sodium 139 135 - 145 mmol/L   Potassium 3.8 3.5 - 5.1 mmol/L   Chloride 106 101 - 111 mmol/L   CO2 24 22 - 32 mmol/L   Glucose, Bld 120 (H) 65 - 99 mg/dL   BUN 10 6 - 20 mg/dL   Creatinine, Ser 1.61 0.44 - 1.00 mg/dL   Calcium 9.1 8.9 - 09.6 mg/dL   Total Protein 6.6 6.5 - 8.1 g/dL   Albumin 3.8 3.5 - 5.0 g/dL   AST 39 15 - 41 U/L   ALT 62 (H) 14 - 54 U/L   Alkaline Phosphatase 96 38 - 126 U/L   Total Bilirubin 0.3 0.3 - 1.2 mg/dL   GFR calc non Af Amer >60 >60 mL/min   GFR calc Af Amer >60 >60 mL/min   Anion gap 9 5 - 15  Lipid panel  Result Value Ref Range   Cholesterol 164 0 - 200 mg/dL   Triglycerides 045 (H) <150 mg/dL   HDL 42 >40 mg/dL   Total CHOL/HDL Ratio 3.9 RATIO   VLDL 60 (H) 0 - 40 mg/dL   LDL Cholesterol 62 0 - 99 mg/dL      Assessment & Plan:    Encounter Diagnoses  Name Primary?  . Essential hypertension, benign Yes  . Hyperlipidemia, unspecified hyperlipidemia type   . Migraine without aura and without status migrainosus, not intractable   . Cigarette nicotine dependence with nicotine-induced disorder   . Morbid obesity (HCC)   . Screening for breast cancer     -reviewed labs with pt -will order screening Mammogram -will continue current medications -F/u 6 wk recheck to bp

## 2017-06-17 ENCOUNTER — Ambulatory Visit: Payer: Self-pay | Admitting: Physician Assistant

## 2017-06-22 ENCOUNTER — Other Ambulatory Visit: Payer: Self-pay | Admitting: Physician Assistant

## 2017-07-08 ENCOUNTER — Other Ambulatory Visit: Payer: Self-pay | Admitting: Physician Assistant

## 2017-07-08 DIAGNOSIS — Z1231 Encounter for screening mammogram for malignant neoplasm of breast: Secondary | ICD-10-CM

## 2017-07-15 ENCOUNTER — Ambulatory Visit: Payer: Self-pay | Admitting: Physician Assistant

## 2017-07-19 ENCOUNTER — Ambulatory Visit: Payer: Self-pay | Admitting: Physician Assistant

## 2017-07-21 ENCOUNTER — Encounter: Payer: Self-pay | Admitting: Physician Assistant

## 2017-07-21 ENCOUNTER — Ambulatory Visit: Payer: Self-pay | Admitting: Physician Assistant

## 2017-07-21 VITALS — BP 140/102 | HR 79 | Temp 97.7°F | Ht 66.5 in | Wt 283.2 lb

## 2017-07-21 DIAGNOSIS — M545 Low back pain, unspecified: Secondary | ICD-10-CM

## 2017-07-21 DIAGNOSIS — E785 Hyperlipidemia, unspecified: Secondary | ICD-10-CM

## 2017-07-21 DIAGNOSIS — I10 Essential (primary) hypertension: Secondary | ICD-10-CM

## 2017-07-21 MED ORDER — CYCLOBENZAPRINE HCL 10 MG PO TABS: 10.0000 mg | ORAL_TABLET | Freq: Three times a day (TID) | ORAL | 0 refills | Status: DC | PRN

## 2017-07-21 MED ORDER — DICLOFENAC SODIUM 75 MG PO TBEC: 75.0000 mg | DELAYED_RELEASE_TABLET | Freq: Two times a day (BID) | ORAL | 1 refills | Status: DC | PRN

## 2017-07-21 NOTE — Patient Instructions (Signed)

## 2017-07-21 NOTE — Progress Notes (Signed)
BP (!) 140/102 (BP Location: Left Arm, Patient Position: Sitting, Cuff Size: Normal)   Pulse 79   Temp 97.7 F (36.5 C)   Ht 5' 6.5" (1.689 m)   Wt 283 lb 4 oz (128.5 kg)   LMP 12/19/2014   SpO2 97%   BMI 45.03 kg/m    Subjective:    Patient ID: Dominique Harrington, female    DOB: [birth year], 46 y.o.   MRN: [id number]  HPI: Dominique Harrington is a 46 y.o. female presenting on 07/21/2017 for Hypertension and Back Pain (pt states she was moving furniture and thinks she may have pulled a muscle. pt states pain began monday evening. pt states she has been applying cold and heat and does not seem to be helping.)   HPI   Chief Complaint  Patient presents with  . Hypertension  . Back Pain    pt states she was moving furniture and thinks she may have pulled a muscle. pt states pain began monday evening. pt states she has been applying cold and heat and does not seem to be helping.    Pt has also tried IBU.    No radiation into LE. No numbness of extremities  Pt was moving the sofa so she could vacuum behind it.   Relevant past medical, surgical, family and social history reviewed and updated as indicated. Interim medical history since our last visit reviewed. Allergies and medications reviewed and updated.   Current Outpatient Prescriptions:  .  atorvastatin (LIPITOR) 20 MG tablet, Take 1 tablet (20 mg total) by mouth daily., Disp: 90 tablet, Rfl: 3 .  hydrOXYzine (VISTARIL) 50 MG capsule, Take 1 capsule (50 mg total) by mouth at bedtime as needed., Disp: 30 capsule, Rfl: 0 .  lisinopril-hydrochlorothiazide (PRINZIDE,ZESTORETIC) 20-12.5 MG tablet, TAKE 1 Tablet BY MOUTH ONCE DAILY, Disp: 90 tablet, Rfl: 2 .  MAXALT-MLT 10 MG disintegrating tablet, TAKE 1 TABLET BY MOUTH AT ONSET OF HEADACHE, MAY REPEAT IN 2 HOURS. MAX 2 TABLETS IN 24 HOURS., Disp: 20 tablet, Rfl: 1 .  metoprolol tartrate (LOPRESSOR) 100 MG tablet, TAKE 1 Tablet  BY MOUTH TWICE DAILY, Disp: 120 tablet, Rfl: 2 .  Omega-3  Fatty Acids (FISH OIL PO), Take 2,000 mg by mouth 2 (two) times daily. , Disp: , Rfl:  .  promethazine (PHENERGAN) 25 MG tablet, TAKE 1/2 TO 1 (ONE-HALF TO ONE) TABLET BY MOUTH EVERY 8 HOURS AS NEEDED FOR NAUSEA AND VOMITING (Patient not taking: Reported on 07/21/2017), Disp: 6 tablet, Rfl: 0   Review of Systems  Constitutional: Positive for fatigue. Negative for appetite change, chills, diaphoresis, fever and unexpected weight change.  HENT: Negative for congestion, dental problem, drooling, ear pain, facial swelling, hearing loss, mouth sores, sneezing, sore throat, trouble swallowing and voice change.   Eyes: Negative for pain, discharge, redness, itching and visual disturbance.  Respiratory: Negative for cough, choking, shortness of breath and wheezing.   Cardiovascular: Negative for chest pain, palpitations and leg swelling.  Gastrointestinal: Negative for abdominal pain, blood in stool, constipation, diarrhea and vomiting.  Endocrine: Negative for cold intolerance, heat intolerance and polydipsia.  Genitourinary: Negative for decreased urine volume, dysuria and hematuria.  Musculoskeletal: Positive for back pain. Negative for arthralgias and gait problem.  Skin: Negative for rash.  Allergic/Immunologic: Negative for environmental allergies.  Neurological: Positive for headaches. Negative for seizures, syncope and light-headedness.  Hematological: Negative for adenopathy.  Psychiatric/Behavioral: Negative for agitation, dysphoric mood and suicidal ideas. The patient is nervous/anxious.  Per HPI unless specifically indicated above     Objective:    BP (!) 140/102 (BP Location: Left Arm, Patient Position: Sitting, Cuff Size: Normal)   Pulse 79   Temp 97.7 F (36.5 C)   Ht 5' 6.5" (1.689 m)   Wt 283 lb 4 oz (128.5 kg)   LMP 12/19/2014   SpO2 97%   BMI 45.03 kg/m   Wt Readings from Last 3 Encounters:  07/21/17 283 lb 4 oz (128.5 kg)  06/02/17 279 lb (126.6 kg)  02/03/17 272  lb 4 oz (123.5 kg)    Physical Exam  Constitutional: She is oriented to person, place, and time. She appears well-developed and well-nourished.  HENT:  Head: Normocephalic and atraumatic.  Neck: Neck supple.  Cardiovascular: Normal rate and regular rhythm.   Pulmonary/Chest: Effort normal and breath sounds normal.  Abdominal: Soft. Bowel sounds are normal. She exhibits no mass. There is no hepatosplenomegaly. There is no tenderness.  Musculoskeletal: She exhibits no edema.       Lumbar back: She exhibits tenderness and pain. She exhibits normal range of motion, no bony tenderness, no swelling, no edema and no deformity.       Back:  Pain with moving/reclining/sitting up.  Diffusely tender.  No point tenderness.    SLR + R about 15 deg and + L about 45 degrees.  Lymphadenopathy:    She has no cervical adenopathy.  Neurological: She is alert and oriented to person, place, and time.  Skin: Skin is warm and dry.  Psychiatric: She has a normal mood and affect. Her behavior is normal.  Vitals reviewed.         Assessment & Plan:    Encounter Diagnoses  Name Primary?  . Acute midline low back pain without sciatica Yes  . Essential hypertension, benign   . Hyperlipidemia, unspecified hyperlipidemia type      -discussed treatment options for back.  Pt would like to reserve prednisone as second option.  Will rx diclofenac and flexeril.  Pt counseled to avoid other nsaids while on diclofenac and take it with food.  Pt is counseled to avoid driving on flexeril due to sedation.  Ice/heat.  Avoid overexertion.  Pt requested back exercises.  I gave her some but recommended she wait to start them until she improves some -pt will follow up in 6 weeks for htn, chol.  She will RTO sooner if needed for her back

## 2017-07-26 ENCOUNTER — Encounter: Payer: Self-pay | Admitting: Physician Assistant

## 2017-08-13 ENCOUNTER — Emergency Department (HOSPITAL_COMMUNITY)
Admission: EM | Admit: 2017-08-13 | Discharge: 2017-08-13 | Disposition: A | Discharge status: Home / Self Care | Payer: Self-pay | Attending: Emergency Medicine | Admitting: Emergency Medicine

## 2017-08-13 ENCOUNTER — Encounter (HOSPITAL_COMMUNITY): Payer: Self-pay

## 2017-08-13 DIAGNOSIS — I10 Essential (primary) hypertension: Secondary | ICD-10-CM | POA: Insufficient documentation

## 2017-08-13 DIAGNOSIS — Z79899 Other long term (current) drug therapy: Secondary | ICD-10-CM | POA: Insufficient documentation

## 2017-08-13 DIAGNOSIS — K0889 Other specified disorders of teeth and supporting structures: Secondary | ICD-10-CM | POA: Insufficient documentation

## 2017-08-13 DIAGNOSIS — F17210 Nicotine dependence, cigarettes, uncomplicated: Secondary | ICD-10-CM | POA: Insufficient documentation

## 2017-08-13 MED ORDER — PENICILLIN V POTASSIUM 500 MG PO TABS: 500.0000 mg | ORAL_TABLET | Freq: Three times a day (TID) | ORAL | 0 refills | Status: DC

## 2017-08-13 MED ORDER — HYDROCODONE-ACETAMINOPHEN 5-325 MG PO TABS
1.0000 | ORAL_TABLET | Freq: Once | ORAL | Status: AC
  Administered 2017-08-13: 1 via ORAL
  Filled 2017-08-13: qty 1

## 2017-08-13 MED ORDER — PENICILLIN V POTASSIUM 250 MG PO TABS
500.0000 mg | ORAL_TABLET | Freq: Once | ORAL | Status: AC
  Administered 2017-08-13: 500 mg via ORAL
  Filled 2017-08-13: qty 2

## 2017-08-13 MED ORDER — NAPROXEN 500 MG PO TABS: 500.0000 mg | ORAL_TABLET | Freq: Two times a day (BID) | ORAL | 0 refills | Status: DC

## 2017-08-13 NOTE — ED Provider Notes (Signed)
[facility]Y [facility]PART[facility] Provider Note   CSN: [id number] Arrival date & time: 08/13/17  1459     History   Chief Complaint Chief Complaint  Patient presents with  . Dental Pain    HPI Dominique Harrington is a 46 y.o. female.  HPI   Dominique Harrington is a 46 y.o. female who presents to the Emergency Department complaining of dental pain for 3 days.  She states that her left upper tooth "chipped off" while eating a piece of candy.  She describes a sharp stabbing pain to her tooth that radiates through her left face and toward her left ear.  She is tried ibuprofen and over-the-counter Orajel without relief.  She has a dental appointment next week, but states she can no longer tolerate the pain.  She denies neck pain, difficulty swallowing or breathing, fever, chills, and facial swelling.  Past Medical History:  Diagnosis Date  . Anxiety   . Dyspnea on exertion 2013   Echo, EF =>55%  . Hypertension   . Migraines   . Tachycardia     Patient Active Problem List   Diagnosis Date Noted  . Obesity, unspecified 11/11/2015  . Essential hypertension, benign 08/12/2015  . Hyperlipidemia 08/12/2015  . Cigarette nicotine dependence, uncomplicated 08/12/2015  . Headache, migraine 08/12/2015    Past Surgical History:  Procedure Laterality Date  . BACK SURGERY    . COSMETIC SURGERY    . HERNIA REPAIR    . TUBAL LIGATION  2004    OB History    Gravida Para Term Preterm AB Living   2 2 2     2    SAB TAB Ectopic Multiple Live Births                   Home Medications    Prior to Admission medications   Medication Sig Start Date End Date Taking? Authorizing Provider  atorvastatin (LIPITOR) 20 MG tablet Take 1 tablet (20 mg total) by mouth daily. 01/05/17   Jacquelin HawkingMcElroy, Shannon, PA-C  cyclobenzaprine (FLEXERIL) 10 MG tablet Take 1 tablet (10 mg total) by mouth 3 (three) times daily as needed for muscle spasms. 07/21/17   Jacquelin HawkingMcElroy, Shannon, PA-C  diclofenac (VOLTAREN) 75 MG EC  tablet Take 1 tablet (75 mg total) by mouth 2 (two) times daily as needed. 07/21/17   Jacquelin HawkingMcElroy, Shannon, PA-C  hydrOXYzine (VISTARIL) 50 MG capsule Take 1 capsule (50 mg total) by mouth at bedtime as needed. 03/08/17   Jacquelin HawkingMcElroy, Shannon, PA-C  lisinopril-hydrochlorothiazide (PRINZIDE,ZESTORETIC) 20-12.5 MG tablet TAKE 1 Tablet BY MOUTH ONCE DAILY 06/23/17   Jacquelin HawkingMcElroy, Shannon, PA-C  MAXALT-MLT 10 MG disintegrating tablet TAKE 1 TABLET BY MOUTH AT ONSET OF HEADACHE, MAY REPEAT IN 2 HOURS. MAX 2 TABLETS IN 24 HOURS. 06/23/17   Jacquelin HawkingMcElroy, Shannon, PA-C  metoprolol tartrate (LOPRESSOR) 100 MG tablet TAKE 1 Tablet  BY MOUTH TWICE DAILY 06/23/17   Jacquelin HawkingMcElroy, Shannon, PA-C  Omega-3 Fatty Acids (FISH OIL PO) Take 2,000 mg by mouth 2 (two) times daily.     [provider]  promethazine (PHENERGAN) 25 MG tablet TAKE 1/2 TO 1 (ONE-HALF TO ONE) TABLET BY MOUTH EVERY 8 HOURS AS NEEDED FOR NAUSEA AND VOMITING Patient not taking: Reported on 07/21/2017 04/23/17   Jacquelin HawkingMcElroy, Shannon, PA-C    Family History Family History  Problem Relation Age of Onset  . Heart disease Father     Social History Social History   Tobacco Use  . Smoking status: Current Every Day Smoker  Packs/day: 0.50    Years: 26.00    Pack years: 13.00    Types: Cigarettes  . Smokeless tobacco: Never Used  Substance Use Topics  . Alcohol use: No  . Drug use: No     Allergies   Sulfa antibiotics   Review of Systems Review of Systems  Constitutional: Negative for appetite change and fever.  HENT: Positive for dental problem. Negative for congestion, facial swelling, sore throat and trouble swallowing.   Eyes: Negative for pain and visual disturbance.  Musculoskeletal: Negative for neck pain and neck stiffness.  Neurological: Negative for dizziness, facial asymmetry and headaches.  Hematological: Negative for adenopathy.  All other systems reviewed and are negative.    Physical Exam Updated Vital Signs BP (!) 162/112 (BP  Location: Right Arm)   Pulse 74   Temp 98.2 F (36.8 C) (Oral)   Resp 18   Ht 5\' 7"  (1.702 m)   Wt 121.6 kg (268 lb)   LMP 12/19/2014   SpO2 100%   BMI 41.97 kg/m   Physical Exam  Constitutional: She is oriented to person, place, and time. She appears well-developed and well-nourished. No distress.  HENT:  Head: Normocephalic and atraumatic.  Right Ear: Tympanic membrane and ear canal normal.  Left Ear: Tympanic membrane and ear canal normal.  Mouth/Throat: Uvula is midline, oropharynx is clear and moist and mucous membranes are normal. No trismus in the jaw. Dental caries present. No dental abscesses or uvula swelling.  ttp and dental caries of the left upper first premolar .  No facial swelling, obvious dental abscess, trismus, or sublingual abnml.    Neck: Normal range of motion. Neck supple.  Cardiovascular: Normal rate, regular rhythm and normal heart sounds.  No murmur heard. Pulmonary/Chest: Effort normal and breath sounds normal.  Musculoskeletal: Normal range of motion.  Lymphadenopathy:    She has no cervical adenopathy.  Neurological: She is alert and oriented to person, place, and time. She exhibits normal muscle tone. Coordination normal.  Skin: Skin is warm and dry.  Nursing note and vitals reviewed.    ED Treatments / Results  Labs (all labs ordered are listed, but only abnormal results are displayed) Labs Reviewed - No data to display  EKG  EKG Interpretation None       Radiology No results found.  Procedures Procedures (including critical care time)  Medications Ordered in ED Medications  penicillin v potassium (VEETID) tablet 500 mg (not administered)  HYDROcodone-acetaminophen (NORCO/VICODIN) 5-325 MG per tablet 1 tablet (not administered)     Initial Impression / Assessment and Plan / ED Course  I have reviewed the triage vital signs and the nursing notes.  Pertinent labs & imaging results that were available during my care of the  patient were reviewed by me and considered in my medical decision making (see chart for details).     Pt is well appearing.  No concerning sx's for Ludwig's angina or dental abscess.  Pt has appt with her dentist next week  Final Clinical Impressions(s) / ED Diagnoses   Final diagnoses:  Pain, dental    ED Discharge Orders    None       Pauline Ausriplett, Rochella Benner, PA-C 08/13/17 1627    Eber HongMiller, Brian, MD 08/14/17 1606

## 2017-08-13 NOTE — ED Triage Notes (Signed)
Pt c/o pain from broken tooth since Tuesday.  Has dentist appt next Tuesday.

## 2017-08-13 NOTE — Discharge Instructions (Signed)
Warm salt water rinses.  Be sure to keep your appointment with your dentist for next week.  Return here for any worsening symptoms.

## 2017-08-31 ENCOUNTER — Other Ambulatory Visit (HOSPITAL_COMMUNITY)
Admission: RE | Admit: 2017-08-31 | Discharge: 2017-08-31 | Disposition: A | Discharge status: Home / Self Care | Payer: Self-pay | Source: Ambulatory Visit | Attending: Physician Assistant | Admitting: Physician Assistant

## 2017-08-31 DIAGNOSIS — E785 Hyperlipidemia, unspecified: Secondary | ICD-10-CM | POA: Insufficient documentation

## 2017-08-31 DIAGNOSIS — I10 Essential (primary) hypertension: Secondary | ICD-10-CM | POA: Insufficient documentation

## 2017-08-31 LAB — COMPREHENSIVE METABOLIC PANEL
ALK PHOS: 122 U/L (ref 38–126)
ALT: 22 U/L (ref 14–54)
AST: 28 U/L (ref 15–41)
Albumin: 4.2 g/dL (ref 3.5–5.0)
Anion gap: 10 (ref 5–15)
BILIRUBIN TOTAL: 0.4 mg/dL (ref 0.3–1.2)
BUN: 17 mg/dL (ref 6–20)
CHLORIDE: 105 mmol/L (ref 101–111)
CO2: 22 mmol/L (ref 22–32)
Calcium: 9.6 mg/dL (ref 8.9–10.3)
Creatinine, Ser: 1.03 mg/dL — ABNORMAL HIGH (ref 0.44–1.00)
Glucose, Bld: 141 mg/dL — ABNORMAL HIGH (ref 65–99)
Potassium: 4 mmol/L (ref 3.5–5.1)
Sodium: 137 mmol/L (ref 135–145)
Total Protein: 7.2 g/dL (ref 6.5–8.1)

## 2017-08-31 LAB — LIPID PANEL
CHOL/HDL RATIO: 4.2 ratio
Cholesterol: 130 mg/dL (ref 0–200)
HDL: 31 mg/dL — AB (ref >40)
LDL CALC: 51 mg/dL (ref 0–99)
Triglycerides: 242 mg/dL — ABNORMAL HIGH (ref <150)
VLDL: 48 mg/dL — ABNORMAL HIGH (ref 0–40)

## 2017-09-01 ENCOUNTER — Encounter: Payer: Self-pay | Admitting: Physician Assistant

## 2017-09-01 ENCOUNTER — Ambulatory Visit: Payer: Self-pay | Admitting: Physician Assistant

## 2017-09-01 VITALS — BP 118/86 | HR 71 | Temp 97.5°F | Ht 66.5 in | Wt 277.0 lb

## 2017-09-01 DIAGNOSIS — Z1239 Encounter for other screening for malignant neoplasm of breast: Secondary | ICD-10-CM

## 2017-09-01 DIAGNOSIS — E785 Hyperlipidemia, unspecified: Secondary | ICD-10-CM

## 2017-09-01 DIAGNOSIS — G43009 Migraine without aura, not intractable, without status migrainosus: Secondary | ICD-10-CM

## 2017-09-01 DIAGNOSIS — G4700 Insomnia, unspecified: Secondary | ICD-10-CM

## 2017-09-01 DIAGNOSIS — F17210 Nicotine dependence, cigarettes, uncomplicated: Secondary | ICD-10-CM

## 2017-09-01 DIAGNOSIS — N289 Disorder of kidney and ureter, unspecified: Secondary | ICD-10-CM

## 2017-09-01 DIAGNOSIS — I10 Essential (primary) hypertension: Secondary | ICD-10-CM

## 2017-09-01 MED ORDER — HYDROXYZINE PAMOATE 50 MG PO CAPS: 50.0000 mg | ORAL_CAPSULE | Freq: Every evening | ORAL | 1 refills | Status: DC | PRN

## 2017-09-01 NOTE — Progress Notes (Signed)
BP 118/86   Pulse 71   Temp (!) 97.5 F (36.4 C)   Ht 5' 6.5" (1.689 m)   Wt 277 lb (125.6 kg)   LMP 12/19/2014   SpO2 95%   BMI 44.04 kg/m    Subjective:    Patient ID: Dominique Harrington, female    DOB: 02/07/1971, 46 y.o.   MRN: [id number]  HPI: Dominique Harrington is a 46 y.o. female presenting on 09/01/2017 for Hypertension and Hyperlipidemia   HPI  Pt is feeling well today and has no complaints.  Pt says no one ever called her to schedule the mammogram that was ordered months ago.  Pt requests refill hydroxyzine which she uses prn insomnia.   Relevant past medical, surgical, family and social history reviewed and updated as indicated. Interim medical history since our last visit reviewed. Allergies and medications reviewed and updated.   Current Outpatient Medications:  .  atorvastatin (LIPITOR) 20 MG tablet, Take 1 tablet (20 mg total) by mouth daily., Disp: 90 tablet, Rfl: 3 .  hydrOXYzine (VISTARIL) 50 MG capsule, Take 1 capsule (50 mg total) by mouth at bedtime as needed., Disp: 30 capsule, Rfl: 0 .  lisinopril-hydrochlorothiazide (PRINZIDE,ZESTORETIC) 20-12.5 MG tablet, TAKE 1 Tablet BY MOUTH ONCE DAILY, Disp: 90 tablet, Rfl: 2 .  MAXALT-MLT 10 MG disintegrating tablet, TAKE 1 TABLET BY MOUTH AT ONSET OF HEADACHE, MAY REPEAT IN 2 HOURS. MAX 2 TABLETS IN 24 HOURS., Disp: 20 tablet, Rfl: 1 .  metoprolol tartrate (LOPRESSOR) 100 MG tablet, TAKE 1 Tablet  BY MOUTH TWICE DAILY, Disp: 120 tablet, Rfl: 2 .  Omega-3 Fatty Acids (FISH OIL PO), Take 2,000 mg by mouth 2 (two) times daily. , Disp: , Rfl:  .  promethazine (PHENERGAN) 25 MG tablet, TAKE 1/2 TO 1 (ONE-HALF TO ONE) TABLET BY MOUTH EVERY 8 HOURS AS NEEDED FOR NAUSEA AND VOMITING, Disp: 6 tablet, Rfl: 0   Review of Systems  Constitutional: Positive for fatigue. Negative for appetite change, chills, diaphoresis, fever and unexpected weight change.  HENT: Negative for congestion, dental problem, drooling, ear pain, facial  swelling, hearing loss, mouth sores, sneezing, sore throat, trouble swallowing and voice change.   Eyes: Negative for pain, discharge, redness, itching and visual disturbance.  Respiratory: Negative for cough, choking, shortness of breath and wheezing.   Cardiovascular: Negative for chest pain, palpitations and leg swelling.  Gastrointestinal: Negative for abdominal pain, blood in stool, constipation, diarrhea and vomiting.  Endocrine: Negative for cold intolerance, heat intolerance and polydipsia.  Genitourinary: Negative for decreased urine volume, dysuria and hematuria.  Musculoskeletal: Negative for arthralgias, back pain and gait problem.  Skin: Negative for rash.  Allergic/Immunologic: Negative for environmental allergies.  Neurological: Positive for headaches. Negative for seizures, syncope and light-headedness.  Hematological: Negative for adenopathy.  Psychiatric/Behavioral: Positive for dysphoric mood. Negative for agitation and suicidal ideas. The patient is nervous/anxious.     Per HPI unless specifically indicated above     Objective:    BP 118/86   Pulse 71   Temp (!) 97.5 F (36.4 C)   Ht 5' 6.5" (1.689 m)   Wt 277 lb (125.6 kg)   LMP 12/19/2014   SpO2 95%   BMI 44.04 kg/m   Wt Readings from Last 3 Encounters:  09/01/17 277 lb (125.6 kg)  08/13/17 268 lb (121.6 kg)  07/21/17 283 lb 4 oz (128.5 kg)    Physical Exam  Constitutional: She is oriented to person, place, and time. She appears well-developed and  well-nourished.  HENT:  Head: Normocephalic and atraumatic.  Neck: Neck supple.  Cardiovascular: Normal rate and regular rhythm.  Pulmonary/Chest: Effort normal and breath sounds normal.  Abdominal: Soft. Bowel sounds are normal. She exhibits no mass. There is no hepatosplenomegaly. There is no tenderness.  Musculoskeletal: She exhibits no edema.  Lymphadenopathy:    She has no cervical adenopathy.  Neurological: She is alert and oriented to person, place,  and time.  Skin: Skin is warm and dry.  Psychiatric: She has a normal mood and affect. Her behavior is normal.  Vitals reviewed.   Results for orders placed or performed during the hospital encounter of 08/31/17  Comprehensive metabolic panel  Result Value Ref Range   Sodium 137 135 - 145 mmol/L   Potassium 4.0 3.5 - 5.1 mmol/L   Chloride 105 101 - 111 mmol/L   CO2 22 22 - 32 mmol/L   Glucose, Bld 141 (H) 65 - 99 mg/dL   BUN 17 6 - 20 mg/dL   Creatinine, Ser 2.131.03 (H) 0.44 - 1.00 mg/dL   Calcium 9.6 8.9 - 08.610.3 mg/dL   Total Protein 7.2 6.5 - 8.1 g/dL   Albumin 4.2 3.5 - 5.0 g/dL   AST 28 15 - 41 U/L   ALT 22 14 - 54 U/L   Alkaline Phosphatase 122 38 - 126 U/L   Total Bilirubin 0.4 0.3 - 1.2 mg/dL   GFR calc non Af Amer >60 >60 mL/min   GFR calc Af Amer >60 >60 mL/min   Anion gap 10 5 - 15  Lipid panel  Result Value Ref Range   Cholesterol 130 0 - 200 mg/dL   Triglycerides 578242 (H) <150 mg/dL   HDL 31 (L) >46>40 mg/dL   Total CHOL/HDL Ratio 4.2 RATIO   VLDL 48 (H) 0 - 40 mg/dL   LDL Cholesterol 51 0 - 99 mg/dL      Assessment & Plan:   Encounter Diagnoses  Name Primary?  . Essential hypertension, benign Yes  . Hyperlipidemia, unspecified hyperlipidemia type   . Screening for breast cancer   . Cigarette nicotine dependence, uncomplicated   . Insomnia, unspecified type   . Migraine without aura and without status migrainosus, not intractable   . Morbid obesity (HCC)   . Abnormal kidney function      -reviewed labs with pt -will Re-order screening mammogram -pt to Continue current meds -pt was Counseled on smoking cessation -pt to Follow up 3 months.  RTO sooner prn

## 2017-09-12 ENCOUNTER — Encounter (HOSPITAL_COMMUNITY): Payer: Self-pay | Admitting: Emergency Medicine

## 2017-09-12 ENCOUNTER — Observation Stay (HOSPITAL_COMMUNITY)
Admission: EM | Admit: 2017-09-12 | Discharge: 2017-09-14 | Disposition: A | Discharge status: Home / Self Care | Payer: Medicaid Other | Attending: Internal Medicine | Admitting: Internal Medicine

## 2017-09-12 ENCOUNTER — Observation Stay (HOSPITAL_COMMUNITY): Payer: Medicaid Other

## 2017-09-12 ENCOUNTER — Other Ambulatory Visit: Payer: Self-pay

## 2017-09-12 ENCOUNTER — Emergency Department (HOSPITAL_COMMUNITY): Payer: Medicaid Other

## 2017-09-12 DIAGNOSIS — G459 Transient cerebral ischemic attack, unspecified: Secondary | ICD-10-CM

## 2017-09-12 DIAGNOSIS — R471 Dysarthria and anarthria: Secondary | ICD-10-CM | POA: Diagnosis not present

## 2017-09-12 DIAGNOSIS — R40241 Glasgow coma scale score 13-15, unspecified time: Secondary | ICD-10-CM | POA: Insufficient documentation

## 2017-09-12 DIAGNOSIS — R4781 Slurred speech: Secondary | ICD-10-CM | POA: Diagnosis not present

## 2017-09-12 DIAGNOSIS — F17210 Nicotine dependence, cigarettes, uncomplicated: Secondary | ICD-10-CM | POA: Diagnosis present

## 2017-09-12 DIAGNOSIS — I10 Essential (primary) hypertension: Secondary | ICD-10-CM | POA: Diagnosis present

## 2017-09-12 DIAGNOSIS — R519 Headache, unspecified: Secondary | ICD-10-CM

## 2017-09-12 DIAGNOSIS — G8194 Hemiplegia, unspecified affecting left nondominant side: Secondary | ICD-10-CM

## 2017-09-12 DIAGNOSIS — Z79899 Other long term (current) drug therapy: Secondary | ICD-10-CM | POA: Diagnosis not present

## 2017-09-12 DIAGNOSIS — Z23 Encounter for immunization: Secondary | ICD-10-CM | POA: Diagnosis not present

## 2017-09-12 DIAGNOSIS — R51 Headache: Secondary | ICD-10-CM

## 2017-09-12 DIAGNOSIS — R200 Anesthesia of skin: Secondary | ICD-10-CM | POA: Diagnosis present

## 2017-09-12 DIAGNOSIS — N390 Urinary tract infection, site not specified: Secondary | ICD-10-CM | POA: Diagnosis not present

## 2017-09-12 DIAGNOSIS — R531 Weakness: Secondary | ICD-10-CM | POA: Insufficient documentation

## 2017-09-12 DIAGNOSIS — E782 Mixed hyperlipidemia: Secondary | ICD-10-CM | POA: Diagnosis present

## 2017-09-12 DIAGNOSIS — E669 Obesity, unspecified: Secondary | ICD-10-CM | POA: Diagnosis present

## 2017-09-12 DIAGNOSIS — G43909 Migraine, unspecified, not intractable, without status migrainosus: Secondary | ICD-10-CM | POA: Diagnosis not present

## 2017-09-12 DIAGNOSIS — R209 Unspecified disturbances of skin sensation: Secondary | ICD-10-CM

## 2017-09-12 DIAGNOSIS — E785 Hyperlipidemia, unspecified: Secondary | ICD-10-CM | POA: Diagnosis present

## 2017-09-12 LAB — CBC
HEMATOCRIT: 40.6 % (ref 36.0–46.0)
HEMOGLOBIN: 13.1 g/dL (ref 12.0–15.0)
MCH: 31 pg (ref 26.0–34.0)
MCHC: 32.3 g/dL (ref 30.0–36.0)
MCV: 96.2 fL (ref 78.0–100.0)
Platelets: 218 10*3/uL (ref 150–400)
RBC: 4.22 MIL/uL (ref 3.87–5.11)
RDW: 13.5 % (ref 11.5–15.5)
WBC: 6.5 10*3/uL (ref 4.0–10.5)

## 2017-09-12 LAB — I-STAT CHEM 8, ED
BUN: 9 mg/dL (ref 6–20)
CALCIUM ION: 1.22 mmol/L (ref 1.15–1.40)
CREATININE: 0.9 mg/dL (ref 0.44–1.00)
Chloride: 104 mmol/L (ref 101–111)
GLUCOSE: 102 mg/dL — AB (ref 65–99)
HCT: 38 % (ref 36.0–46.0)
Hemoglobin: 12.9 g/dL (ref 12.0–15.0)
Potassium: 4.4 mmol/L (ref 3.5–5.1)
SODIUM: 140 mmol/L (ref 135–145)
TCO2: 28 mmol/L (ref 22–32)

## 2017-09-12 LAB — DIFFERENTIAL
Basophils Absolute: 0 10*3/uL (ref 0.0–0.1)
Basophils Relative: 1 %
EOS ABS: 0.3 10*3/uL (ref 0.0–0.7)
EOS PCT: 4 %
Lymphocytes Relative: 43 %
Lymphs Abs: 2.8 10*3/uL (ref 0.7–4.0)
MONOS PCT: 5 %
Monocytes Absolute: 0.3 10*3/uL (ref 0.1–1.0)
Neutro Abs: 3 10*3/uL (ref 1.7–7.7)
Neutrophils Relative %: 47 %

## 2017-09-12 LAB — COMPREHENSIVE METABOLIC PANEL
ALK PHOS: 119 U/L (ref 38–126)
ALT: 20 U/L (ref 14–54)
AST: 20 U/L (ref 15–41)
Albumin: 3.9 g/dL (ref 3.5–5.0)
Anion gap: 7 (ref 5–15)
BILIRUBIN TOTAL: 0.4 mg/dL (ref 0.3–1.2)
BUN: 10 mg/dL (ref 6–20)
CALCIUM: 9.6 mg/dL (ref 8.9–10.3)
CO2: 26 mmol/L (ref 22–32)
Chloride: 105 mmol/L (ref 101–111)
Creatinine, Ser: 0.87 mg/dL (ref 0.44–1.00)
GFR calc Af Amer: >60 mL/min (ref >60)
Glucose, Bld: 109 mg/dL — ABNORMAL HIGH (ref 65–99)
POTASSIUM: 4.2 mmol/L (ref 3.5–5.1)
Sodium: 138 mmol/L (ref 135–145)
TOTAL PROTEIN: 6.9 g/dL (ref 6.5–8.1)

## 2017-09-12 LAB — CBG MONITORING, ED: Glucose-Capillary: 101 mg/dL — ABNORMAL HIGH (ref 65–99)

## 2017-09-12 LAB — I-STAT TROPONIN, ED: TROPONIN I, POC: 0 ng/mL (ref 0.00–0.08)

## 2017-09-12 LAB — PROTIME-INR
INR: 0.95
Prothrombin Time: 12.6 seconds (ref 11.4–15.2)

## 2017-09-12 LAB — APTT: aPTT: 30 seconds (ref 24–36)

## 2017-09-12 LAB — ETHANOL: Alcohol, Ethyl (B): <10 mg/dL (ref <10)

## 2017-09-12 MED ORDER — OMEGA-3-ACID ETHYL ESTERS 1 G PO CAPS
1.0000 g | ORAL_CAPSULE | Freq: Two times a day (BID) | ORAL | Status: DC
  Administered 2017-09-12 – 2017-09-14 (×4): 1 g via ORAL
  Filled 2017-09-12 (×4): qty 1

## 2017-09-12 MED ORDER — SODIUM CHLORIDE 0.9 % IV SOLN
INTRAVENOUS | Status: AC
  Administered 2017-09-12: 22:00:00 via INTRAVENOUS

## 2017-09-12 MED ORDER — ASPIRIN 81 MG PO CHEW
81.0000 mg | CHEWABLE_TABLET | Freq: Every day | ORAL | Status: DC
  Administered 2017-09-13 – 2017-09-14 (×2): 81 mg via ORAL
  Filled 2017-09-12 (×2): qty 1

## 2017-09-12 MED ORDER — ASPIRIN 325 MG PO TABS
325.0000 mg | ORAL_TABLET | Freq: Once | ORAL | Status: AC
  Administered 2017-09-12: 325 mg via ORAL
  Filled 2017-09-12: qty 1

## 2017-09-12 MED ORDER — ACETAMINOPHEN 325 MG PO TABS
650.0000 mg | ORAL_TABLET | ORAL | Status: DC | PRN
  Administered 2017-09-13 – 2017-09-14 (×2): 650 mg via ORAL
  Filled 2017-09-12 (×2): qty 2

## 2017-09-12 MED ORDER — TRAZODONE HCL 50 MG PO TABS
50.0000 mg | ORAL_TABLET | Freq: Once | ORAL | Status: AC
  Administered 2017-09-12: 50 mg via ORAL
  Filled 2017-09-12: qty 1

## 2017-09-12 MED ORDER — ATORVASTATIN CALCIUM 40 MG PO TABS
80.0000 mg | ORAL_TABLET | Freq: Every day | ORAL | Status: DC
  Administered 2017-09-13: 80 mg via ORAL
  Filled 2017-09-12 (×2): qty 1
  Filled 2017-09-12: qty 2

## 2017-09-12 MED ORDER — ACETAMINOPHEN 650 MG RE SUPP
650.0000 mg | RECTAL | Status: DC | PRN
Start: 2017-09-12 — End: 2017-09-14

## 2017-09-12 MED ORDER — NICOTINE 14 MG/24HR TD PT24
14.0000 mg | MEDICATED_PATCH | Freq: Every day | TRANSDERMAL | Status: DC
  Administered 2017-09-12 – 2017-09-14 (×3): 14 mg via TRANSDERMAL
  Filled 2017-09-12 (×3): qty 1

## 2017-09-12 MED ORDER — SENNOSIDES-DOCUSATE SODIUM 8.6-50 MG PO TABS: 1.0000 | ORAL_TABLET | Freq: Every evening | ORAL | Status: DC | PRN

## 2017-09-12 MED ORDER — ACETAMINOPHEN 160 MG/5ML PO SOLN: 650.0000 mg | ORAL | Status: DC | PRN

## 2017-09-12 MED ORDER — KETOROLAC TROMETHAMINE 15 MG/ML IJ SOLN
15.0000 mg | Freq: Four times a day (QID) | INTRAMUSCULAR | Status: DC | PRN
  Administered 2017-09-12 – 2017-09-13 (×4): 15 mg via INTRAVENOUS
  Filled 2017-09-12 (×4): qty 1

## 2017-09-12 MED ORDER — ENOXAPARIN SODIUM 40 MG/0.4ML ~~LOC~~ SOLN
40.0000 mg | SUBCUTANEOUS | Status: DC
  Administered 2017-09-12 – 2017-09-13 (×2): 40 mg via SUBCUTANEOUS
  Filled 2017-09-12 (×2): qty 0.4

## 2017-09-12 MED ORDER — INFLUENZA VAC SPLIT QUAD 0.5 ML IM SUSY
0.5000 mL | PREFILLED_SYRINGE | INTRAMUSCULAR | Status: AC
  Administered 2017-09-13: 0.5 mL via INTRAMUSCULAR
  Filled 2017-09-12: qty 0.5

## 2017-09-12 MED ORDER — ACETAMINOPHEN 500 MG PO TABS
1000.0000 mg | ORAL_TABLET | Freq: Once | ORAL | Status: AC
  Administered 2017-09-12: 1000 mg via ORAL
  Filled 2017-09-12: qty 2

## 2017-09-12 MED ORDER — STROKE: EARLY STAGES OF RECOVERY BOOK
Freq: Once | Status: AC
  Administered 2017-09-12: 22:00:00
  Filled 2017-09-12 (×2): qty 1

## 2017-09-12 NOTE — ED Notes (Signed)
Doctor to Doctor consult made for neurology at this time. Dominique Harrington

## 2017-09-12 NOTE — ED Provider Notes (Signed)
[facility] EMERGENCY DEPARTMENT Provider Note   CSN: [id number] Arrival date & time: 09/12/17  1556     History   Chief Complaint Chief Complaint  Patient presents with  . Aphasia    HPI Dominique Harrington is a 46 y.o. female.  HPI Patient awakened this morning with numbness over the lower left side of her face, drooling from the left side of her mouth when she attempted to drink and slurred speech.  She also had weakness in her left arm and left leg.  Facial numbness has improved but is not gone yet weakness in left arm and left leg continue.  Other associated symptoms include headache of the left side which does not resemble migraine in that there are no visual changes associated.  She treated herself with ibuprofen as well as her usual blood pressure and cholesterol medications.  Nothing makes symptoms better or worse.  No other associated symptoms  Past Medical History:  Diagnosis Date  . Anxiety   . Dyspnea on exertion 2013   Echo, EF =>55%  . Hypertension   . Migraines   . Tachycardia     Patient Active Problem List   Diagnosis Date Noted  . Obesity, unspecified 11/11/2015  . Essential hypertension, benign 08/12/2015  . Hyperlipidemia 08/12/2015  . Cigarette nicotine dependence, uncomplicated 08/12/2015  . Headache, migraine 08/12/2015    Past Surgical History:  Procedure Laterality Date  . BACK SURGERY    . COSMETIC SURGERY    . HERNIA REPAIR    . TUBAL LIGATION  2004    OB History    Gravida Para Term Preterm AB Living   2 2 2     2    SAB TAB Ectopic Multiple Live Births                   Home Medications    Prior to Admission medications   Medication Sig Start Date End Date Taking? Authorizing Provider  atorvastatin (LIPITOR) 20 MG tablet Take 1 tablet (20 mg total) by mouth daily. 01/05/17   Jacquelin Hawking, PA-C  hydrOXYzine (VISTARIL) 50 MG capsule Take 1 capsule (50 mg total) by mouth at bedtime as needed. 09/01/17   Jacquelin Hawking, PA-C    lisinopril-hydrochlorothiazide (PRINZIDE,ZESTORETIC) 20-12.5 MG tablet TAKE 1 Tablet BY MOUTH ONCE DAILY 06/23/17   Jacquelin Hawking, PA-C  MAXALT-MLT 10 MG disintegrating tablet TAKE 1 TABLET BY MOUTH AT ONSET OF HEADACHE, MAY REPEAT IN 2 HOURS. MAX 2 TABLETS IN 24 HOURS. 06/23/17   Jacquelin Hawking, PA-C  metoprolol tartrate (LOPRESSOR) 100 MG tablet TAKE 1 Tablet  BY MOUTH TWICE DAILY 06/23/17   Jacquelin Hawking, PA-C  Omega-3 Fatty Acids (FISH OIL PO) Take 2,000 mg by mouth 2 (two) times daily.     [provider]  promethazine (PHENERGAN) 25 MG tablet TAKE 1/2 TO 1 (ONE-HALF TO ONE) TABLET BY MOUTH EVERY 8 HOURS AS NEEDED FOR NAUSEA AND VOMITING 04/23/17   Jacquelin Hawking, PA-C    Family History Family History  Problem Relation Age of Onset  . Heart disease Father     Social History Social History   Tobacco Use  . Smoking status: Current Every Day Smoker    Packs/day: 0.50    Years: 26.00    Pack years: 13.00    Types: Cigarettes  . Smokeless tobacco: Never Used  Substance Use Topics  . Alcohol use: No  . Drug use: No     Allergies   Sulfa antibiotics  Review of Systems Review of Systems  HENT: Negative.   Respiratory: Negative.   Cardiovascular: Negative.   Gastrointestinal: Negative.   Genitourinary:       Amenorrheic times 3 years  Musculoskeletal: Negative.   Skin: Negative.   Neurological: Positive for speech difficulty, weakness, numbness and headaches.  Psychiatric/Behavioral: Negative.   All other systems reviewed and are negative.    Physical Exam Updated Vital Signs BP 113/81 (BP Location: Left Arm)   Pulse 62   Temp 97.8 F (36.6 C) (Oral)   Resp 16   Ht 5\' 6"  (1.676 m)   Wt 125.6 kg (277 lb)   LMP 12/19/2014   SpO2 97%   BMI 44.71 kg/m   Physical Exam  Constitutional: She appears well-developed and well-nourished.  HENT:  Head: Normocephalic and atraumatic.  Eyes: Conjunctivae are normal. Pupils are equal, round, and  reactive to light.  Neck: Neck supple. No tracheal deviation present. No thyromegaly present.  Cardiovascular: Normal rate and regular rhythm.  No murmur heard. Pulmonary/Chest: Effort normal and breath sounds normal.  Abdominal: Soft. Bowel sounds are normal. She exhibits no distension. There is no tenderness.  Obese  Musculoskeletal: Normal range of motion. She exhibits no edema or tenderness.  Neurological: She is alert. No cranial nerve deficit. Coordination normal.  Gait shuffling.  Pronator drift normal.  Motor strength 5/5 overall.  DTRs symmetric bilaterally at knee jerk and ankle jerk and biceps toes downgoing going bilaterally  Skin: Skin is warm and dry. No rash noted.  Psychiatric: She has a normal mood and affect.  Nursing note and vitals reviewed.    ED Treatments / Results  Labs (all labs ordered are listed, but only abnormal results are displayed) Labs Reviewed  PROTIME-INR  APTT  CBC  DIFFERENTIAL  COMPREHENSIVE METABOLIC PANEL  I-STAT TROPONIN, ED  CBG MONITORING, ED  I-STAT CHEM 8, ED    EKG  EKG Interpretation  Date/Time:  Sunday September 12 2017 16:10:30 EST Ventricular Rate:  61 PR Interval:  160 QRS Duration: 82 QT Interval:  462 QTC Calculation: 465 R Axis:   13 Text Interpretation:  Normal sinus rhythm Low voltage QRS Borderline ECG No old tracing to compare Confirmed by Doug Sou [phone]) on 09/12/2017 4:43:50 PM      Results for orders placed or performed during the hospital encounter of 09/12/17  Protime-INR  Result Value Ref Range   Prothrombin Time 12.6 11.4 - 15.2 seconds   INR 0.95   APTT  Result Value Ref Range   aPTT 30 24 - 36 seconds  CBC  Result Value Ref Range   WBC 6.5 4.0 - 10.5 K/uL   RBC 4.22 3.87 - 5.11 MIL/uL   Hemoglobin 13.1 12.0 - 15.0 g/dL   HCT 60.4 54.0 - 98.1 %   MCV 96.2 78.0 - 100.0 fL   MCH 31.0 26.0 - 34.0 pg   MCHC 32.3 30.0 - 36.0 g/dL   RDW 19.1 47.8 - 29.5 %   Platelets 218 150 - 400 K/uL   Differential  Result Value Ref Range   Neutrophils Relative % 47 %   Neutro Abs 3.0 1.7 - 7.7 K/uL   Lymphocytes Relative 43 %   Lymphs Abs 2.8 0.7 - 4.0 K/uL   Monocytes Relative 5 %   Monocytes Absolute 0.3 0.1 - 1.0 K/uL   Eosinophils Relative 4 %   Eosinophils Absolute 0.3 0.0 - 0.7 K/uL   Basophils Relative 1 %   Basophils Absolute 0.0 0.0 -  0.1 K/uL  Comprehensive metabolic panel  Result Value Ref Range   Sodium 138 135 - 145 mmol/L   Potassium 4.2 3.5 - 5.1 mmol/L   Chloride 105 101 - 111 mmol/L   CO2 26 22 - 32 mmol/L   Glucose, Bld 109 (H) 65 - 99 mg/dL   BUN 10 6 - 20 mg/dL   Creatinine, Ser 1.610.87 0.44 - 1.00 mg/dL   Calcium 9.6 8.9 - 09.610.3 mg/dL   Total Protein 6.9 6.5 - 8.1 g/dL   Albumin 3.9 3.5 - 5.0 g/dL   AST 20 15 - 41 U/L   ALT 20 14 - 54 U/L   Alkaline Phosphatase 119 38 - 126 U/L   Total Bilirubin 0.4 0.3 - 1.2 mg/dL   GFR calc non Af Amer >60 >60 mL/min   GFR calc Af Amer >60 >60 mL/min   Anion gap 7 5 - 15  Ethanol  Result Value Ref Range   Alcohol, Ethyl (B) <10 <10 mg/dL  I-stat troponin, ED  Result Value Ref Range   Troponin i, poc 0.00 0.00 - 0.08 ng/mL   Comment 3          CBG monitoring, ED  Result Value Ref Range   Glucose-Capillary 101 (H) 65 - 99 mg/dL  I-Stat Chem 8, ED  Result Value Ref Range   Sodium 140 135 - 145 mmol/L   Potassium 4.4 3.5 - 5.1 mmol/L   Chloride 104 101 - 111 mmol/L   BUN 9 6 - 20 mg/dL   Creatinine, Ser 0.450.90 0.44 - 1.00 mg/dL   Glucose, Bld 409102 (H) 65 - 99 mg/dL   Calcium, Ion 8.111.22 9.141.15 - 1.40 mmol/L   TCO2 28 22 - 32 mmol/L   Hemoglobin 12.9 12.0 - 15.0 g/dL   HCT 78.238.0 95.636.0 - 21.346.0 %   Ct Head Wo Contrast  Result Date: 09/12/2017 CLINICAL DATA:  Slurred speech, left-sided facial numbness EXAM: CT HEAD WITHOUT CONTRAST TECHNIQUE: Contiguous axial images were obtained from the base of the skull through the vertex without intravenous contrast. COMPARISON:  03/06/2011 FINDINGS: Brain: No acute intracranial  abnormality. Specifically, no hemorrhage, hydrocephalus, mass lesion, acute infarction, or significant intracranial injury. Vascular: No hyperdense vessel or unexpected calcification. Skull: No acute calvarial abnormality. Sinuses/Orbits: Visualized paranasal sinuses and mastoids clear. Orbital soft tissues unremarkable. Other: None IMPRESSION: Normal study. Electronically Signed   By: Charlett NoseKevin  Dover M.D.   On: 09/12/2017 16:40    Radiology Ct Head Wo Contrast  Result Date: 09/12/2017 CLINICAL DATA:  Slurred speech, left-sided facial numbness EXAM: CT HEAD WITHOUT CONTRAST TECHNIQUE: Contiguous axial images were obtained from the base of the skull through the vertex without intravenous contrast. COMPARISON:  03/06/2011 FINDINGS: Brain: No acute intracranial abnormality. Specifically, no hemorrhage, hydrocephalus, mass lesion, acute infarction, or significant intracranial injury. Vascular: No hyperdense vessel or unexpected calcification. Skull: No acute calvarial abnormality. Sinuses/Orbits: Visualized paranasal sinuses and mastoids clear. Orbital soft tissues unremarkable. Other: None IMPRESSION: Normal study. Electronically Signed   By: Charlett NoseKevin  Dover M.D.   On: 09/12/2017 16:40    Procedures Procedures (including critical care time)  Medications Ordered in ED Medications - No data to display   Initial Impression / Assessment and Plan / ED Course  I have reviewed the triage vital signs and the nursing notes.  Pertinent labs & imaging results that were available during my care of the patient were reviewed by me and considered in my medical decision making (see chart for details).  5:55 PM patient alert Glasgow Coma Score 15.  Moves all extremities.  She is requesting pain medication for headache and neck pain.  Tylenol ordered.  I spoke with Dr.Lue, telemetry neurologist by telephone he did not actually interview patient but states that she needs overnight stay for nonemergent stroke workup,  aspirin and suggest neurology consult by in-house neurologist tomorrow.  Aspirin ordered by me. Consulted Dr.Shah the hospitalist will arrange for overnight stay.  I counciled pt for 5 minute on smoking cessation Final Clinical Impressions(s) / ED Diagnoses  Dx #1 dysarthria #2 weakness #3 numbness #4 bad headache #5 tobacco abuse Final diagnoses:  None    ED Discharge Orders    None       Doug SouJacubowitz, Caileen Veracruz, MD 09/12/17 [id number]

## 2017-09-12 NOTE — ED Notes (Signed)
Hospitalist, Dr Sherryll BurgerShah at bedside

## 2017-09-12 NOTE — ED Notes (Signed)
Dr Shela CommonsJ has seen and informed she will be admitted

## 2017-09-12 NOTE — ED Notes (Signed)
To radiology

## 2017-09-12 NOTE — ED Notes (Signed)
Report to Brandy RN

## 2017-09-12 NOTE — ED Notes (Signed)
Pt reports awakened this am around 0800 with L sided weakness as well as not feeling right, and dizzy She reports a hx of migraines, but feels that this event is different  She is a cigarette smoker of 1 PPD for many years  She is alert, conversant answers questions appropriately and moves ad lib undressing and getting into the stretcher without assistance

## 2017-09-12 NOTE — H&P (Addendum)
History and Physical    Dominique Harrington ZOX:[id number] DOB: 1971/05/28 DOA: 09/12/2017  PCP: Jacquelin Hawking, PA-C   Patient coming from: Home  Chief Complaint: L hemiparesis and L facial numbness  HPI: Dominique Harrington is a 46 y.o. female with medical history significant for anxiety, hypertension, dyslipidemia, obesity, tobacco abuse, and migraine headaches who presents to the emergency department at the urging of her husband on account of left-sided facial numbness with drooling and slurred speech along with significant weakness to her left arm and left leg.  This was noted upon waking up this morning and was also associated with a left-sided headache.  This is not typical for her usual migraine headaches which she can experience up to 3 times per week.  Her typical migraines have visual changes associated with them.  She tried to treat herself with Maxalt which she will take for her migraines with no relief noted.  There appears to be no alleviating or aggravating factors.  She denies any visual changes, nausea, vomiting, dizziness, or lightheadedness.   ED Course: Vital signs are stable.  EKG with normal sinus rhythm and no changes.  CT head with no acute findings.  Laboratory data is otherwise unremarkable.  Telemetry neurology was consulted from the ED and recommendations were to consult neurology here and perform further stroke workup to include MRI/MRA as well as echocardiogram.  Patient continues to have trouble with her speech and persistent weakness to her left arm and left lower extremity on examination.  Review of Systems: As per HPI otherwise 10 point review of systems negative.   Past Medical History:  Diagnosis Date  . Anxiety   . Dyspnea on exertion 2013   Echo, EF =>55%  . Hypertension   . Migraines   . Tachycardia     Past Surgical History:  Procedure Laterality Date  . BACK SURGERY    . COSMETIC SURGERY    . HERNIA REPAIR    . TUBAL LIGATION  2004     reports  that she has been smoking cigarettes.  She has a 13.00 pack-year smoking history. she has never used smokeless tobacco. She reports that she does not drink alcohol or use drugs.  Allergies  Allergen Reactions  . Sulfa Antibiotics Nausea And Vomiting    Family History  Problem Relation Age of Onset  . Heart disease Father     Prior to Admission medications   Medication Sig Start Date End Date Taking? Authorizing Provider  atorvastatin (LIPITOR) 20 MG tablet Take 1 tablet (20 mg total) by mouth daily. 01/05/17  Yes Jacquelin Hawking, PA-C  hydrOXYzine (VISTARIL) 50 MG capsule Take 1 capsule (50 mg total) by mouth at bedtime as needed. 09/01/17  Yes Jacquelin Hawking, PA-C  lisinopril-hydrochlorothiazide (PRINZIDE,ZESTORETIC) 20-12.5 MG tablet TAKE 1 Tablet BY MOUTH ONCE DAILY 06/23/17  Yes McElroy, Shannon, PA-C  MAXALT-MLT 10 MG disintegrating tablet TAKE 1 TABLET BY MOUTH AT ONSET OF HEADACHE, MAY REPEAT IN 2 HOURS. MAX 2 TABLETS IN 24 HOURS. 06/23/17  Yes Jacquelin Hawking, PA-C  metoprolol tartrate (LOPRESSOR) 100 MG tablet TAKE 1 Tablet  BY MOUTH TWICE DAILY 06/23/17  Yes Jacquelin Hawking, PA-C  Omega-3 Fatty Acids (FISH OIL PO) Take 2,000 mg by mouth 2 (two) times daily.    Yes [provider]  promethazine (PHENERGAN) 25 MG tablet TAKE 1/2 TO 1 (ONE-HALF TO ONE) TABLET BY MOUTH EVERY 8 HOURS AS NEEDED FOR NAUSEA AND VOMITING 04/23/17  Yes Jacquelin Hawking, PA-C    Physical  Exam: Vitals:   09/12/17 1846 09/12/17 1900 09/12/17 1930 09/12/17 2000  BP: 137/79 111/65 123/79 95/72  Pulse: 64 (!) 56 (!) 57 (!) 58  Resp: 17 13 15 16   Temp:      TempSrc:      SpO2: 95% 97% 97% 95%  Weight:      Height:        Constitutional: NAD, calm, comfortable Vitals:   09/12/17 1846 09/12/17 1900 09/12/17 1930 09/12/17 2000  BP: 137/79 111/65 123/79 95/72  Pulse: 64 (!) 56 (!) 57 (!) 58  Resp: 17 13 15 16   Temp:      TempSrc:      SpO2: 95% 97% 97% 95%  Weight:      Height:        Eyes: lids and conjunctivae normal ENMT: Mucous membranes are moist.  Neck: normal, supple Respiratory: clear to auscultation bilaterally. Normal respiratory effort. No accessory muscle use.  Cardiovascular: Regular rate and rhythm, no murmurs. No extremity edema. Abdomen: no tenderness, no distention. Bowel sounds positive.  Musculoskeletal: Left upper and lower extremity weakness 4/5, with 5/5 on right. No sensory deficit noted. Skin: no rashes, lesions, ulcers.  Psychiatric: Normal judgment and insight. Alert and oriented x 3. Normal mood.   Labs on Admission: I have personally reviewed following labs and imaging studies  CBC: Recent Labs  Lab 09/12/17 1606 09/12/17 1716  WBC 6.5  --   NEUTROABS 3.0  --   HGB 13.1 12.9  HCT 40.6 38.0  MCV 96.2  --   PLT 218  --    Basic Metabolic Panel: Recent Labs  Lab 09/12/17 1606 09/12/17 1716  NA 138 140  K 4.2 4.4  CL 105 104  CO2 26  --   GLUCOSE 109* 102*  BUN 10 9  CREATININE 0.87 0.90  CALCIUM 9.6  --    GFR: Estimated Creatinine Clearance: 103.5 mL/min (by C-G formula based on SCr of 0.9 mg/dL). Liver Function Tests: Recent Labs  Lab 09/12/17 1606  AST 20  ALT 20  ALKPHOS 119  BILITOT 0.4  PROT 6.9  ALBUMIN 3.9   No results for input(s): LIPASE, AMYLASE in the last 168 hours. No results for input(s): AMMONIA in the last 168 hours. Coagulation Profile: Recent Labs  Lab 09/12/17 1606  INR 0.95   Cardiac Enzymes: No results for input(s): CKTOTAL, CKMB, CKMBINDEX, TROPONINI in the last 168 hours. BNP (last 3 results) No results for input(s): PROBNP in the last 8760 hours. HbA1C: No results for input(s): HGBA1C in the last 72 hours. CBG: Recent Labs  Lab 09/12/17 1656  GLUCAP 101*   Lipid Profile: No results for input(s): CHOL, HDL, LDLCALC, TRIG, CHOLHDL, LDLDIRECT in the last 72 hours. Thyroid Function Tests: No results for input(s): TSH, T4TOTAL, FREET4, T3FREE, THYROIDAB in the last 72  hours. Anemia Panel: No results for input(s): VITAMINB12, FOLATE, FERRITIN, TIBC, IRON, RETICCTPCT in the last 72 hours. Urine analysis: No results found for: COLORURINE, APPEARANCEUR, LABSPEC, PHURINE, GLUCOSEU, HGBUR, BILIRUBINUR, KETONESUR, PROTEINUR, UROBILINOGEN, NITRITE, LEUKOCYTESUR  Radiological Exams on Admission: Dg Chest 2 View  Result Date: 09/12/2017 CLINICAL DATA:  TIA EXAM: CHEST  2 VIEW COMPARISON:  10/19/2016 FINDINGS: Cardiomegaly. No confluent airspace opacities or effusions. No acute bony abnormality. IMPRESSION: Cardiomegaly.  No active disease. Electronically Signed   By: Charlett NoseKevin  Dover M.D.   On: 09/12/2017 20:20   Ct Head Wo Contrast  Result Date: 09/12/2017 CLINICAL DATA:  Slurred speech, left-sided facial numbness EXAM: CT HEAD  WITHOUT CONTRAST TECHNIQUE: Contiguous axial images were obtained from the base of the skull through the vertex without intravenous contrast. COMPARISON:  03/06/2011 FINDINGS: Brain: No acute intracranial abnormality. Specifically, no hemorrhage, hydrocephalus, mass lesion, acute infarction, or significant intracranial injury. Vascular: No hyperdense vessel or unexpected calcification. Skull: No acute calvarial abnormality. Sinuses/Orbits: Visualized paranasal sinuses and mastoids clear. Orbital soft tissues unremarkable. Other: None IMPRESSION: Normal study. Electronically Signed   By: Charlett NoseKevin  Dover M.D.   On: 09/12/2017 16:40    EKG: Independently reviewed. NSR.  Assessment/Plan Principal Problem:   TIA (transient ischemic attack) Active Problems:   Essential hypertension, benign   Hyperlipidemia   Cigarette nicotine dependence, uncomplicated   Headache, migraine   Obesity, unspecified    Neurological sequelae suspicious for TIA versus possible CVA (ABCD2=4) Consultation to neurology for further evaluation MRI/MRA of head Telemetry monitoring Carotid ultrasound bilateral 2D echocardiogram Permissive hypertension-hold blood pressure  medications unless systolic blood pressure greater than 220mmHG over next 24 hours Neurological checks N.p.o. until speech evaluation Aspirin 81 mg daily Fasting lipid panel Hemoglobin A1c Atorvastatin 80 mg daily PT/OT eval  Tobacco abuse Nicotine patch  Headache with history of migraines Toradol as needed for relief   DVT prophylaxis: Lovenox Code Status: Full Family Communication: Husband at bedside Disposition Plan:TBD after evaluation Consults called:Neurology Admission status: Obs, tele   Mert Dietrick Hoover BrunetteD Zacariah Belue DO [facility] Pager [phone]  If 7PM-7AM, please contact night-coverage [email or web address] Password [email or web address]  09/12/2017, 9:01 PM

## 2017-09-12 NOTE — ED Triage Notes (Signed)
Per patient woke this morning at 8:30 with slurred speech, numbness to left side of face, and dizziness. Patient states "fell like I can't keep my balance." Per husband patient has had some confusion as well. Patient last known well 2am.

## 2017-09-13 ENCOUNTER — Observation Stay (HOSPITAL_COMMUNITY): Payer: Medicaid Other

## 2017-09-13 ENCOUNTER — Observation Stay (HOSPITAL_BASED_OUTPATIENT_CLINIC_OR_DEPARTMENT_OTHER): Payer: Medicaid Other

## 2017-09-13 DIAGNOSIS — G43009 Migraine without aura, not intractable, without status migrainosus: Secondary | ICD-10-CM

## 2017-09-13 DIAGNOSIS — E785 Hyperlipidemia, unspecified: Secondary | ICD-10-CM

## 2017-09-13 DIAGNOSIS — I10 Essential (primary) hypertension: Secondary | ICD-10-CM

## 2017-09-13 DIAGNOSIS — F17210 Nicotine dependence, cigarettes, uncomplicated: Secondary | ICD-10-CM

## 2017-09-13 LAB — URINALYSIS, ROUTINE W REFLEX MICROSCOPIC
Bilirubin Urine: NEGATIVE
GLUCOSE, UA: NEGATIVE mg/dL
HGB URINE DIPSTICK: NEGATIVE
Ketones, ur: NEGATIVE mg/dL
NITRITE: NEGATIVE
PH: 5 (ref 5.0–8.0)
Protein, ur: 30 mg/dL — AB
SPECIFIC GRAVITY, URINE: 1.027 (ref 1.005–1.030)

## 2017-09-13 LAB — ECHOCARDIOGRAM COMPLETE
HEIGHTINCHES: 67 in
Weight: 4529.13 oz

## 2017-09-13 LAB — LIPID PANEL
CHOLESTEROL: 148 mg/dL (ref 0–200)
HDL: 30 mg/dL — ABNORMAL LOW (ref >40)
LDL Cholesterol: 48 mg/dL (ref 0–99)
Total CHOL/HDL Ratio: 4.9 RATIO
Triglycerides: 348 mg/dL — ABNORMAL HIGH (ref <150)
VLDL: 70 mg/dL — AB (ref 0–40)

## 2017-09-13 LAB — RAPID URINE DRUG SCREEN, HOSP PERFORMED
AMPHETAMINES: NOT DETECTED
BARBITURATES: NOT DETECTED
Benzodiazepines: POSITIVE — AB
Cocaine: NOT DETECTED
OPIATES: NOT DETECTED
TETRAHYDROCANNABINOL: NOT DETECTED

## 2017-09-13 LAB — TSH: TSH: 1.53 u[IU]/mL (ref 0.350–4.500)

## 2017-09-13 NOTE — Consult Note (Signed)
[place] A. Merlene Laughter, MD     [email or web address]          Dominique Harrington is an 46 y.o. female.   ASSESSMENT/PLAN: 1. Unexplained neurological symptoms consistent of altered mental status and left-sided focal deficit:  The differential diagnosis includes complex partial seizure, ischemic stroke not seen on MRI and migraine with aura/complicated migraine.  An EEG will be obtained.  Aspirin 162 mg is recommended.  Blood pressure control is also recommended.  Additional labs including homocystine level and RPR will be obtained.  2. Chronic daily migraine headaches:  3. Obesity and snoring with the possible obstructive sleep apnea syndrome:  Consider diagnostic polysomnography on discharge.  This is a 46 year old right-handed white female who developed the acute onset of confusion, dysarthria and left-sided numbness and weakness yesterday.  She apparently woke up this way and was noticed by her husband.  He reports that she was quite confused and talking on her head.  The patient does not remember most of what happened yesterday until she came to the emergency room.  Apparently, she had significant weakness involving the left side associated with numbness.  She has had improvement in her speech and left-sided symptoms today.  She does report having mild associated headaches in the frontal and occipital region.  She has a long-standing history over 25 years of episodic headaches and has been diagnosed with migraine headaches.  She typically takes Maxalt and an antiemetic medication for her symptoms.  She has sustained in a dark room remain quiet when these headaches are full-blown.  She does not report having shortness of breath or chest pain with her current symptoms.  The review of systems is otherwise negative.  GENERAL:  This is a very pleasant obese female who is in no acute distress.  HEENT:  The neck size is large approximately 17 in.  Neck is supple.  ABDOMEN:  Soft  EXTREMITIES: No edema   BACK: Normal alignment.  SKIN: Normal by inspection.    MENTAL STATUS: Alert and oriented - including orientation to month and her age. Speech, language and cognition are generally intact. Judgment and insight normal.   CRANIAL NERVES: Pupils are equal, round and reactive to light and accommodation; extraocular movements are full, there is no significant nystagmus; upper and lower facial muscles are normal in strength and symmetric, there is no flattening of the nasolabial folds; tongue is midline; uvula is midline; shoulder elevation is normal.  MOTOR:  Left deltoid is 4+/five.  Triceps and hand grip is five.  Bulk and tone are normal.  The right upper extremity shows normal tone, bulk and strength.  There is no drift of the upper extremities.  Left hip flexion is weak at four/five.  Dorsiflexion is five.  There is a mild drift of the left lower extremity.  The right leg is normal.  There is no drift of the right leg.    COORDINATION: Left finger to nose is normal, right finger to nose is normal, No rest tremor; no intention tremor; no postural tremor; no bradykinesia.  REFLEXES: Deep tendon reflexes are symmetrical and normal. Plantar responses are flexor bilaterally.   SENSATION: Normal to light touch and temperature.  She does not extinguish to double simultaneous stimulation.    NIH stroke scale one.     Blood pressure (!) 133/98, pulse 70, temperature 98.4 F (36.9 C), temperature source Oral, resp. rate 19, height _0  (1.702 m), weight 283 lb 1.1 oz (128.4 kg), last menstrual period  12/19/2014, SpO2 100 %.  Past Medical History:  Diagnosis Date  . Anxiety   . Dyspnea on exertion 2013   Echo, EF =>55%  . Hypertension   . Migraines   . Tachycardia     Past Surgical History:  Procedure Laterality Date  . BACK SURGERY    . COSMETIC SURGERY    . HERNIA REPAIR    . TUBAL LIGATION  2004    Family History  Problem Relation Age of Onset  .  Heart disease Father     Social History:  reports that she has been smoking cigarettes.  She has a 13.00 pack-year smoking history. she has never used smokeless tobacco. She reports that she does not drink alcohol or use drugs.  Allergies:  Allergies  Allergen Reactions  . Sulfa Antibiotics Nausea And Vomiting    Medications: Prior to Admission medications   Medication Sig Start Date End Date Taking? Authorizing Provider  atorvastatin (LIPITOR) 20 MG tablet Take 1 tablet (20 mg total) by mouth daily. 01/05/17  Yes Soyla Dryer, PA-C  hydrOXYzine (VISTARIL) 50 MG capsule Take 1 capsule (50 mg total) by mouth at bedtime as needed. 09/01/17  Yes Soyla Dryer, PA-C  lisinopril-hydrochlorothiazide (PRINZIDE,ZESTORETIC) 20-12.5 MG tablet TAKE 1 Tablet BY MOUTH ONCE DAILY 06/23/17  Yes McElroy, Shannon, PA-C  MAXALT-MLT 10 MG disintegrating tablet TAKE 1 TABLET BY MOUTH AT ONSET OF HEADACHE, MAY REPEAT IN 2 HOURS. MAX 2 TABLETS IN 24 HOURS. 06/23/17  Yes Soyla Dryer, PA-C  metoprolol tartrate (LOPRESSOR) 100 MG tablet TAKE 1 Tablet  BY MOUTH TWICE DAILY 06/23/17  Yes Soyla Dryer, PA-C  Omega-3 Fatty Acids (FISH OIL PO) Take 2,000 mg by mouth 2 (two) times daily.    Yes [provider]  promethazine (PHENERGAN) 25 MG tablet TAKE 1/2 TO 1 (ONE-HALF TO ONE) TABLET BY MOUTH EVERY 8 HOURS AS NEEDED FOR NAUSEA AND VOMITING 04/23/17  Yes Soyla Dryer, PA-C    Scheduled Meds: . aspirin  81 mg Oral Daily  . atorvastatin  80 mg Oral q1800  . enoxaparin (LOVENOX) injection  40 mg Subcutaneous Q24H  . nicotine  14 mg Transdermal Daily  . omega-3 acid ethyl esters  1 g Oral BID   Continuous Infusions: PRN Meds:.acetaminophen **OR** acetaminophen (TYLENOL) oral liquid 160 mg/5 mL **OR** acetaminophen, ketorolac, senna-docusate     Results for orders placed or performed during the hospital encounter of 09/12/17 (from the past 48 hour(s))  Protime-INR     Status: None    Collection Time: 09/12/17  4:06 PM  Result Value Ref Range   Prothrombin Time 12.6 11.4 - 15.2 seconds   INR 0.95   APTT     Status: None   Collection Time: 09/12/17  4:06 PM  Result Value Ref Range   aPTT 30 24 - 36 seconds  CBC     Status: None   Collection Time: 09/12/17  4:06 PM  Result Value Ref Range   WBC 6.5 4.0 - 10.5 K/uL   RBC 4.22 3.87 - 5.11 MIL/uL   Hemoglobin 13.1 12.0 - 15.0 g/dL   HCT 40.6 36.0 - 46.0 %   MCV 96.2 78.0 - 100.0 fL   MCH 31.0 26.0 - 34.0 pg   MCHC 32.3 30.0 - 36.0 g/dL   RDW 13.5 11.5 - 15.5 %   Platelets 218 150 - 400 K/uL  Differential     Status: None   Collection Time: 09/12/17  4:06 PM  Result Value Ref Range  Neutrophils Relative % 47 %   Neutro Abs 3.0 1.7 - 7.7 K/uL   Lymphocytes Relative 43 %   Lymphs Abs 2.8 0.7 - 4.0 K/uL   Monocytes Relative 5 %   Monocytes Absolute 0.3 0.1 - 1.0 K/uL   Eosinophils Relative 4 %   Eosinophils Absolute 0.3 0.0 - 0.7 K/uL   Basophils Relative 1 %   Basophils Absolute 0.0 0.0 - 0.1 K/uL  Comprehensive metabolic panel     Status: Abnormal   Collection Time: 09/12/17  4:06 PM  Result Value Ref Range   Sodium 138 135 - 145 mmol/L   Potassium 4.2 3.5 - 5.1 mmol/L   Chloride 105 101 - 111 mmol/L   CO2 26 22 - 32 mmol/L   Glucose, Bld 109 (H) 65 - 99 mg/dL   BUN 10 6 - 20 mg/dL   Creatinine, Ser 0.87 0.44 - 1.00 mg/dL   Calcium 9.6 8.9 - 10.3 mg/dL   Total Protein 6.9 6.5 - 8.1 g/dL   Albumin 3.9 3.5 - 5.0 g/dL   AST 20 15 - 41 U/L   ALT 20 14 - 54 U/L   Alkaline Phosphatase 119 38 - 126 U/L   Total Bilirubin 0.4 0.3 - 1.2 mg/dL   GFR calc non Af Amer >60 >60 mL/min   GFR calc Af Amer >60 >60 mL/min    Comment: (NOTE) The eGFR has been calculated using the CKD EPI equation. This calculation has not been validated in all clinical situations. eGFR's persistently <60 mL/min signify possible Chronic Kidney Disease.    Anion gap 7 5 - 15  Ethanol     Status: None   Collection Time: 09/12/17   4:54 PM  Result Value Ref Range   Alcohol, Ethyl (B) <10 <10 mg/dL    Comment:        LOWEST DETECTABLE LIMIT FOR SERUM ALCOHOL IS 10 mg/dL FOR MEDICAL PURPOSES ONLY   CBG monitoring, ED     Status: Abnormal   Collection Time: 09/12/17  4:56 PM  Result Value Ref Range   Glucose-Capillary 101 (H) 65 - 99 mg/dL  I-stat troponin, ED     Status: None   Collection Time: 09/12/17  5:14 PM  Result Value Ref Range   Troponin i, poc 0.00 0.00 - 0.08 ng/mL   Comment 3            Comment: Due to the release kinetics of cTnI, a negative result within the first hours of the onset of symptoms does not rule out myocardial infarction with certainty. If myocardial infarction is still suspected, repeat the test at appropriate intervals.   I-Stat Chem 8, ED     Status: Abnormal   Collection Time: 09/12/17  5:16 PM  Result Value Ref Range   Sodium 140 135 - 145 mmol/L   Potassium 4.4 3.5 - 5.1 mmol/L   Chloride 104 101 - 111 mmol/L   BUN 9 6 - 20 mg/dL   Creatinine, Ser 0.90 0.44 - 1.00 mg/dL   Glucose, Bld 102 (H) 65 - 99 mg/dL   Calcium, Ion 1.22 1.15 - 1.40 mmol/L   TCO2 28 22 - 32 mmol/L   Hemoglobin 12.9 12.0 - 15.0 g/dL   HCT 38.0 36.0 - 46.0 %  Urine rapid drug screen (hosp performed)     Status: Abnormal   Collection Time: 09/13/17  4:30 AM  Result Value Ref Range   Opiates NONE DETECTED NONE DETECTED   Cocaine NONE DETECTED NONE  DETECTED   Benzodiazepines POSITIVE (A) NONE DETECTED   Amphetamines NONE DETECTED NONE DETECTED   Tetrahydrocannabinol NONE DETECTED NONE DETECTED   Barbiturates NONE DETECTED NONE DETECTED    Comment:        DRUG SCREEN FOR MEDICAL PURPOSES ONLY.  IF CONFIRMATION IS NEEDED FOR ANY PURPOSE, NOTIFY LAB WITHIN 5 DAYS.        LOWEST DETECTABLE LIMITS FOR URINE DRUG SCREEN Drug Class       Cutoff (ng/mL) Amphetamine      1000 Barbiturate      200 Benzodiazepine   160 Tricyclics       737 Opiates          300 Cocaine          300 THC               50   Urinalysis, Routine w reflex microscopic     Status: Abnormal   Collection Time: 09/13/17  4:30 AM  Result Value Ref Range   Color, Urine YELLOW YELLOW   APPearance HAZY (A) CLEAR   Specific Gravity, Urine 1.027 1.005 - 1.030   pH 5.0 5.0 - 8.0   Glucose, UA NEGATIVE NEGATIVE mg/dL   Hgb urine dipstick NEGATIVE NEGATIVE   Bilirubin Urine NEGATIVE NEGATIVE   Ketones, ur NEGATIVE NEGATIVE mg/dL   Protein, ur 30 (A) NEGATIVE mg/dL   Nitrite NEGATIVE NEGATIVE   Leukocytes, UA SMALL (A) NEGATIVE   RBC / HPF 0-5 0 - 5 RBC/hpf   WBC, UA 6-30 0 - 5 WBC/hpf   Bacteria, UA RARE (A) NONE SEEN   Squamous Epithelial / LPF 6-30 (A) NONE SEEN   Mucus PRESENT    Hyaline Casts, UA PRESENT   Lipid panel     Status: Abnormal   Collection Time: 09/13/17  4:37 AM  Result Value Ref Range   Cholesterol 148 0 - 200 mg/dL   Triglycerides 348 (H) <150 mg/dL   HDL 30 (L) >40 mg/dL   Total CHOL/HDL Ratio 4.9 RATIO   VLDL 70 (H) 0 - 40 mg/dL   LDL Cholesterol 48 0 - 99 mg/dL    Comment:        Total Cholesterol/HDL:CHD Risk Coronary Heart Disease Risk Table                     Men   Women  1/2 Average Risk   3.4   3.3  Average Risk       5.0   4.4  2 X Average Risk   9.6   7.1  3 X Average Risk  23.4   11.0        Use the calculated Patient Ratio above and the CHD Risk Table to determine the patient's CHD Risk.        ATP III CLASSIFICATION (LDL):  <100     mg/dL   Optimal  100-129  mg/dL   Near or Above                    Optimal  130-159  mg/dL   Borderline  160-189  mg/dL   High  >190     mg/dL   Very High     Studies/Results:   CAROTID DOPPLERS IMPRESSION: 1. Mild (1-49%) stenosis proximal right internal carotid artery secondary to trace smooth heterogeneous atherosclerotic plaque. 2. Mild (1-49%) stenosis proximal left internal carotid artery secondary to trace smooth heterogeneous atherosclerotic plaque. 3. The vertebral arteries are patent  with normal antegrade       FINDINGS: MRI HEAD FINDINGS   Brain: No acute infarction, hemorrhage, hydrocephalus, extra-axial collection or mass lesion. Normal for age cerebral volume. No significant white matter disease.   Vascular: Flow voids are maintained throughout the carotid, basilar, and vertebral arteries. There are no areas of chronic hemorrhage.   Skull and upper cervical spine: Unremarkable visualized calvarium, skullbase, and cervical vertebrae. Pituitary, pineal, cerebellar tonsils unremarkable. No upper cervical cord lesions.   Sinuses/Orbits: No orbital masses or proptosis. Globes appear symmetric. Sinuses appear well aerated, without evidence for air-fluid level.   Other: None.   MRA HEAD FINDINGS   The internal carotid arteries are widely patent. The basilar artery is widely patent with vertebrals both contributing, RIGHT slightly larger. There is no proximal stenosis of the anterior, middle, or posterior cerebral arteries. No saccular aneurysm.      IMPRESSION: No acute intracranial abnormality. No cause is seen for the reported symptoms.   Unremarkable MRA of the intracranial circulation.       TTE - Left ventricle: The cavity size was normal. Wall thickness was   increased in a pattern of mild LVH. Systolic function was normal.   The estimated ejection fraction was in the range of 55% to 60%.   Wall motion was normal; there were no regional wall motion   abnormalities. Left ventricular diastolic function parameters   were normal. - Left atrium: The atrium was mildly dilated. - Atrial septum: No defect or patent foramen ovale was identified.      The brain MRI scan is reviewed in person.  There is a tiny faint increased signal involving the left frontal region near to the frontal horn of the lateral ventricle.  The ADC scan is unremarkable however.  No white matter lesions are appreciated.  No hemorrhage and no encephalomalacia seen on T1.  The MRA is  normal.    Emon Lance A. Merlene Laughter, M.D.  Diplomate, Tax adviser of Psychiatry and Neurology ( Neurology). 09/13/2017, 6:07 PM

## 2017-09-13 NOTE — Progress Notes (Addendum)
PROGRESS NOTE    Dominique Harrington  ZOX:[id number]  DOB: 1970-12-28  DOA: 09/12/2017 PCP: Jacquelin Hawking, PA-C   Brief Admission Hx: Dominique Harrington is a 46 y.o. female with medical history significant for anxiety, hypertension, dyslipidemia, obesity, tobacco abuse, and migraine headaches who presents to the emergency department at the urging of her husband on account of left-sided facial numbness with drooling and slurred speech along with significant weakness to her left arm and left leg.  This was noted upon waking up this morning and was also associated with a left-sided headache.  This is not typical for her usual migraine headaches which she can experience up to 3 times per week.  Her typical migraines have visual changes associated with them.  She tried to treat herself with Maxalt which she will take for her migraines with no relief noted.  There appears to be no alleviating or aggravating factors.  She denies any visual changes, nausea, vomiting, dizziness, or lightheadedness.  MDM/Assessment & Plan:   1. TIA - Pt is being admitted for further work up to rule out CVA.  MRI / MRA pending.  Aspirin ordered for antiplatelet therapy.  Continue telemetry monitoring.  Carotid ultrasound ordered.  2D echocardiogram ordered.  Allow permissive hypertension.  Fasting lipid panel and A1c pending.  PT OT evaluation pending.  Neurology consultation has been requested. 2. Chronic migraine headaches-Toradol seems to be helping with symptoms. 3. Chronic active nicotine dependence-patient counseled to avoid all tobacco products.  We did order a nicotine patch for her to use in the hospital for cravings.  The patient verbalized understanding. 4. Essential hypertension- allowing permissive hypertension in the setting of a possible TIA versus CVA. 5. Abnormal urinalysis - WBC seen on urinalysis but patient not having symptoms of UTI, will await culture results, no abx indicated at this time.   DVT  prophylaxis: Lovenox Code Status: Full Family Communication: Husband updated Disposition Plan: To be determined   Consultants:  Neurology  Subjective: The patient reports that she continues to have weakness in the left upper and lower extremities.  She does report improvement in the left lower extremity but is not back to baseline.  The patient also reports improvement in speech patterns.  Objective: Vitals:   09/13/17 0105 09/13/17 0305 09/13/17 0505 09/13/17 0705  BP: 109/68 121/67 132/73 (!) 143/86  Pulse: 72 67 64 76  Resp: 16 18 16 18   Temp: 98.5 F (36.9 C) 98.1 F (36.7 C) 97.7 F (36.5 C) 98.5 F (36.9 C)  TempSrc: Oral Oral Oral Oral  SpO2: 97% 96%  98%  Weight:      Height:        Intake/Output Summary (Last 24 hours) at 09/13/2017 1108 Last data filed at 09/13/2017 8119 Gross per 24 hour  Intake 595 ml  Output 250 ml  Net 345 ml   Filed Weights   09/12/17 1606 09/12/17 2105  Weight: 125.6 kg (277 lb) 128.4 kg (283 lb 1.1 oz)     REVIEW OF SYSTEMS  As per history otherwise all reviewed and reported negative  Exam:  General exam: Awake, alert, no distress, cooperative and pleasant Respiratory system: Clear. No increased work of breathing. Cardiovascular system: S1 & S2 heard, RRR. No JVD, murmurs, gallops, clicks or pedal edema. Gastrointestinal system: Abdomen is nondistended, soft and nontender. Normal bowel sounds heard. Central nervous system: Alert and oriented.  4/5 strength in the left upper and lower extremities.  No other deficits noted. Extremities: no CCE.  Data Reviewed: Basic Metabolic Panel: Recent Labs  Lab 09/12/17 1606 09/12/17 1716  NA 138 140  K 4.2 4.4  CL 105 104  CO2 26  --   GLUCOSE 109* 102*  BUN 10 9  CREATININE 0.87 0.90  CALCIUM 9.6  --    Liver Function Tests: Recent Labs  Lab 09/12/17 1606  AST 20  ALT 20  ALKPHOS 119  BILITOT 0.4  PROT 6.9  ALBUMIN 3.9   No results for input(s): LIPASE, AMYLASE in  the last 168 hours. No results for input(s): AMMONIA in the last 168 hours. CBC: Recent Labs  Lab 09/12/17 1606 09/12/17 1716  WBC 6.5  --   NEUTROABS 3.0  --   HGB 13.1 12.9  HCT 40.6 38.0  MCV 96.2  --   PLT 218  --    Cardiac Enzymes: No results for input(s): CKTOTAL, CKMB, CKMBINDEX, TROPONINI in the last 168 hours. CBG (last 3)  Recent Labs    09/12/17 1656  GLUCAP 101*   No results found for this or any previous visit (from the past 240 hour(s)).   Studies: Dg Chest 2 View  Result Date: 09/12/2017 CLINICAL DATA:  TIA EXAM: CHEST  2 VIEW COMPARISON:  10/19/2016 FINDINGS: Cardiomegaly. No confluent airspace opacities or effusions. No acute bony abnormality. IMPRESSION: Cardiomegaly.  No active disease. Electronically Signed   By: Charlett NoseKevin  Dover M.D.   On: 09/12/2017 20:20   Ct Head Wo Contrast  Result Date: 09/12/2017 CLINICAL DATA:  Slurred speech, left-sided facial numbness EXAM: CT HEAD WITHOUT CONTRAST TECHNIQUE: Contiguous axial images were obtained from the base of the skull through the vertex without intravenous contrast. COMPARISON:  03/06/2011 FINDINGS: Brain: No acute intracranial abnormality. Specifically, no hemorrhage, hydrocephalus, mass lesion, acute infarction, or significant intracranial injury. Vascular: No hyperdense vessel or unexpected calcification. Skull: No acute calvarial abnormality. Sinuses/Orbits: Visualized paranasal sinuses and mastoids clear. Orbital soft tissues unremarkable. Other: None IMPRESSION: Normal study. Electronically Signed   By: Charlett NoseKevin  Dover M.D.   On: 09/12/2017 16:40   Mr Brain Wo Contrast  Result Date: 09/13/2017 CLINICAL DATA:  Slurred speech, LEFT-sided numbness, and dizziness. This began 09/12/2017. EXAM: MRI HEAD WITHOUT CONTRAST MRA HEAD WITHOUT CONTRAST TECHNIQUE: Multiplanar, multiecho pulse sequences of the brain and surrounding structures were obtained without intravenous contrast. Angiographic images of the head were  obtained using MRA technique without contrast. COMPARISON:  CT head 09/12/2017. FINDINGS: MRI HEAD FINDINGS Brain: No acute infarction, hemorrhage, hydrocephalus, extra-axial collection or mass lesion. Normal for age cerebral volume. No significant white matter disease. Vascular: Flow voids are maintained throughout the carotid, basilar, and vertebral arteries. There are no areas of chronic hemorrhage. Skull and upper cervical spine: Unremarkable visualized calvarium, skullbase, and cervical vertebrae. Pituitary, pineal, cerebellar tonsils unremarkable. No upper cervical cord lesions. Sinuses/Orbits: No orbital masses or proptosis. Globes appear symmetric. Sinuses appear well aerated, without evidence for air-fluid level. Other: None. MRA HEAD FINDINGS The internal carotid arteries are widely patent. The basilar artery is widely patent with vertebrals both contributing, RIGHT slightly larger. There is no proximal stenosis of the anterior, middle, or posterior cerebral arteries. No saccular aneurysm. IMPRESSION: No acute intracranial abnormality. No cause is seen for the reported symptoms. Unremarkable MRA of the intracranial circulation. Electronically Signed   By: Elsie StainJohn T Curnes M.D.   On: 09/13/2017 10:09   Mr Maxine GlennMra Head/brain WUWo Cm  Result Date: 09/13/2017 CLINICAL DATA:  Slurred speech, LEFT-sided numbness, and dizziness. This began 09/12/2017. EXAM: MRI HEAD WITHOUT CONTRAST  MRA HEAD WITHOUT CONTRAST TECHNIQUE: Multiplanar, multiecho pulse sequences of the brain and surrounding structures were obtained without intravenous contrast. Angiographic images of the head were obtained using MRA technique without contrast. COMPARISON:  CT head 09/12/2017. FINDINGS: MRI HEAD FINDINGS Brain: No acute infarction, hemorrhage, hydrocephalus, extra-axial collection or mass lesion. Normal for age cerebral volume. No significant white matter disease. Vascular: Flow voids are maintained throughout the carotid, basilar, and  vertebral arteries. There are no areas of chronic hemorrhage. Skull and upper cervical spine: Unremarkable visualized calvarium, skullbase, and cervical vertebrae. Pituitary, pineal, cerebellar tonsils unremarkable. No upper cervical cord lesions. Sinuses/Orbits: No orbital masses or proptosis. Globes appear symmetric. Sinuses appear well aerated, without evidence for air-fluid level. Other: None. MRA HEAD FINDINGS The internal carotid arteries are widely patent. The basilar artery is widely patent with vertebrals both contributing, RIGHT slightly larger. There is no proximal stenosis of the anterior, middle, or posterior cerebral arteries. No saccular aneurysm. IMPRESSION: No acute intracranial abnormality. No cause is seen for the reported symptoms. Unremarkable MRA of the intracranial circulation. Electronically Signed   By: Elsie StainJohn T Curnes M.D.   On: 09/13/2017 10:09     Scheduled Meds: . aspirin  81 mg Oral Daily  . atorvastatin  80 mg Oral q1800  . enoxaparin (LOVENOX) injection  40 mg Subcutaneous Q24H  . nicotine  14 mg Transdermal Daily  . omega-3 acid ethyl esters  1 g Oral BID   Continuous Infusions:  Principal Problem:   TIA (transient ischemic attack) Active Problems:   Essential hypertension, benign   Hyperlipidemia   Cigarette nicotine dependence, uncomplicated   Headache, migraine   Obesity, unspecified   Time spent:   Standley Dakinslanford Sloane Palmer, MD, FAAFP [facility] Pager [phone]  If 7PM-7AM, please contact night-coverage [email or web address] Password [email or web address] 09/13/2017, 11:08 AM    LOS: 0 days

## 2017-09-13 NOTE — Care Management Note (Addendum)
Case Management Note  Patient Details  Name: Dominique Harrington MRN: [id number] Date of Birth: 1970-12-11  Subjective/Objective:                  Adm for TIA/CVA workup. From home with husband, ind with ADL's pta. No HH or DME pta. Recommended for OP PT and OT and cane.   Action/Plan: Patient agreeable to OP referral and cane. Patient does not have insurance. Kasandra Knudsen will be delivered by [facility] to room as charity. Also recommended for tub/shower seat, per Vaughan Basta patient will need to pick up at store in [place] and will tell patient.  Financial counselor has met with patient to determine Medicaid eligibility.   Expected Discharge Date:    09/14/2017              Expected Discharge Plan:  Home/Self Care  In-House Referral:     Discharge planning Services  CM Consult  Post Acute Care Choice:  Durable Medical Equipment Choice offered to:  Patient  DME Arranged:  Kasandra Knudsen, Shower seat DME Agency:  [place]:    [facility] Agency:  Other - See comment(OP PT and OT referral )  Status of Service:  Completed, signed off  If discussed at [place] of Stay Meetings, dates discussed:    Additional Comments:  Maybell Misenheimer, Chauncey Reading, RN 09/13/2017, 12:30 PM

## 2017-09-13 NOTE — Progress Notes (Signed)
SLP Cancellation Note  Patient Details Name: Dominique HasteCarrie P Gaunt MRN: [id number] DOB: 1971/08/28   Cancelled treatment:       Reason Eval/Treat Not Completed: SLP screened, no needs identified, will sign off; SLP screened Pt in room. Pt denies any changes in swallowing, speech, language, or cognition. MRI negative for acute changes. SLE will be deferred at this time. Reconsult if indicated. SLP will sign off.   Thank you,  Havery MorosDabney Porter, CCC-SLP [phone]  PORTER,DABNEY 09/13/2017, 4:34 PM

## 2017-09-13 NOTE — Evaluation (Signed)
Physical Therapy Evaluation Patient Details Name: Dominique Harrington Cartwright MRN: [id number] DOB: 1971/08/17 Today's Date: 09/13/2017   History of Present Illness  Dominique Harrington Koos is a 46 y.o. female with medical history significant for anxiety, hypertension, dyslipidemia, obesity, tobacco abuse, and migraine headaches who presents to the emergency department at the urging of her husband on account of left-sided facial numbness with drooling and slurred speech along with significant weakness to her left arm and left leg.  This was noted upon waking up this morning and was also associated with a left-sided headache.  This is not typical for her usual migraine headaches which she can experience up to 3 times per week.  Her typical migraines have visual changes associated with them.  She tried to treat herself with Maxalt which she will take for her migraines with no relief noted.  There appears to be no alleviating or aggravating factors.  She denies any visual changes, nausea, vomiting, dizziness, or lightheadedness. MRI pending    Clinical Impression  Patient demonstrates slow labored movement for transfers and gait with decreased step/stride length LLE, had 1 near loss of balance once fatigued and required use of SPC due to tendency to reach for nearby object for support.  Patient will benefit from continued physical therapy in hospital and recommended venue below to increase strength, balance, endurance for safe ADLs and gait.    Follow Up Recommendations Outpatient PT;Supervision for mobility/OOB    Equipment Recommendations  Cane    Recommendations for Other Services       Precautions / Restrictions Precautions Precautions: Fall Precaution Comments: LLE weakness Restrictions Weight Bearing Restrictions: No      Mobility  Bed Mobility Overal bed mobility: Modified Independent                Transfers Overall transfer level: Needs assistance Equipment used: Straight  cane;None Transfers: Sit to/from Stand;Stand Pivot Transfers Sit to Stand: Min guard Stand pivot transfers: Min guard       General transfer comment: slightly unsteady with tendency to reach for nearby objects for support  Ambulation/Gait Ambulation/Gait assistance: Min assist Ambulation Distance (Feet): 20 Feet Assistive device: Straight cane;None Gait Pattern/deviations: Decreased step length - left;Decreased stance time - left;Decreased stride length   Gait velocity interpretation: Below normal speed for age/gender General Gait Details: slightly unsteady slow cadence with tendency to reach for walls, siderail for support, had near loss of balance once fatigue and demonstrated mostly 3 point gait pattern using SPC  Stairs            Wheelchair Mobility    Modified Rankin (Stroke Patients Only)       Balance Overall balance assessment: Needs assistance Sitting-balance support: No upper extremity supported;Feet supported Sitting balance-Leahy Scale: Good     Standing balance support: No upper extremity supported;During functional activity Standing balance-Leahy Scale: Fair                               Pertinent Vitals/Pain Pain Assessment: No/denies pain Pain Score: 8  Pain Location: Head Pain Descriptors / Indicators: Headache Pain Intervention(s): Limited activity within patient's tolerance;Monitored during session    Home Living Family/patient expects to be discharged to:: Private residence Living Arrangements: Spouse/significant other;Children Available Help at Discharge: Family;Available PRN/intermittently Type of Home: House Home Access: Stairs to enter Entrance Stairs-Rails: None Entrance Stairs-Number of Steps: 1 Home Layout: One level Home Equipment: None      Prior Function  Level of Independence: Independent               Hand Dominance   Dominant Hand: Right    Extremity/Trunk Assessment   Upper Extremity  Assessment Upper Extremity Assessment: Defer to OT evaluation LUE Deficits / Details: strength 3/5 throughout, decreased grip and pinch strength    Lower Extremity Assessment Lower Extremity Assessment: Overall WFL for tasks assessed;LLE deficits/detail LLE Deficits / Details: grossly 3/5    Cervical / Trunk Assessment Cervical / Trunk Assessment: Normal  Communication   Communication: No difficulties  Cognition Arousal/Alertness: Awake/alert Behavior During Therapy: WFL for tasks assessed/performed Overall Cognitive Status: Within Functional Limits for tasks assessed                                        General Comments      Exercises     Assessment/Plan    PT Assessment Patient needs continued PT services  PT Problem List Decreased strength;Decreased activity tolerance;Decreased balance;Decreased mobility       PT Treatment Interventions Gait training;Functional mobility training;Therapeutic activities;Therapeutic exercise;Stair training;Patient/family education    PT Goals (Current goals can be found in the Care Plan section)  Acute Rehab PT Goals Patient Stated Goal: return home PT Goal Formulation: With patient Time For Goal Achievement: 09/16/17 Potential to Achieve Goals: Good    Frequency 7X/week   Barriers to discharge        Co-evaluation               AM-PAC PT "6 Clicks" Daily Activity  Outcome Measure Difficulty turning over in bed (including adjusting bedclothes, sheets and blankets)?: None Difficulty moving from lying on back to sitting on the side of the bed? : None Difficulty sitting down on and standing up from a chair with arms (e.g., wheelchair, bedside commode, etc,.)?: A Little Help needed moving to and from a bed to chair (including a wheelchair)?: A Little Help needed walking in hospital room?: A Little Help needed climbing 3-5 steps with a railing? : A Little 6 Click Score: 20    End of Session Equipment  Utilized During Treatment: Gait belt Activity Tolerance: Patient limited by fatigue Patient left: in chair;with call bell/phone within reach Nurse Communication: Mobility status PT Visit Diagnosis: Unsteadiness on feet (R26.81);Other abnormalities of gait and mobility (R26.89);Muscle weakness (generalized) (M62.81)    Time: [id number] PT Time Calculation (min) (ACUTE ONLY): 24 min   Charges:   PT Evaluation $PT Eval Low Complexity: 1 Low PT Treatments $Therapeutic Activity: 23-37 mins   PT G Codes:   PT G-Codes **NOT FOR INPATIENT CLASS** Functional Assessment Tool Used: AM-PAC 6 Clicks Basic Mobility Functional Limitation: Mobility: Walking and moving around Mobility: Walking and Moving Around Current Status ([id number](G8978): At least 20 percent but less than 40 percent impaired, limited or restricted Mobility: Walking and Moving Around Goal Status [phone](G8979): At least 20 percent but less than 40 percent impaired, limited or restricted Mobility: Walking and Moving Around Discharge Status [phone](G8980): At least 20 percent but less than 40 percent impaired, limited or restricted    9:50 AM, 09/13/17 Ocie BobJames Ferrell Claiborne, MPT Physical Therapist with [facility] 336 [phone] office [phone] mobile phone

## 2017-09-13 NOTE — Progress Notes (Signed)
Pt arrived back on the floor.  

## 2017-09-13 NOTE — Evaluation (Signed)
Occupational Therapy Evaluation Patient Details Name: Dominique Harrington Magnan MRN: [id number] DOB: Oct 25, 1970 Today's Date: 09/13/2017    History of Present Illness Dominique Harrington [place]benstreit is a 46 y.o. female with medical history significant for anxiety, hypertension, dyslipidemia, obesity, tobacco abuse, and migraine headaches who presents to the emergency department at the urging of her husband on account of left-sided facial numbness with drooling and slurred speech along with significant weakness to her left arm and left leg.  This was noted upon waking up this morning and was also associated with a left-sided headache.  This is not typical for her usual migraine headaches which she can experience up to 3 times per week.  Her typical migraines have visual changes associated with them.  She tried to treat herself with Maxalt which she will take for her migraines with no relief noted.  There appears to be no alleviating or aggravating factors.  She denies any visual changes, nausea, vomiting, dizziness, or lightheadedness. MRI pending   Clinical Impression   Pt received supine in bed, agreeable to OT evaluation. Pt demonstrates LUE weakness and decreased overall activity tolerance this am. Pt's strength is 3/5 throughout with significantly decreased grip strength. Sensation and coordination are intact. Pt very fatigued with functional mobility from bed to doorway, able to return to bed without a seated rest break. Pt's husband, daughters, and mother-in-law able to provide assistance at home on discharge. Recommend outpatient OT services on discharge to improve strength and activity tolerance needed for functional task completion. No further acute services required at this time.     Follow Up Recommendations  Outpatient OT;Supervision/Assistance - 24 hour    Equipment Recommendations  Tub/shower seat       Precautions / Restrictions Precautions Precautions: Fall Precaution Comments: LLE  weakness Restrictions Weight Bearing Restrictions: No      Mobility Bed Mobility Overal bed mobility: Modified Independent                Transfers Overall transfer level: Needs assistance Equipment used: None Transfers: Sit to/from Stand Sit to Stand: Min guard                  ADL either performed or assessed with clinical judgement   ADL Overall ADL's : Needs assistance/impaired                     Lower Body Dressing: Supervision/safety;Sitting/lateral leans               Functional mobility during ADLs: Min guard General ADL Comments: Pt completed ADLs with set-up and increased time while sitting, min guard and increased time in standing. Standing ADLs limited due to LLE weakness and fatigue limiting standing tolerance     Vision Baseline Vision/History: No visual deficits Patient Visual Report: No change from baseline Vision Assessment?: No apparent visual deficits            Pertinent Vitals/Pain Pain Assessment: 0-10 Pain Score: 8  Pain Location: Head Pain Descriptors / Indicators: Headache Pain Intervention(s): Limited activity within patient's tolerance;Monitored during session     Hand Dominance Right   Extremity/Trunk Assessment Upper Extremity Assessment Upper Extremity Assessment: LUE deficits/detail LUE Deficits / Details: strength 3/5 throughout, decreased grip and pinch strength   Lower Extremity Assessment Lower Extremity Assessment: Defer to PT evaluation   Cervical / Trunk Assessment Cervical / Trunk Assessment: Normal   Communication Communication Communication: No difficulties   Cognition Arousal/Alertness: Awake/alert Behavior During Therapy: WFL for tasks assessed/performed Overall  Cognitive Status: Within Functional Limits for tasks assessed                                                Home Living Family/patient expects to be discharged to:: Private residence Living  Arrangements: Spouse/significant other;Children Available Help at Discharge: Family;Available PRN/intermittently Type of Home: House Home Access: Stairs to enter [employer] CorporationEntrance Stairs-Number of Steps: 1 Entrance Stairs-Rails: None Home Layout: One level     Bathroom Shower/Tub: Chief Strategy OfficerTub/shower unit   Bathroom Toilet: Standard     Home Equipment: None          Prior Functioning/Environment Level of Independence: Independent                 OT Problem List: Decreased strength;Decreased activity tolerance;Impaired balance (sitting and/or standing);Impaired UE functional use       AM-PAC PT "6 Clicks" Daily Activity     Outcome Measure Help from another person eating meals?: None Help from another person taking care of personal grooming?: None Help from another person toileting, which includes using toliet, bedpan, or urinal?: None Help from another person bathing (including washing, rinsing, drying)?: A Little Help from another person to put on and taking off regular upper body clothing?: A Little Help from another person to put on and taking off regular lower body clothing?: A Little 6 Click Score: 21   End of Session Equipment Utilized During Treatment: Gait belt  Activity Tolerance: Patient tolerated treatment well Patient left: in bed;with call bell/phone within reach  OT Visit Diagnosis: Muscle weakness (generalized) (M62.81)                Time: [id number] OT Time Calculation (min): 24 min Charges:  OT General Charges $OT Visit: 1 Visit OT Evaluation $OT Eval Low Complexity: 1 Low G-Codes: OT G-codes **NOT FOR INPATIENT CLASS** Functional Assessment Tool Used: AM-PAC 6 Clicks Daily Activity Functional Limitation: Self care Self Care Current Status ([id number]): At least 20 percent but less than 40 percent impaired, limited or restricted Self Care Goal Status ([id number]([id number]): At least 20 percent but less than 40 percent impaired, limited or restricted Self Care Discharge Status  [phone]([id number]): At least 20 percent but less than 40 percent impaired, limited or restricted    Ezra SitesLeslie Teara Duerksen, OTR/L  [phone] 09/13/2017, 8:16 AM

## 2017-09-13 NOTE — Progress Notes (Signed)
*  PRELIMINARY RESULTS* Echocardiogram 2D Echocardiogram has been performed.  Jeryl Columbialliott, Zailyn Rowser 09/13/2017, 4:10 PM

## 2017-09-13 NOTE — Plan of Care (Signed)
  Acute Rehab PT Goals(only PT should resolve) Patient Will Transfer Sit To/From Stand 09/13/2017 0952 - Progressing by Ocie BobWatkins, [place] Hill[place]oss, PT Flowsheets Taken 09/13/2017 [phone]  Patient will transfer sit to/from stand with modified independence Pt Will Transfer Bed To Chair/Chair To Bed 09/13/2017 0952 - Progressing by Ocie BobWatkins, Sharmin Foulk, PT Flowsheets Taken 09/13/2017 0952  Pt will Transfer Bed to Chair/Chair to Bed with modified independence Pt Will Ambulate 09/13/2017 0952 - Progressing by Ocie BobWatkins, Malkia Nippert, PT Flowsheets Taken 09/13/2017 0952  Pt will Ambulate 75 feet;with cane;with supervision  9:52 AM, 09/13/17 Ocie BobJames Bellanie Matthew, MPT Physical Therapist [facility] 336 [phone] office [phone] mobile phone

## 2017-09-13 NOTE — Progress Notes (Signed)
Pt transported down for MRI and ultrasound via transport staff.

## 2017-09-14 ENCOUNTER — Other Ambulatory Visit (HOSPITAL_COMMUNITY): Payer: Self-pay

## 2017-09-14 DIAGNOSIS — R471 Dysarthria and anarthria: Secondary | ICD-10-CM

## 2017-09-14 DIAGNOSIS — G8194 Hemiplegia, unspecified affecting left nondominant side: Secondary | ICD-10-CM

## 2017-09-14 DIAGNOSIS — E782 Mixed hyperlipidemia: Secondary | ICD-10-CM

## 2017-09-14 DIAGNOSIS — F17200 Nicotine dependence, unspecified, uncomplicated: Secondary | ICD-10-CM

## 2017-09-14 DIAGNOSIS — R209 Unspecified disturbances of skin sensation: Secondary | ICD-10-CM

## 2017-09-14 LAB — HIV ANTIBODY (ROUTINE TESTING W REFLEX): HIV SCREEN 4TH GENERATION: NONREACTIVE

## 2017-09-14 LAB — VITAMIN B12: VITAMIN B 12: 204 pg/mL (ref 180–914)

## 2017-09-14 LAB — HEMOGLOBIN A1C
HEMOGLOBIN A1C: 5.9 % — AB (ref 4.8–5.6)
Mean Plasma Glucose: 123 mg/dL

## 2017-09-14 MED ORDER — ASPIRIN 81 MG PO CHEW: 81.0000 mg | CHEWABLE_TABLET | Freq: Every day | ORAL | Status: DC

## 2017-09-14 NOTE — Progress Notes (Signed)
Patient being d/c home with spouse. IV cath removed and intact. No c/o pain at this time or at site. Verbalizes understanding of instructions.

## 2017-09-14 NOTE — Discharge Summary (Signed)
Physician Discharge Summary  Dominique Harrington ZOX:[id number] DOB: 03-Apr-1971 DOA: 09/12/2017  PCP: Jacquelin Hawking, PA-C  Admit date: 09/12/2017 Discharge date: 09/14/2017  Admitted From: Home Disposition:  Home   Recommendations for Outpatient Follow-up:  1. Follow up with PCP in 1-2 weeks 2. Please obtain BMP/CBC in one week    Discharge Condition: Stable CODE STATUS: FULL Diet recommendation: Heart Healthy   Brief/Interim Summary: 46 year old female with a history of hypertension, hyperlipidemia, tobacco abuse, migraine headache, and depression presented with onset of dysarthria, left-sided numbness and weakness when she woke up on the morning of September 12, 2017.  As a result, the patient presented to the emergency department for further evaluation.  She states that she has had long-standing history of headache for the past 25 years which was previously diagnosed as migraine headaches.  Since admission, the patient stated that her speech and left-sided weakness have resolved.  The patient was admitted and underwent a stroke workup.  MRI of the brain was negative for any acute findings.  MRA of the brain was negative for any hemodynamically significant stenosis.  Carotid duplex was negative for hemodynamically significant stenosis.  Echocardiogram did not show any intracardiac thrombi.  The patient was seen by neurology who did not feel there was a unifying diagnosis for the patient's neurological symptoms.  Neurology recommended EEG; however, the patient did not want to stay in the hospital for any longer period of time to wait for the procedure or the results.  The patient wanted to follow-up and have this performed in the outpatient setting.  In addition, the patient expressed that she wanted a second opinion from a different neurologist. Referral was made to [facility] neurology for EEG and consultation  Discharge Diagnoses:   Dysarthria/sensory disturbance/left  hemiparesis -Completely resolved -Appreciate Neurology Consult--unclear explanation for symptoms -PT evaluation--out patient PT -Speech therapy eval--regular -CT brain--neg -MRI brain--neg -MRA brain--neg for hemodynamically significant stenosis -Carotid Duplex--negative for hemodynamically significant stenosis -Echo--EF 55-60%, no WMA, no intracardiac thrombi -LDL--48 -HbA1C--5.9 -Antiplatelet--ASA 81 mg -instructed patient not to drive until cleared by neurology  Migraine headaches -Controlled with Toradol during hospitalization -Outpatient follow-up with neurology  Essential hypertension -patient's BP well controlled off metoprolol, lisinopril and HCTZ -will not restart after d/c -follow up with PCP for BP check  Pyuria -pt asymptomatic -no abx indicated  Tobacco abuse -cessation discussed    Discharge Instructions  Discharge Instructions    Ambulatory referral to Neurology   Complete by:  As directed    Refer to [facility] Please perform ambulatory EEG.  Also needs consultation with Dr. Everlena Cooper   Ambulatory referral to Occupational Therapy   Complete by:  As directed    Ambulatory referral to Physical Therapy   Complete by:  As directed    Diet - low sodium heart healthy   Complete by:  As directed    Increase activity slowly   Complete by:  As directed      Allergies as of 09/14/2017      Reactions   Sulfa Antibiotics Nausea And Vomiting      Medication List    STOP taking these medications   lisinopril-hydrochlorothiazide 20-12.5 MG tablet Commonly known as:  PRINZIDE,ZESTORETIC   metoprolol tartrate 100 MG tablet Commonly known as:  LOPRESSOR     TAKE these medications   aspirin 81 MG chewable tablet Chew 1 tablet (81 mg total) by mouth daily.   atorvastatin 20 MG tablet Commonly known as:  LIPITOR Take 1 tablet (20 mg  total) by mouth daily.   FISH OIL PO Take 2,000 mg by mouth 2 (two) times daily.   hydrOXYzine 50 MG  capsule Commonly known as:  VISTARIL Take 1 capsule (50 mg total) by mouth at bedtime as needed.   MAXALT-MLT 10 MG disintegrating tablet Generic drug:  rizatriptan TAKE 1 TABLET BY MOUTH AT ONSET OF HEADACHE, MAY REPEAT IN 2 HOURS. MAX 2 TABLETS IN 24 HOURS.   promethazine 25 MG tablet Commonly known as:  PHENERGAN TAKE 1/2 TO 1 (ONE-HALF TO ONE) TABLET BY MOUTH EVERY 8 HOURS AS NEEDED FOR NAUSEA AND VOMITING       Allergies  Allergen Reactions  . Sulfa Antibiotics Nausea And Vomiting    Consultations:  neurology   Procedures/Studies: Dg Chest 2 View  Result Date: 09/12/2017 CLINICAL DATA:  TIA EXAM: CHEST  2 VIEW COMPARISON:  10/19/2016 FINDINGS: Cardiomegaly. No confluent airspace opacities or effusions. No acute bony abnormality. IMPRESSION: Cardiomegaly.  No active disease. Electronically Signed   By: Charlett NoseKevin  Dover M.D.   On: 09/12/2017 20:20   Ct Head Wo Contrast  Result Date: 09/12/2017 CLINICAL DATA:  Slurred speech, left-sided facial numbness EXAM: CT HEAD WITHOUT CONTRAST TECHNIQUE: Contiguous axial images were obtained from the base of the skull through the vertex without intravenous contrast. COMPARISON:  03/06/2011 FINDINGS: Brain: No acute intracranial abnormality. Specifically, no hemorrhage, hydrocephalus, mass lesion, acute infarction, or significant intracranial injury. Vascular: No hyperdense vessel or unexpected calcification. Skull: No acute calvarial abnormality. Sinuses/Orbits: Visualized paranasal sinuses and mastoids clear. Orbital soft tissues unremarkable. Other: None IMPRESSION: Normal study. Electronically Signed   By: Charlett NoseKevin  Dover M.D.   On: 09/12/2017 16:40   Mr Brain Wo Contrast  Result Date: 09/13/2017 CLINICAL DATA:  Slurred speech, LEFT-sided numbness, and dizziness. This began 09/12/2017. EXAM: MRI HEAD WITHOUT CONTRAST MRA HEAD WITHOUT CONTRAST TECHNIQUE: Multiplanar, multiecho pulse sequences of the brain and surrounding structures were  obtained without intravenous contrast. Angiographic images of the head were obtained using MRA technique without contrast. COMPARISON:  CT head 09/12/2017. FINDINGS: MRI HEAD FINDINGS Brain: No acute infarction, hemorrhage, hydrocephalus, extra-axial collection or mass lesion. Normal for age cerebral volume. No significant white matter disease. Vascular: Flow voids are maintained throughout the carotid, basilar, and vertebral arteries. There are no areas of chronic hemorrhage. Skull and upper cervical spine: Unremarkable visualized calvarium, skullbase, and cervical vertebrae. Pituitary, pineal, cerebellar tonsils unremarkable. No upper cervical cord lesions. Sinuses/Orbits: No orbital masses or proptosis. Globes appear symmetric. Sinuses appear well aerated, without evidence for air-fluid level. Other: None. MRA HEAD FINDINGS The internal carotid arteries are widely patent. The basilar artery is widely patent with vertebrals both contributing, RIGHT slightly larger. There is no proximal stenosis of the anterior, middle, or posterior cerebral arteries. No saccular aneurysm. IMPRESSION: No acute intracranial abnormality. No cause is seen for the reported symptoms. Unremarkable MRA of the intracranial circulation. Electronically Signed   By: Elsie StainJohn T Curnes M.D.   On: 09/13/2017 10:09   [place] Carotid Bilateral (at [facility] And Ap Only)  Result Date: 09/13/2017 CLINICAL DATA:  46 year old female with left-sided weakness, numbness and dysarthria EXAM: BILATERAL CAROTID DUPLEX ULTRASOUND TECHNIQUE: Wallace CullensGray scale imaging, color Doppler and duplex ultrasound were performed of bilateral carotid and vertebral arteries in the neck. COMPARISON:  Brain MRI 09/13/2017 FINDINGS: Criteria: Quantification of carotid stenosis is based on velocity parameters that correlate the residual internal carotid diameter with NASCET-based stenosis levels, using the diameter of the distal internal carotid lumen as the denominator for stenosis  measurement. The following velocity measurements were obtained: RIGHT ICA:  126/40 cm/sec CCA:  86/31 cm/sec SYSTOLIC ICA/CCA RATIO:  1.5 DIASTOLIC ICA/CCA RATIO:  1.3 ECA:  116 cm/sec LEFT ICA:  112/48 cm/sec CCA:  109/34 cm/sec SYSTOLIC ICA/CCA RATIO:  1.0 DIASTOLIC ICA/CCA RATIO:  1.4 ECA:  95 cm/sec RIGHT CAROTID ARTERY: Trace atherosclerotic plaque in the proximal internal carotid artery. There is no elevation of the peak systolic velocity in this region. More distally, in a region of tortuosity there is mild elevation of the peak systolic velocity which is likely spurious. RIGHT VERTEBRAL ARTERY:  Patent with antegrade flow. LEFT CAROTID ARTERY: Mild atherosclerotic plaque in the proximal internal carotid artery. By peak systolic velocity criteria, the estimated stenosis is less than 50%. LEFT VERTEBRAL ARTERY:  Patent with antegrade flow. IMPRESSION: 1. Mild (1-49%) stenosis proximal right internal carotid artery secondary to trace smooth heterogeneous atherosclerotic plaque. 2. Mild (1-49%) stenosis proximal left internal carotid artery secondary to trace smooth heterogeneous atherosclerotic plaque. 3. The vertebral arteries are patent with normal antegrade flow. Signed, Sterling Big, MD [facility] Electronically Signed   By: Malachy Moan M.D.   On: 09/13/2017 12:39   Mr Maxine Glenn Head/brain ZO Cm  Result Date: 09/13/2017 CLINICAL DATA:  Slurred speech, LEFT-sided numbness, and dizziness. This began 09/12/2017. EXAM: MRI HEAD WITHOUT CONTRAST MRA HEAD WITHOUT CONTRAST TECHNIQUE: Multiplanar, multiecho pulse sequences of the brain and surrounding structures were obtained without intravenous contrast. Angiographic images of the head were obtained using MRA technique without contrast. COMPARISON:  CT head 09/12/2017. FINDINGS: MRI HEAD FINDINGS Brain: No acute infarction, hemorrhage, hydrocephalus, extra-axial collection or mass lesion.  Normal for age cerebral volume. No significant white matter disease. Vascular: Flow voids are maintained throughout the carotid, basilar, and vertebral arteries. There are no areas of chronic hemorrhage. Skull and upper cervical spine: Unremarkable visualized calvarium, skullbase, and cervical vertebrae. Pituitary, pineal, cerebellar tonsils unremarkable. No upper cervical cord lesions. Sinuses/Orbits: No orbital masses or proptosis. Globes appear symmetric. Sinuses appear well aerated, without evidence for air-fluid level. Other: None. MRA HEAD FINDINGS The internal carotid arteries are widely patent. The basilar artery is widely patent with vertebrals both contributing, RIGHT slightly larger. There is no proximal stenosis of the anterior, middle, or posterior cerebral arteries. No saccular aneurysm. IMPRESSION: No acute intracranial abnormality. No cause is seen for the reported symptoms. Unremarkable MRA of the intracranial circulation. Electronically Signed   By: Elsie Stain M.D.   On: 09/13/2017 10:09        Discharge Exam: Vitals:   09/14/17 0305 09/14/17 0656  BP: 110/70 135/90  Pulse: 84 86  Resp: 16 18  Temp: 98.3 F (36.8 C) 98.4 F (36.9 C)  SpO2: 95% 94%   Vitals:   09/13/17 2342 09/13/17 2357 09/14/17 0305 09/14/17 0656  BP: 135/76 (!) 104/56 110/70 135/90  Pulse: 85 83 84 86  Resp: 18 18 16 18   Temp: 97.8 F (36.6 C) 97.6 F (36.4 C) 98.3 F (36.8 C) 98.4 F (36.9 C)  TempSrc: Oral Oral Oral Oral  SpO2: 94% 95% 95% 94%  Weight:      Height:        General: Pt is alert, awake, not in acute distress Cardiovascular: RRR, S1/S2 +, no rubs, no gallops Respiratory: CTA bilaterally, no wheezing, no rhonchi Abdominal: Soft, NT, ND, bowel sounds + Extremities: no edema, no cyanosis   The results of significant diagnostics from this hospitalization (including imaging, microbiology, ancillary and  laboratory) are listed below for reference.    Significant Diagnostic  Studies: Dg Chest 2 View  Result Date: 09/12/2017 CLINICAL DATA:  TIA EXAM: CHEST  2 VIEW COMPARISON:  10/19/2016 FINDINGS: Cardiomegaly. No confluent airspace opacities or effusions. No acute bony abnormality. IMPRESSION: Cardiomegaly.  No active disease. Electronically Signed   By: Charlett NoseKevin  Dover M.D.   On: 09/12/2017 20:20   Ct Head Wo Contrast  Result Date: 09/12/2017 CLINICAL DATA:  Slurred speech, left-sided facial numbness EXAM: CT HEAD WITHOUT CONTRAST TECHNIQUE: Contiguous axial images were obtained from the base of the skull through the vertex without intravenous contrast. COMPARISON:  03/06/2011 FINDINGS: Brain: No acute intracranial abnormality. Specifically, no hemorrhage, hydrocephalus, mass lesion, acute infarction, or significant intracranial injury. Vascular: No hyperdense vessel or unexpected calcification. Skull: No acute calvarial abnormality. Sinuses/Orbits: Visualized paranasal sinuses and mastoids clear. Orbital soft tissues unremarkable. Other: None IMPRESSION: Normal study. Electronically Signed   By: Charlett NoseKevin  Dover M.D.   On: 09/12/2017 16:40   Mr Brain Wo Contrast  Result Date: 09/13/2017 CLINICAL DATA:  Slurred speech, LEFT-sided numbness, and dizziness. This began 09/12/2017. EXAM: MRI HEAD WITHOUT CONTRAST MRA HEAD WITHOUT CONTRAST TECHNIQUE: Multiplanar, multiecho pulse sequences of the brain and surrounding structures were obtained without intravenous contrast. Angiographic images of the head were obtained using MRA technique without contrast. COMPARISON:  CT head 09/12/2017. FINDINGS: MRI HEAD FINDINGS Brain: No acute infarction, hemorrhage, hydrocephalus, extra-axial collection or mass lesion. Normal for age cerebral volume. No significant white matter disease. Vascular: Flow voids are maintained throughout the carotid, basilar, and vertebral arteries. There are no areas of chronic hemorrhage. Skull and upper cervical spine: Unremarkable visualized calvarium, skullbase,  and cervical vertebrae. Pituitary, pineal, cerebellar tonsils unremarkable. No upper cervical cord lesions. Sinuses/Orbits: No orbital masses or proptosis. Globes appear symmetric. Sinuses appear well aerated, without evidence for air-fluid level. Other: None. MRA HEAD FINDINGS The internal carotid arteries are widely patent. The basilar artery is widely patent with vertebrals both contributing, RIGHT slightly larger. There is no proximal stenosis of the anterior, middle, or posterior cerebral arteries. No saccular aneurysm. IMPRESSION: No acute intracranial abnormality. No cause is seen for the reported symptoms. Unremarkable MRA of the intracranial circulation. Electronically Signed   By: Elsie StainJohn T Curnes M.D.   On: 09/13/2017 10:09   [place] Carotid Bilateral (at [facility] And Ap Only)  Result Date: 09/13/2017 CLINICAL DATA:  46 year old female with left-sided weakness, numbness and dysarthria EXAM: BILATERAL CAROTID DUPLEX ULTRASOUND TECHNIQUE: Wallace CullensGray scale imaging, color Doppler and duplex ultrasound were performed of bilateral carotid and vertebral arteries in the neck. COMPARISON:  Brain MRI 09/13/2017 FINDINGS: Criteria: Quantification of carotid stenosis is based on velocity parameters that correlate the residual internal carotid diameter with NASCET-based stenosis levels, using the diameter of the distal internal carotid lumen as the denominator for stenosis measurement. The following velocity measurements were obtained: RIGHT ICA:  126/40 cm/sec CCA:  86/31 cm/sec SYSTOLIC ICA/CCA RATIO:  1.5 DIASTOLIC ICA/CCA RATIO:  1.3 ECA:  116 cm/sec LEFT ICA:  112/48 cm/sec CCA:  109/34 cm/sec SYSTOLIC ICA/CCA RATIO:  1.0 DIASTOLIC ICA/CCA RATIO:  1.4 ECA:  95 cm/sec RIGHT CAROTID ARTERY: Trace atherosclerotic plaque in the proximal internal carotid artery. There is no elevation of the peak systolic velocity in this region. More distally, in a region of tortuosity there is mild elevation of the peak systolic velocity which is  likely spurious. RIGHT VERTEBRAL ARTERY:  Patent with antegrade flow. LEFT CAROTID ARTERY: Mild atherosclerotic plaque in the proximal internal carotid  artery. By peak systolic velocity criteria, the estimated stenosis is less than 50%. LEFT VERTEBRAL ARTERY:  Patent with antegrade flow. IMPRESSION: 1. Mild (1-49%) stenosis proximal right internal carotid artery secondary to trace smooth heterogeneous atherosclerotic plaque. 2. Mild (1-49%) stenosis proximal left internal carotid artery secondary to trace smooth heterogeneous atherosclerotic plaque. 3. The vertebral arteries are patent with normal antegrade flow. Signed, Sterling Big, MD [facility] Electronically Signed   By: Malachy Moan M.D.   On: 09/13/2017 12:39   Mr Maxine Glenn Head/brain ZO Cm  Result Date: 09/13/2017 CLINICAL DATA:  Slurred speech, LEFT-sided numbness, and dizziness. This began 09/12/2017. EXAM: MRI HEAD WITHOUT CONTRAST MRA HEAD WITHOUT CONTRAST TECHNIQUE: Multiplanar, multiecho pulse sequences of the brain and surrounding structures were obtained without intravenous contrast. Angiographic images of the head were obtained using MRA technique without contrast. COMPARISON:  CT head 09/12/2017. FINDINGS: MRI HEAD FINDINGS Brain: No acute infarction, hemorrhage, hydrocephalus, extra-axial collection or mass lesion. Normal for age cerebral volume. No significant white matter disease. Vascular: Flow voids are maintained throughout the carotid, basilar, and vertebral arteries. There are no areas of chronic hemorrhage. Skull and upper cervical spine: Unremarkable visualized calvarium, skullbase, and cervical vertebrae. Pituitary, pineal, cerebellar tonsils unremarkable. No upper cervical cord lesions. Sinuses/Orbits: No orbital masses or proptosis. Globes appear symmetric. Sinuses appear well aerated, without evidence for air-fluid level. Other: None. MRA HEAD FINDINGS The internal  carotid arteries are widely patent. The basilar artery is widely patent with vertebrals both contributing, RIGHT slightly larger. There is no proximal stenosis of the anterior, middle, or posterior cerebral arteries. No saccular aneurysm. IMPRESSION: No acute intracranial abnormality. No cause is seen for the reported symptoms. Unremarkable MRA of the intracranial circulation. Electronically Signed   By: Elsie Stain M.D.   On: 09/13/2017 10:09     Microbiology: No results found for this or any previous visit (from the past 240 hour(s)).   Labs: Basic Metabolic Panel: Recent Labs  Lab 09/12/17 1606 09/12/17 1716  NA 138 140  K 4.2 4.4  CL 105 104  CO2 26  --   GLUCOSE 109* 102*  BUN 10 9  CREATININE 0.87 0.90  CALCIUM 9.6  --    Liver Function Tests: Recent Labs  Lab 09/12/17 1606  AST 20  ALT 20  ALKPHOS 119  BILITOT 0.4  PROT 6.9  ALBUMIN 3.9   No results for input(s): LIPASE, AMYLASE in the last 168 hours. No results for input(s): AMMONIA in the last 168 hours. CBC: Recent Labs  Lab 09/12/17 1606 09/12/17 1716  WBC 6.5  --   NEUTROABS 3.0  --   HGB 13.1 12.9  HCT 40.6 38.0  MCV 96.2  --   PLT 218  --    Cardiac Enzymes: No results for input(s): CKTOTAL, CKMB, CKMBINDEX, TROPONINI in the last 168 hours. BNP: Invalid input(s): POCBNP CBG: Recent Labs  Lab 09/12/17 1656  GLUCAP 101*    Time coordinating discharge:  Greater than 30 minutes  Signed:  Catarina Hartshorn, DO [facility] Pager: [phone] 09/14/2017, 10:56 AM

## 2017-09-15 LAB — URINE CULTURE

## 2017-09-15 LAB — RPR: RPR: NONREACTIVE

## 2017-09-15 LAB — HOMOCYSTEINE: Homocysteine: 11.1 umol/L (ref 0.0–15.0)

## 2017-09-16 ENCOUNTER — Ambulatory Visit (INDEPENDENT_AMBULATORY_CARE_PROVIDER_SITE_OTHER): Payer: Self-pay | Admitting: Neurology

## 2017-09-16 DIAGNOSIS — G40909 Epilepsy, unspecified, not intractable, without status epilepticus: Secondary | ICD-10-CM

## 2017-09-17 ENCOUNTER — Other Ambulatory Visit: Payer: Self-pay

## 2017-09-17 ENCOUNTER — Encounter (HOSPITAL_COMMUNITY): Payer: Self-pay

## 2017-09-17 ENCOUNTER — Ambulatory Visit (HOSPITAL_COMMUNITY): Payer: Medicaid Other | Attending: Family Medicine

## 2017-09-17 DIAGNOSIS — R278 Other lack of coordination: Secondary | ICD-10-CM | POA: Diagnosis present

## 2017-09-17 DIAGNOSIS — R29898 Other symptoms and signs involving the musculoskeletal system: Secondary | ICD-10-CM | POA: Diagnosis present

## 2017-09-17 DIAGNOSIS — M6281 Muscle weakness (generalized): Secondary | ICD-10-CM

## 2017-09-17 DIAGNOSIS — R262 Difficulty in walking, not elsewhere classified: Secondary | ICD-10-CM | POA: Diagnosis present

## 2017-09-17 DIAGNOSIS — R2681 Unsteadiness on feet: Secondary | ICD-10-CM | POA: Diagnosis present

## 2017-09-17 NOTE — Therapy (Signed)
[facility] [address] [place], [place], [address] Phone: [phone]   Fax:  [phone]  Physical Therapy Evaluation  Patient Details  Name: Dominique Harrington MRN: [id number] Date of Birth: 1970-11-16 Referring Provider: Catarina Hartshorn, MD   Encounter Date: 09/17/2017  PT End of Session - 09/17/17 1139    Visit Number  1    Number of Visits  13    Date for PT Re-Evaluation  10/08/17 mini reassess    Authorization Type  Self-pay (waiting on Medicaid approval)    Authorization Time Period  09/17/17 to 10/29/17    PT Start Time  0815    PT Stop Time  0856    PT Time Calculation (min)  41 min    Equipment Utilized During Treatment  Gait belt    Activity Tolerance  Patient limited by fatigue;Patient limited by pain    Behavior During Therapy  [facility] for tasks assessed/performed       Past Medical History:  Diagnosis Date  . Anxiety   . Dyspnea on exertion 2013   Echo, EF =>55%  . Hypertension   . Migraines   . Tachycardia     Past Surgical History:  Procedure Laterality Date  . BACK SURGERY    . COSMETIC SURGERY    . HERNIA REPAIR    . TUBAL LIGATION  2004    There were no vitals filed for this visit.   Subjective Assessment - 09/17/17 0824    Subjective  Pt states that she woke up on 09/12/17 very disoriented, not making sense, and had numbness on the L side of her face and lost function on her L side. She had an EEG yesterday to r/o complex seizures as well but those results are pending. Her speech and facial droop has since improved, but she's still having the most difficulty with L sided weakness. She reports a fall on Wednesday night when she got really dizzy while ambulating to the bathroom, but no other falls in the last 6 months. Pt states that she was completely independent prior to this TIA. She is curerntly limited in her ability to perform ADLs and IADLs due to the weakness.     Limitations  Walking;House hold activities     How long can you sit comfortably?  no issues    How long can you stand comfortably?  a good 30 mins    How long can you walk comfortably?  maybe 5 mins     Patient Stated Goals  get back to self and lose the cane    Currently in Pain?  Yes    Pain Score  6     Pain Location  Leg    Pain Orientation  Left    Pain Descriptors / Indicators  Sharp    Pain Type  Acute pain    Pain Onset  In the past 7 days    Pain Frequency  Intermittent    Aggravating Factors   sitting a long time    Pain Relieving Factors  unsure    Effect of Pain on Daily Activities  it hurts         [facility] PT Assessment - 09/17/17 0001      Assessment   Medical Diagnosis  TIA    Referring Provider  Catarina Hartshorn, MD    Next MD Visit  09/23/17 Estelle June    Prior Therapy  none for current situation; 2002 for sciatica  Balance Screen   Has the patient fallen in the past 6 months  Yes    How many times?  1    Has the patient had a decrease in activity level because of a fear of falling?   Yes    Is the patient reluctant to leave their home because of a fear of falling?   Yes      Home Environment   Living Environment  Private residence    Additional Comments  no stairs      Prior Function   Level of Independence  Independent    Leisure  reading, family stuff      Sensation   Light Touch  Impaired Detail    Light Touch Impaired Details  Impaired LLE    Additional Comments  duller L4-S1      Coordination   Heel Shin Test  LLe limited due to weakness      ROM / Strength   AROM / PROM / Strength  Strength      Strength   Strength Assessment Site  Hip;Knee;Ankle    Right Hip Flexion  5/5    Right Hip Extension  4/5    Right Hip ABduction  4+/5    Left Hip Flexion  4/5    Left Hip Extension  4-/5    Left Hip ABduction  4/5    Right Knee Flexion  5/5    Right Knee Extension  5/5    Left Knee Flexion  4+/5    Left Knee Extension  4+/5    Right Ankle Dorsiflexion  5/5    Left Ankle  Dorsiflexion  4/5      Ambulation/Gait   Ambulation Distance (Feet)  178 Feet 3MWT    Assistive device  Straight cane    Gait Pattern  Step-through pattern;Decreased arm swing - left;Decreased step length - right;Decreased stance time - left;Decreased stride length;Decreased hip/knee flexion - left;Decreased dorsiflexion - left;Decreased weight shift to left;Antalgic;Trendelenburg decr hip ext, decr push-off    Gait velocity  0.7191m/s (average)    Gait Comments  incr LLE pain during test      Balance   Balance Assessed  Yes      Static Standing Balance   Static Standing - Balance Support  No upper extremity supported    Static Standing Balance -  Activities   Single Leg Stance - Right Leg;Single Leg Stance - Left Leg    Static Standing - Comment/# of Minutes  R: 15 sec, L: 5 sec or < (LOB to the R)      Standardized Balance Assessment   Standardized Balance Assessment  Five Times Sit to Stand;Dynamic Gait Index    Five times sit to stand comments   42 sec, no UE; increased weight shift to the R, min LOB towards the R      Dynamic Gait Index   DGI comment:  to be completed next visit         Objective measurements completed on examination: See above findings.      PT Education - 09/17/17 0856    Education provided  Yes    Education Details  exam findings, POC, HEP    Person(s) Educated  Patient    Methods  Explanation;Demonstration;Handout    Comprehension  Verbalized understanding;Returned demonstration       PT Short Term Goals - 09/17/17 1146      PT SHORT TERM GOAL #1   Title  Pt will be independent with  HEP and perform consistently in order to maximize return to PLOF.    Time  3    Period  Weeks    Status  New    Target Date  10/08/17      PT SHORT TERM GOAL #2   Title  Pt will have 1/2 grade improvement in MMT in order to maximize balance and gait.     Time  3    Period  Weeks    Status  New      PT SHORT TERM GOAL #3   Title  Pt will report being able to  cook for 15 mins without requiring a rest break in order to demo improved functional strength and tolerance to standing and walking.     Time  3    Period  Weeks    Status  New        PT Long Term Goals - 09/17/17 1147      PT LONG TERM GOAL #1   Title  Pt will have 1 grade improvment in MMT of all muscle groups tested in order to further promote pt's balance, gait, and return to regular exercise routine.     Time  6    Period  Weeks    Status  New    Target Date  10/29/17      PT LONG TERM GOAL #2   Title  Pt will be able to perform 5xSTS in 20 sec or < with no evidence of unsteadiness, proper mechanics, and no UE support in order to demo improved balance and functional strength.     Time  6    Period  Weeks    Status  New      PT LONG TERM GOAL #3   Title  Pt will be able to perform SLS on LLE for at least 15 sec to be symmetrical with RLE in order to maximize pt's gait on uneven ground and decrease risk for falls.     Time  6    Period  Weeks    Status  New      PT LONG TERM GOAL #4   Title  Pt will be able ambulate at least 61800ft or > during 3MWT with LRAD, no pain, and gait WFL, in order to demo improved endurance and functional tolerance to maximize to return to her regular walking program.    Time  6    Period  Weeks    Status  New      PT LONG TERM GOAL #5   Title  Pt will score at least 20/24 on DGI to demo improved dynamic balance and decrease risk for falls.     Time  6    Period  Weeks    Status  New             Plan - 09/17/17 1142    Clinical Impression Statement  Pt is pleasant 46YO F who presents to OPPT s/p TIA on 09/12/17. Pt currently presents with deficits in LLE strength compared to RLE, balance, gait, endurance, functional strength, and ability to perform ADLs and IADLs due to weakness and LLE pain. Coordination WNL, light touch slightly impaired (see above). Pt scoring well below her age-matched peers on 5xSTS, SLS, 3MWT and her gait velocity  is slow and labored. Pt needs skilled PT intervention to address these impairments in order to maximize her overall function at home and in the community and promote return to PLOF.  History and Personal Factors relevant to plan of care:  HTN, dyspnea on exertion, migraines, tachycardia, anxiety; young, appears motivated to participate with PT    Clinical Presentation  Stable    Clinical Presentation due to:  MMT, SLS, 5xSTS, , endurance, pain    Clinical Decision Making  Low    Rehab Potential  Good    PT Frequency  2x / week    PT Duration  6 weeks    PT Treatment/Interventions  ADLs/Self Care Home Management;Cryotherapy;Electrical Stimulation;Moist Heat;DME Instruction;Gait training;Stair training;Functional mobility training;Therapeutic activities;Therapeutic exercise;Balance training;Neuromuscular re-education;Patient/family education;Manual techniques;Passive range of motion;Dry needling;Energy conservation;Taping;Splinting    PT Next Visit Plan  review goals, perform DGI, functional and core strengthening, gait training, balance    PT Home Exercise Plan  eval: bridges, s/l clams RTB    Consulted and Agree with Plan of Care  Patient       Patient will benefit from skilled therapeutic intervention in order to improve the following deficits and impairments:  Abnormal gait, Cardiopulmonary status limiting activity, Decreased activity tolerance, Decreased balance, Decreased endurance, Decreased mobility, Decreased strength, Difficulty walking, Impaired sensation, Improper body mechanics, Postural dysfunction, Obesity, Pain  Visit Diagnosis: Muscle weakness (generalized) - Plan: PT plan of care cert/re-cert  Difficulty in walking, not elsewhere classified - Plan: PT plan of care cert/re-cert  Unsteadiness on feet - Plan: PT plan of care cert/re-cert     Problem List Patient Active Problem List   Diagnosis Date Noted  . Sensory disturbance 09/14/2017  . Left hemiparesis (HCC)  09/14/2017  . Dysarthria   . TIA (transient ischemic attack) 09/12/2017  . Obesity, unspecified 11/11/2015  . Essential hypertension, benign 08/12/2015  . Hyperlipidemia 08/12/2015  . Cigarette nicotine dependence, uncomplicated 08/12/2015  . Headache, migraine 08/12/2015       Jac Canavan PT, DPT  [facility] [address] [place], [place], [address] Phone: [phone]   Fax:  [phone]  Name: Dominique Harrington MRN: [id number] Date of Birth: [birth year]

## 2017-09-17 NOTE — Patient Instructions (Signed)
  BRIDGING  While lying on your back with knees bent, tighten your lower abdominals, squeeze your buttocks and then raise your buttocks off the floor/bed as creating a "Bridge" with your body. Hold and then lower yourself and repeat.  Perform 1-2x/day, 2-3 sets of 10 reps each    ELASTIC BAND - SIDELYING CLAM - CLAMSHELL   While lying on your side with your knees bent and an elastic band wrapped around your knees, draw up the top knee while keeping contact of your feet together as shown.   Do not let your pelvis roll back during the lifting movement.    Perform 1-2x/day, 2-3 sets of 10 reps each

## 2017-09-20 NOTE — Procedures (Signed)
ELECTROENCEPHALOGRAM REPORT  Date of Study: 09/16/2017  Patient's Name: Dominique Harrington MRN: [id number] Date of Birth: 1970/12/15  Referring Provider: Dr. Onalee Huaavid Tat  Clinical History: This is a 46 year old woman with an episode of dysarthria, left-sided numbness and weakness  Medications: aspirin 81 MG chewable tablet atorvastatin 20 MG tablet FISH OIL PO hydrOXYzine 50 MG capsule MAXALT-MLT 10 MG promethazine 25 MG tablet  Technical Summary: A multichannel digital EEG recording measured by the international 10-20 system with electrodes applied with paste and impedances below 5000 ohms performed in our laboratory with EKG monitoring in an awake and asleep patient.  Hyperventilation was performed. Photic stimulation was not performed.  The digital EEG was referentially recorded, reformatted, and digitally filtered in a variety of bipolar and referential montages for optimal display.    Description: The patient is awake and drowsy during the recording.  During maximal wakefulness, there is a symmetric, medium voltage 9 Hz posterior dominant rhythm that attenuates with eye opening.  The record is symmetric.  During drowsiness, there is an increase in theta slowing of the background, with shifting asymmetry over the bilateral temporal regions. Deeper stages of sleep were not captured. Hyperventilation did not elicit any abnormalities.  There were no epileptiform discharges or electrographic seizures seen.    EKG lead was unremarkable.  Impression: This awake and drowsy EEG is normal.    Clinical Correlation: A normal EEG does not exclude a clinical diagnosis of epilepsy. Clinical correlation is advised.   Patrcia DollyKaren Latiqua Daloia, M.D.

## 2017-09-22 ENCOUNTER — Ambulatory Visit (HOSPITAL_COMMUNITY): Payer: Medicaid Other

## 2017-09-22 ENCOUNTER — Encounter (HOSPITAL_COMMUNITY): Payer: Self-pay

## 2017-09-22 VITALS — BP 137/104

## 2017-09-22 DIAGNOSIS — R2681 Unsteadiness on feet: Secondary | ICD-10-CM

## 2017-09-22 DIAGNOSIS — M6281 Muscle weakness (generalized): Secondary | ICD-10-CM | POA: Diagnosis not present

## 2017-09-22 DIAGNOSIS — R262 Difficulty in walking, not elsewhere classified: Secondary | ICD-10-CM

## 2017-09-22 NOTE — Therapy (Signed)
[facility] [address] , [place], [address] Phone: [phone]   Fax:  [phone]  Physical Therapy Treatment  Patient Details  Name: Dominique Harrington MRN: [id number] Date of Birth: [birth year] Referring Provider: Catarina Hartshornavid Tat, MD   Encounter Date: 09/22/2017  PT End of Session - 09/22/17 0918    Visit Number  2    Number of Visits  13    Date for PT Re-Evaluation  10/08/17    Authorization Type  Self-pay (waiting on Medicaid approval)    Authorization Time Period  09/17/17 to 10/29/17    PT Start Time  0908    PT Stop Time  0950    PT Time Calculation (min)  42 min    Equipment Utilized During Treatment  Gait belt    Activity Tolerance  Patient limited by fatigue;No increased pain    Behavior During Therapy  WFL for tasks assessed/performed       Past Medical History:  Diagnosis Date  . Anxiety   . Dyspnea on exertion 2013   Echo, EF =>55%  . Hypertension   . Migraines   . Tachycardia     Past Surgical History:  Procedure Laterality Date  . BACK SURGERY    . COSMETIC SURGERY    . HERNIA REPAIR    . TUBAL LIGATION  2004    Vitals:   09/22/17 1406  BP: (!) 137/104    Subjective Assessment - 09/22/17 0910    Subjective  Pt stated she is feeling good today.  Has began HEP 2x/day and walking program without questions.  Does reports 2 falls yesterday feels may be related to dizziness (4 times today since TIA) required assistance to stand back up, no reoprts of pain.      Patient Stated Goals  get back to self and lose the cane    Currently in Pain?  No/denies         [facility] - 09/22/17 0001      Assessment   Medical Diagnosis  TIA    Referring Provider  Catarina Hartshornavid Tat, MD    Next MD Visit  09/23/17 Jacquelin HawkingShannon McElroy    Prior Therapy  none for current situation; 2002 for sciatica      Dynamic Gait Index   Level Surface  Mild Impairment    Change in Gait Speed  Mild Impairment    Gait with Horizontal  Head Turns  Mild Impairment    Gait with Vertical Head Turns  Moderate Impairment stops and c/o dizziness when looking down (2 times)    Gait and Pivot Turn  Mild Impairment    Step Over Obstacle  Mild Impairment    Step Around Obstacles  Mild Impairment    Steps  Moderate Impairment    Total Score  14    DGI comment:  c/o dizziness while looking down                  [facility] Adult PT Treatment/Exercise - 09/22/17 0001      Exercises   Exercises  Knee/Hip      Knee/Hip Exercises: Standing   Heel Raises  10 reps    Heel Raises Limitations  Toe raises 10x    Hip Abduction  10 reps    Abduction Limitations  fatigue at 7reps    Functional Squat  5 reps    Functional Squat Limitations  good form following initial cueing    Other Standing Knee Exercises  marching 10x 3-5" holds      Knee/Hip Exercises: Seated   Sit to Sand  without UE support;10 reps eccentric control      Knee/Hip Exercises: Supine   Bridges  10 reps             PT Education - 09/22/17 1259    Education provided  Yes    Education Details  Reviewed goals, assured compliance and proper form with HEP and copy of eval given to pt.      Person(s) Educated  Patient    Methods  Explanation;Demonstration;Handout    Comprehension  Verbalized understanding;Returned demonstration       PT Short Term Goals - 09/17/17 1146      PT SHORT TERM GOAL #1   Title  Pt will be independent with HEP and perform consistently in order to maximize return to PLOF.    Time  3    Period  Weeks    Status  New    Target Date  10/08/17      PT SHORT TERM GOAL #2   Title  Pt will have 1/2 grade improvement in MMT in order to maximize balance and gait.     Time  3    Period  Weeks    Status  New      PT SHORT TERM GOAL #3   Title  Pt will report being able to cook for 15 mins without requiring a rest break in order to demo improved functional strength and tolerance to standing and walking.     Time  3    Period   Weeks    Status  New        PT Long Term Goals - 09/17/17 1147      PT LONG TERM GOAL #1   Title  Pt will have 1 grade improvment in MMT of all muscle groups tested in order to further promote pt's balance, gait, and return to regular exercise routine.     Time  6    Period  Weeks    Status  New    Target Date  10/29/17      PT LONG TERM GOAL #2   Title  Pt will be able to perform 5xSTS in 20 sec or < with no evidence of unsteadiness, proper mechanics, and no UE support in order to demo improved balance and functional strength.     Time  6    Period  Weeks    Status  New      PT LONG TERM GOAL #3   Title  Pt will be able to perform SLS on LLE for at least 15 sec to be symmetrical with RLE in order to maximize pt's gait on uneven ground and decrease risk for falls.     Time  6    Period  Weeks    Status  New      PT LONG TERM GOAL #4   Title  Pt will be able ambulate at least 62900ft or > during 3MWT with LRAD, no pain, and gait WFL, in order to demo improved endurance and functional tolerance to maximize to return to her regular walking program.    Time  6    Period  Weeks    Status  New      PT LONG TERM GOAL #5   Title  Pt will score at least 20/24 on DGI to demo improved dynamic balance and decrease risk for falls.  Time  6    Period  Weeks    Status  New            Plan - 09/22/17 1309    Clinical Impression Statement  Reviewed goals, assured compliance and proper form with HEP and copy of eval given to pt.  DGI complete with SPC with significant deficits noted Lt LE.  Pt c/o dizziness when looking down (2x)  BP 137/104 mmHg and HR at 74.  Therex focus on functional strengthening and balance training, pt able to complete all exercises with min cueing for form, with visial musculature fatigue usually around rep 6-7.    Rehab Potential  Good    PT Frequency  2x / week    PT Duration  6 weeks    PT Treatment/Interventions  ADLs/Self Care Home  Management;Cryotherapy;Electrical Stimulation;Moist Heat;DME Instruction;Gait training;Stair training;Functional mobility training;Therapeutic activities;Therapeutic exercise;Balance training;Neuromuscular re-education;Patient/family education;Manual techniques;Passive range of motion;Dry needling;Energy conservation;Taping;Splinting    PT Next Visit Plan  Continue functional and core strengthening, gait training, balance.  Next session begin lunges, tandem stance and SLS, Gait training with SPC for sequence and equal stride length.    PT Home Exercise Plan  eval: bridges, s/l clams RTB       Patient will benefit from skilled therapeutic intervention in order to improve the following deficits and impairments:  Abnormal gait, Cardiopulmonary status limiting activity, Decreased activity tolerance, Decreased balance, Decreased endurance, Decreased mobility, Decreased strength, Difficulty walking, Impaired sensation, Improper body mechanics, Postural dysfunction, Obesity, Pain  Visit Diagnosis: Muscle weakness (generalized)  Difficulty in walking, not elsewhere classified  Unsteadiness on feet     Problem List Patient Active Problem List   Diagnosis Date Noted  . Sensory disturbance 09/14/2017  . Left hemiparesis (HCC) 09/14/2017  . Dysarthria   . TIA (transient ischemic attack) 09/12/2017  . Obesity, unspecified 11/11/2015  . Essential hypertension, benign 08/12/2015  . Hyperlipidemia 08/12/2015  . Cigarette nicotine dependence, uncomplicated 08/12/2015  . Headache, migraine 08/12/2015   Becky Sax, LPTA; CBIS [phone]  Juel Burrow 09/22/2017, 3:31 PM  [facility] [address] [place], [place], [address] Phone: [phone]   Fax:  [phone]  Name: Dominique Harrington MRN: [id number] Date of Birth: 1971-01-14

## 2017-09-23 ENCOUNTER — Encounter (HOSPITAL_COMMUNITY): Payer: Self-pay | Admitting: Specialist

## 2017-09-23 ENCOUNTER — Encounter: Payer: Self-pay | Admitting: Physician Assistant

## 2017-09-23 ENCOUNTER — Ambulatory Visit (HOSPITAL_COMMUNITY): Payer: Medicaid Other | Admitting: Specialist

## 2017-09-23 ENCOUNTER — Ambulatory Visit: Payer: Self-pay | Admitting: Physician Assistant

## 2017-09-23 VITALS — BP 137/91 | HR 63 | Temp 97.7°F

## 2017-09-23 DIAGNOSIS — M6281 Muscle weakness (generalized): Secondary | ICD-10-CM | POA: Diagnosis not present

## 2017-09-23 DIAGNOSIS — R278 Other lack of coordination: Secondary | ICD-10-CM

## 2017-09-23 DIAGNOSIS — R29898 Other symptoms and signs involving the musculoskeletal system: Secondary | ICD-10-CM

## 2017-09-23 DIAGNOSIS — G8194 Hemiplegia, unspecified affecting left nondominant side: Secondary | ICD-10-CM

## 2017-09-23 DIAGNOSIS — F32A Depression, unspecified: Secondary | ICD-10-CM

## 2017-09-23 DIAGNOSIS — G43909 Migraine, unspecified, not intractable, without status migrainosus: Secondary | ICD-10-CM

## 2017-09-23 DIAGNOSIS — F419 Anxiety disorder, unspecified: Secondary | ICD-10-CM

## 2017-09-23 DIAGNOSIS — F329 Major depressive disorder, single episode, unspecified: Secondary | ICD-10-CM

## 2017-09-23 DIAGNOSIS — I10 Essential (primary) hypertension: Secondary | ICD-10-CM

## 2017-09-23 NOTE — Patient Instructions (Signed)
-  [facility] for counseling -stop smoking  -stop metoprolol and restart lisinopril/hctz 20/12.5 -continue therapy

## 2017-09-23 NOTE — Progress Notes (Signed)
BP (!) 137/91   Pulse 63   Temp 97.7 F (36.5 C)   LMP 12/19/2014   SpO2 100%    Subjective:    Patient ID: Antony Hastearrie P Belinsky, female    DOB: 1971-05-01, 46 y.o.   MRN: [id number]  HPI: Antony HasteCarrie P Lippmann is a 46 y.o. female presenting on 09/23/2017 for Follow-up   HPI  Pt says she was told she had a mini stroke.   She was referred to Dr Everlena CooperJaffe but appt not until April.  It appears that Dr Everlena CooperJaffe did her EEG and appears to show no signs of seizure  She has been taking the metoprolol b/c her bp has been running hight but she hasn't been taking the hctz  Pt is still having a hard time remembering thins.  She is also having weakness L arm and LLE.  She is using a cane.    Pt has [employer] (she says she was signed up while in hospital)  Pt is frustrated and depressed and anxious.   Pt has had several episodes of dizziness upon standing.  No LOC.   Relevant past medical, surgical, family and social history reviewed and updated as indicated. Interim medical history since our last visit reviewed. Allergies and medications reviewed and updated.   Current Outpatient Medications:  .  aspirin 81 MG chewable tablet, Chew 1 tablet (81 mg total) by mouth daily., Disp: 30 tablet, Rfl:  .  atorvastatin (LIPITOR) 20 MG tablet, Take 1 tablet (20 mg total) by mouth daily., Disp: 90 tablet, Rfl: 3 .  metoprolol tartrate (LOPRESSOR) 100 MG tablet, Take 100 mg by mouth 2 (two) times daily., Disp: , Rfl:  .  Omega-3 Fatty Acids (FISH OIL PO), Take 2,000 mg by mouth 2 (two) times daily. , Disp: , Rfl:  .  hydrOXYzine (VISTARIL) 50 MG capsule, Take 1 capsule (50 mg total) by mouth at bedtime as needed. (Patient not taking: Reported on 09/23/2017), Disp: 30 capsule, Rfl: 1 .  MAXALT-MLT 10 MG disintegrating tablet, TAKE 1 TABLET BY MOUTH AT ONSET OF HEADACHE, MAY REPEAT IN 2 HOURS. MAX 2 TABLETS IN 24 HOURS. (Patient not taking: Reported on 09/23/2017), Disp: 20 tablet, Rfl: 1 .  promethazine  (PHENERGAN) 25 MG tablet, TAKE 1/2 TO 1 (ONE-HALF TO ONE) TABLET BY MOUTH EVERY 8 HOURS AS NEEDED FOR NAUSEA AND VOMITING (Patient not taking: Reported on 09/23/2017), Disp: 6 tablet, Rfl: 0   Review of Systems  Constitutional: Positive for appetite change and fatigue. Negative for chills, diaphoresis, fever and unexpected weight change.  HENT: Negative for congestion, dental problem, drooling, ear pain, facial swelling, hearing loss, mouth sores, sneezing, sore throat and voice change.   Eyes: Negative for pain, discharge, redness, itching and visual disturbance.  Respiratory: Positive for cough. Negative for choking, shortness of breath and wheezing.   Cardiovascular: Positive for leg swelling. Negative for chest pain and palpitations.  Gastrointestinal: Negative for abdominal pain, blood in stool, constipation, diarrhea and vomiting.  Endocrine: Negative for cold intolerance, heat intolerance and polydipsia.  Genitourinary: Negative for decreased urine volume, dysuria and hematuria.  Musculoskeletal: Positive for gait problem. Negative for arthralgias and back pain.  Skin: Negative for rash.  Allergic/Immunologic: Negative for environmental allergies.  Neurological: Positive for light-headedness and headaches. Negative for seizures and syncope.  Hematological: Negative for adenopathy.  Psychiatric/Behavioral: Positive for agitation and dysphoric mood. Negative for suicidal ideas. The patient is nervous/anxious.     Per HPI unless specifically indicated above  Objective:    BP (!) 137/91   Pulse 63   Temp 97.7 F (36.5 C)   LMP 12/19/2014   SpO2 100%   Wt Readings from Last 3 Encounters:  09/12/17 283 lb 1.1 oz (128.4 kg)  09/01/17 277 lb (125.6 kg)  08/13/17 268 lb (121.6 kg)    Physical Exam  Constitutional: She is oriented to person, place, and time. She appears well-developed and well-nourished.  HENT:  Head: Normocephalic and atraumatic.  Neck: Neck supple.   Cardiovascular: Normal rate and regular rhythm.  Pulmonary/Chest: Effort normal and breath sounds normal.  Abdominal: Soft. Bowel sounds are normal. She exhibits no mass. There is no hepatosplenomegaly. There is no tenderness.  Musculoskeletal: She exhibits no edema.  Lymphadenopathy:    She has no cervical adenopathy.  Neurological: She is alert and oriented to person, place, and time. No cranial nerve deficit. Gait abnormal.  L sided weakness Upper and Lower extremities.  Pt requires cane for ambulation  Skin: Skin is warm and dry.  Psychiatric: She has a normal mood and affect. Her behavior is normal.  Vitals reviewed.       Assessment & Plan:    Encounter Diagnoses  Name Primary?  . Essential hypertension, benign Yes  . Hemiparesis of left nondominant side, unspecified hemiparesis etiology (HCC)   . Migraine syndrome   . Anxiety   . Depression, unspecified depression type      -discontinue Maxalt.  Recommendations per neurology -pt to Stop metoprolol and restart lisinopril/hctz 20/12.5 to help with dizzy episodes -encouraged pt to contact [facility] for anxiety and depression and gave her information for their walk-in services -counseled on smoking cessation -Recheck bp 1 month.  RTO sooner if bp elevated or for other problems.

## 2017-09-23 NOTE — Therapy (Signed)
[facility] [address] [place], [place], [address] Phone: [phone]   Fax:  [phone]  Occupational Therapy Evaluation  Patient Details  Name: Dominique Harrington MRN: [id number] Date of Birth: 10-26-1970 No Data Recorded  Encounter Date: 09/23/2017  OT End of Session - 09/23/17 1658    Visit Number  1    Number of Visits  16    Date for OT Re-Evaluation  11/22/17 10/23/17 mini reassess    Authorization Type  medicaid potential    OT Start Time  1345    OT Stop Time  1430    OT Time Calculation (min)  45 min    Activity Tolerance  Patient tolerated treatment well    Behavior During Therapy  [facility] for tasks assessed/performed       Past Medical History:  Diagnosis Date  . Anxiety   . Dyspnea on exertion 2013   Echo, EF =>55%  . Hypertension   . Migraines   . Tachycardia     Past Surgical History:  Procedure Laterality Date  . BACK SURGERY    . COSMETIC SURGERY    . HERNIA REPAIR    . TUBAL LIGATION  2004    There were no vitals filed for this visit.  Subjective Assessment - 09/23/17 1656    Subjective   S: I know its improving, I just wish it were quicker.     Special Tests  FOTO 42.97    Currently in Pain?  No/denies        [facility] OT Assessment - 09/23/17 0001      Assessment   Medical Diagnosis  TIA with left upper extremity weaknes    Referring Provider  Dr. Standley Dakins    Onset Date/Surgical Date  09/12/17    Prior Therapy  none for current situation; 2002 for sciatica      Precautions   Precautions  Fall      Balance Screen   Has the patient fallen in the past 6 months  No    Has the patient had a decrease in activity level because of a fear of falling?   No    Is the patient reluctant to leave their home because of a fear of falling?   No      Home  Environment   Family/patient expects to be discharged to:  Private residence    Living Arrangements  Spouse/significant other    Available Help at  Discharge  Family    Type of Home  House      Prior Function   Level of Independence  Independent    Vocation  Unemployed    Leisure  cooking      ADL   ADL comments  Patient does not gesture with left arm and typically would, lifting pots and pans is difficult, fastening buttons, gripping items and squeezing items is difficult      Written Expression   Dominant Hand  Right    Handwriting  Not legible      Vision - History   Baseline Vision  Wears glasses only for reading      Cognition   Overall Cognitive Status  Within Functional Limits for tasks assessed    Attention  -- needs further assessment    Problem Solving  Appears intact    Executive Function  Reasoning;Sequencing;Organizing;Decision Making    Reasoning  -- needs further assessment    Sequencing  -- needs further assessment  Organizing  -- needs futher assessment    Decision Making  Appears intact      Observation/Other Assessments   Focus on Therapeutic Outcomes (FOTO)   42.97      Sensation   Light Touch  Appears Intact      Coordination   9 Hole Peg Test  Right;Left    Right 9 Hole Peg Test  24.87"    Left 9 Hole Peg Test  31.88"      ROM / Strength   AROM / PROM / Strength  Strength      Strength   Strength Assessment Site  Shoulder;Elbow;Forearm;Wrist    Right/Left Shoulder  Left    Left Shoulder Flexion  4/5    Left Shoulder Extension  4/5    Left Shoulder ABduction  4/5    Left Shoulder Internal Rotation  4/5    Left Shoulder External Rotation  4/5    Right/Left Elbow  Left    Left Elbow Flexion  4-/5    Left Elbow Extension  4-/5    Right/Left Forearm  Left    Left Forearm Pronation  4-/5    Left Forearm Supination  4-/5    Right/Left Wrist  Left    Left Wrist Flexion  4-/5    Left Wrist Extension  4-/5      Hand Function   Right Hand Grip (lbs)  59    Right Hand Lateral Pinch  18 lbs    Right Hand 3 Point Pinch  16 lbs    Left Hand Grip (lbs)  9    Left Hand Lateral Pinch  8 lbs     Left 3 point pinch  6 lbs                      OT Education - 09/23/17 1657    Education provided  Yes    Education Details  educated on HEP for proximal shoulder strengthening, grip strengthening with theraputty and fine motor coordination training     Person(s) Educated  Patient    Methods  Demonstration;Handout    Comprehension  Verbalized understanding;Returned demonstration       OT Short Term Goals - 09/23/17 2121      OT SHORT TERM GOAL #1   Title  Patient will be educated on HEP for LUE strengthening in order to improve functional use of left arm.      Time  4    Period  Weeks    Status  New    Target Date  10/23/17      OT SHORT TERM GOAL #2   Title  Patient will improve left arm strength by 1 muscle grade for improved stability in his left shoulder needed for ADL completion.    Time  4    Period  Weeks    Status  New      OT SHORT TERM GOAL #3   Title  Paitent will improve left grip strength by 10# and pinch strength by 2# in order to improve ability to maintain grasp on ots and pans while cooking.      Time  4    Period  Weeks    Status  New      OT SHORT TERM GOAL #4   Title  Patient will improve left hand coordination by decreasing completion time on nine hole peg test by 3 seconds.      Time  4    Period  Weeks    Status  New      OT SHORT TERM GOAL #5   Title  Patient will decrease left arm pain to 3/10 or less when completing ADLs.    Time  4    Period  Weeks    Status  New        OT Long Term Goals - 09/23/17 2128      OT LONG TERM GOAL #1   Title  Patient will return to prior level of independence with all daily activities utilizing her left arm as an active assit iwth all daily activities.      Time  8    Period  Weeks    Status  New    Target Date  11/22/17      OT LONG TERM GOAL #2   Title  Patient will improve left arm strength to 5/5 for improved functional use of left arm when placing items in overhead cabinets.     Time  8    Period  Weeks    Status  New      OT LONG TERM GOAL #3   Title  Patient will improve left hand grip strength by 40# and pinch strength 8# in order to improve ability to grip cups and cookware.      Time  8    Period  Weeks    Status  New      OT LONG TERM GOAL #4   Title  Patient will improve left hand coordination for improve independence with in hand manipulation and fastening buttons by decreasing completion time on nine hole peg test to 26".      Time  8    Period  Weeks    Status  New      OT LONG TERM GOAL #5   Title  Patient will improve cognition for improved ability to complete executive function tasks independently.      Time  8    Period  Weeks    Status  New            Plan - 09/23/17 2114    Clinical Impression Statement  A:  Patient is a 46 year old with recent TIA (10/12/16), causing decreased strength and coordination in left arm and hand.  Patient is having difficulty utilizing her non dominant left hand to complete activities involving gripping and pinch.  Patient is currently unable to work or drive due to these deficts.  Her family is assisting iwth IADLS that she would complete on her own.     Occupational Profile and client history currently impacting functional performance  age, motivation, severity of deficits    Occupational performance deficits (Please refer to evaluation for details):  ADL's;IADL's;Rest and Sleep;Work;Leisure;Social Participation    Rehab Potential  Good    Current Impairments/barriers affecting progress:  n/a    OT Frequency  2x / week    OT Duration  8 weeks    OT Treatment/Interventions  Self-care/ADL training;Electrical Stimulation;Iontophoresis;Therapeutic exercise;Patient/family education;Neuromuscular education;Moist Heat;Energy conservation;Therapeutic activities;Cognitive remediation/compensation;Passive range of motion;Manual Therapy;Cryotherapy    Plan  P:  SKilled occupational therapy intervention to improve left  arm and hand mobility, strength, and coordination in order to return to prior level of independence using LUE at prior level of function.  Next session begin proximal shoulder strengthening, grip strengthening, and fine motor coordination training.      Clinical Decision Making  Several treatment options, min-mod task modification necessary    Consulted  and Agree with Plan of Care  Patient       Patient will benefit from skilled therapeutic intervention in order to improve the following deficits and impairments:  Decreased cognition, Decreased range of motion, Impaired perceived functional ability, Pain, Impaired UE functional use, Decreased strength, Decreased safety awareness, Decreased coordination  Visit Diagnosis: Other symptoms and signs involving the musculoskeletal system  Other lack of coordination    Problem List Patient Active Problem List   Diagnosis Date Noted  . Sensory disturbance 09/14/2017  . Left hemiparesis (HCC) 09/14/2017  . Dysarthria   . TIA (transient ischemic attack) 09/12/2017  . Obesity, unspecified 11/11/2015  . Essential hypertension, benign 08/12/2015  . Hyperlipidemia 08/12/2015  . Cigarette nicotine dependence, uncomplicated 08/12/2015  . Headache, migraine 08/12/2015    Shirlean Mylar, MHA, OTR/L [phone]  09/23/2017, 9:49 PM  [facility] [address] [place], [place], [address] Phone: [phone]   Fax:  [phone]  Name: KARLI WICKIZER MRN: [id number] Date of Birth: 1971-09-27

## 2017-09-23 NOTE — Patient Instructions (Signed)
Occupational Therapy Exercises  1. Both arms straight out in front of you, keep your elbows straight do not let them bend, the criss/cross your arms in front of you. 2. Both arms straight out in front of you, keep elbows straight.  Bend your wrist back and make small circles in the air.  Go in one direction the go in the other (Like you are waxing something) 3. Both arms straight out in front of you, keep elbows straight.  Bend your wrists back and move your arm up while the left arm goes down as quick as you can. 4. With your right/left arm straight out in front of you, keep your elbow straight.  Write your name in the air 3-4 times quickly. 5. Put both of your hands on your lap with your palms down, turn your palms up and down as fast as you can. 6. Put both of your hands on your lap keeping your wrists on your lap at all times.  No pat on your lap quickly alternating right/left. 7. Put your hands on your lap and drum your fingers.  Make sure your fingers are moving individually. 8. Play with a deck of cards. (Shuffle, deal cards, turn cards over pick up off table one by one)   Do the above exercises at least 3 times a day  Home Exercises Program Theraputty Exercises  Do the following exercises 2-3  times a day using your affected hand.  1. Roll putty into a ball.  2. Make into a pancake.  3. Roll putty into a roll.  4. Pinch along log with first finger and thumb.   5. Make into a ball.  6. Roll it back into a log.   7. Pinch using thumb and side of first finger.  8. Roll into a ball, then flatten into a pancake.  9. Using your fingers, make putty into a mountain.  Coordination Activities  Perform the following activities for 10 minutes 2 times per day with left hand(s).   Rotate ball in fingertips (clockwise and counter-clockwise).  Toss ball between hands.  Toss ball in air and catch with the same hand.  Flip cards 1 at a time as fast as you can.  Deal cards with  your thumb (Hold deck in hand and push card off top with thumb).  Rotate card in hand (clockwise and counter-clockwise).  Shuffle cards.  Pick up coins, buttons, marbles, dried beans/pasta of different sizes and place in container.  Pick up coins and place in container or coin bank.  Pick up coins and stack.  Pick up coins one at a time until you get 5-10 in your hand, then move coins from palm to fingertips to stack one at a time.  Twirl pen between fingers.

## 2017-09-24 ENCOUNTER — Ambulatory Visit (HOSPITAL_COMMUNITY): Payer: Medicaid Other

## 2017-09-27 ENCOUNTER — Telehealth (HOSPITAL_COMMUNITY): Payer: Self-pay

## 2017-09-27 ENCOUNTER — Ambulatory Visit (HOSPITAL_COMMUNITY): Payer: Medicaid Other

## 2017-09-27 NOTE — Telephone Encounter (Signed)
No Show #1: I called Ms. Dominique Harrington at her home phone number on file and spoke to her directly. She apologized for forgetting her appointment today. I reminded her of her next appointment on Thursday 09/30/17 at 11:15 AM and she stated she will write it down and see us on then.  Valentino Saxonachel Quinn-Brown, PT, DPT Physical Therapist with [facility]  09/27/2017 11:39 AM

## 2017-09-30 ENCOUNTER — Ambulatory Visit (HOSPITAL_COMMUNITY): Payer: Medicaid Other | Attending: Family Medicine

## 2017-09-30 ENCOUNTER — Encounter (HOSPITAL_COMMUNITY): Payer: Self-pay

## 2017-09-30 DIAGNOSIS — R262 Difficulty in walking, not elsewhere classified: Secondary | ICD-10-CM

## 2017-09-30 DIAGNOSIS — R278 Other lack of coordination: Secondary | ICD-10-CM | POA: Diagnosis present

## 2017-09-30 DIAGNOSIS — R29898 Other symptoms and signs involving the musculoskeletal system: Secondary | ICD-10-CM | POA: Diagnosis present

## 2017-09-30 DIAGNOSIS — R2681 Unsteadiness on feet: Secondary | ICD-10-CM

## 2017-09-30 DIAGNOSIS — M6281 Muscle weakness (generalized): Secondary | ICD-10-CM | POA: Diagnosis present

## 2017-09-30 NOTE — Patient Instructions (Signed)
Bridge    Lie back, legs bent. Inhale, pressing hips up. Keeping ribs in, lengthen lower back. Exhale, rolling down along spine from top. Repeat 10-15 times. Do 2 sessions per day.  http://pm.exer.us/55   Copyright  VHI. All rights reserved.   Functional Quadriceps: Sit to Stand    Sit on edge of chair, feet flat on floor. Stand upright, extending knees fully. Repeat 10-15  times per set. Do 2 sets per session.  http://orth.exer.us/735   Copyright  VHI. All rights reserved.   Hamstring Curl    Lift arms out from sides for balance. Shift weight onto right leg, while bending left knee. Repeat, switching legs. Repeat  10-15 times.  Copyright  VHI. All rights reserved.   ABDUCTION: Standing (Active)    Stand, feet flat. Lift right leg out to side.  Complete _10-15 sets of 1-2 repetitions.  http://gtsc.exer.us/111   Copyright  VHI. All rights reserved.

## 2017-09-30 NOTE — Therapy (Signed)
[facility] [address] [place], [place], [address] Phone: [phone]   Fax:  [phone]  Physical Therapy Treatment  Patient Details  Name: Dominique Harrington MRN: [id number] Date of Birth: 05-14-1971 Referring Provider: Dr. Standley Dakinslanford Johnson   Encounter Date: 09/30/2017  PT End of Session - 09/30/17 1120    Visit Number  3    Number of Visits  13    Date for PT Re-Evaluation  10/08/17    Authorization Type  Self-pay (waiting on Medicaid approval)    Authorization Time Period  09/17/17 to 10/29/17    PT Start Time  1115    PT Stop Time  1203    PT Time Calculation (min)  48 min    Equipment Utilized During Treatment  Gait belt    Activity Tolerance  Patient limited by fatigue;No increased pain;Patient tolerated treatment well    Behavior During Therapy  [facility] for tasks assessed/performed       Past Medical History:  Diagnosis Date  . Anxiety   . Dyspnea on exertion 2013   Echo, EF =>55%  . Hypertension   . Migraines   . Tachycardia     Past Surgical History:  Procedure Laterality Date  . BACK SURGERY    . COSMETIC SURGERY    . HERNIA REPAIR    . TUBAL LIGATION  2004    There were no vitals filed for this visit.  Subjective Assessment - 09/30/17 1118    Subjective  Pt stated she is feeling good today.  No reoprts of recent fall and no dizzy episodes since last session.  Has been compliant with HEP 2x a day and has began walking with daughter ~30 minutes.  Reports difficulty getting into high bathtub.    Patient Stated Goals  get back to self and lose the cane    Currently in Pain?  No/denies                      [facility] Adult PT Treatment/Exercise - 09/30/17 0001      Ambulation/Gait   Ambulation Distance (Feet)  226 Feet    Assistive device  Straight cane    Gait Pattern  Step-through pattern;Decreased arm swing - left;Decreased step length - right;Decreased stance time - left;Decreased stride  length;Decreased hip/knee flexion - left;Decreased dorsiflexion - left;Decreased weight shift to left;Antalgic;Trendelenburg    Gait Comments  Cueing for sequence      Knee/Hip Exercises: Standing   Heel Raises  2 sets;10 reps    Heel Raises Limitations  Toe raises 10x on incline slope    Knee Flexion  Both;10 reps 2#; Lt LE fatigue at rep 7    Hip Abduction  10 reps    Abduction Limitations  BLE    Functional Squat  10 reps    Functional Squat Limitations  chair behind for good form    SLS  Rt 18", Lt 11" max    Other Standing Knee Exercises  marching 13x (prior fatigue) 3-5" holds with 1 HHA    Other Standing Knee Exercises  sidestep over 6in hurdle 10x; forward step over 6 then 12in hurdles 5x; tandem stance 2x 30" on foam               PT Short Term Goals - 09/17/17 1146      PT SHORT TERM GOAL #1   Title  Pt will be independent with HEP and perform consistently in order to maximize  return to PLOF.    Time  3    Period  Weeks    Status  New    Target Date  10/08/17      PT SHORT TERM GOAL #2   Title  Pt will have 1/2 grade improvement in MMT in order to maximize balance and gait.     Time  3    Period  Weeks    Status  New      PT SHORT TERM GOAL #3   Title  Pt will report being able to cook for 15 mins without requiring a rest break in order to demo improved functional strength and tolerance to standing and walking.     Time  3    Period  Weeks    Status  New        PT Long Term Goals - 09/17/17 1147      PT LONG TERM GOAL #1   Title  Pt will have 1 grade improvment in MMT of all muscle groups tested in order to further promote pt's balance, gait, and return to regular exercise routine.     Time  6    Period  Weeks    Status  New    Target Date  10/29/17      PT LONG TERM GOAL #2   Title  Pt will be able to perform 5xSTS in 20 sec or < with no evidence of unsteadiness, proper mechanics, and no UE support in order to demo improved balance and functional  strength.     Time  6    Period  Weeks    Status  New      PT LONG TERM GOAL #3   Title  Pt will be able to perform SLS on LLE for at least 15 sec to be symmetrical with RLE in order to maximize pt's gait on uneven ground and decrease risk for falls.     Time  6    Period  Weeks    Status  New      PT LONG TERM GOAL #4   Title  Pt will be able ambulate at least 6107ft or > during with LRAD, no pain, and gait WFL, in order to demo improved endurance and functional tolerance to maximize to return to her regular walking program.    Time  6    Period  Weeks    Status  New      PT LONG TERM GOAL #5   Title  Pt will score at least 20/24 on DGI to demo improved dynamic balance and decrease risk for falls.     Time  6    Period  Weeks    Status  New            Plan - 09/30/17 1209    Clinical Impression Statement  Pt progressing well towards session.  Session focus on functional strengthening and balance training.  Added CKC for specific mm strenghtening and functional strengthening activities.  No reports of pain through session, was limited by fatigue required rest breaks throughout the session.  Updated HEP as well as informed pt of brain injury support group in [place], paperwork given.      Rehab Potential  Good    PT Frequency  2x / week    PT Duration  6 weeks    PT Treatment/Interventions  ADLs/Self Care Home Management;Cryotherapy;Electrical Stimulation;Moist Heat;DME Instruction;Gait training;Stair training;Functional mobility training;Therapeutic activities;Therapeutic exercise;Balance training;Neuromuscular re-education;Patient/family education;Manual techniques;Passive  range of motion;Dry needling;Energy conservation;Taping;Splinting    PT Next Visit Plan  Continue functional and core strenghening, gait training with SPC.  Begin rocker board, step up training next session, continue wiht hurdles encourage less HHA, progress as able.      PT Home Exercise Plan  eval:  bridges, s/l clams RTB; STS, hamstring curl and abduction       Patient will benefit from skilled therapeutic intervention in order to improve the following deficits and impairments:  Abnormal gait, Cardiopulmonary status limiting activity, Decreased activity tolerance, Decreased balance, Decreased endurance, Decreased mobility, Decreased strength, Difficulty walking, Impaired sensation, Improper body mechanics, Postural dysfunction, Obesity, Pain  Visit Diagnosis: Other symptoms and signs involving the musculoskeletal system  Other lack of coordination  Muscle weakness (generalized)  Difficulty in walking, not elsewhere classified  Unsteadiness on feet     Problem List Patient Active Problem List   Diagnosis Date Noted  . Sensory disturbance 09/14/2017  . Left hemiparesis (HCC) 09/14/2017  . Dysarthria   . TIA (transient ischemic attack) 09/12/2017  . Obesity, unspecified 11/11/2015  . Essential hypertension, benign 08/12/2015  . Hyperlipidemia 08/12/2015  . Cigarette nicotine dependence, uncomplicated 08/12/2015  . Headache, migraine 08/12/2015   Becky Sax, LPTA; CBIS [phone]  Juel Burrow 09/30/2017, 12:19 PM  [facility] [address] [place], [place], [address] Phone: [phone]   Fax:  [phone]  Name: Dominique Harrington MRN: [id number] Date of Birth: [birth year]

## 2017-10-01 ENCOUNTER — Ambulatory Visit (HOSPITAL_COMMUNITY): Payer: Medicaid Other

## 2017-10-04 ENCOUNTER — Telehealth (HOSPITAL_COMMUNITY): Payer: Self-pay | Admitting: Physician Assistant

## 2017-10-04 ENCOUNTER — Ambulatory Visit (HOSPITAL_COMMUNITY): Payer: Medicaid Other | Admitting: Physical Therapy

## 2017-10-04 ENCOUNTER — Encounter (HOSPITAL_COMMUNITY): Payer: Self-pay

## 2017-10-04 ENCOUNTER — Ambulatory Visit (HOSPITAL_COMMUNITY): Payer: Medicaid Other

## 2017-10-04 NOTE — Telephone Encounter (Signed)
10/04/17  cx because she has a stomach flu

## 2017-10-07 ENCOUNTER — Other Ambulatory Visit: Payer: Self-pay

## 2017-10-07 ENCOUNTER — Ambulatory Visit (HOSPITAL_COMMUNITY): Payer: Medicaid Other | Admitting: Physical Therapy

## 2017-10-07 ENCOUNTER — Telehealth (HOSPITAL_COMMUNITY): Payer: Self-pay | Admitting: Physician Assistant

## 2017-10-07 ENCOUNTER — Emergency Department (HOSPITAL_COMMUNITY)
Admission: EM | Admit: 2017-10-07 | Discharge: 2017-10-07 | Disposition: A | Discharge status: Home / Self Care | Payer: Medicaid Other | Attending: Emergency Medicine | Admitting: Emergency Medicine

## 2017-10-07 ENCOUNTER — Encounter: Payer: Self-pay | Admitting: Physician Assistant

## 2017-10-07 ENCOUNTER — Encounter (HOSPITAL_COMMUNITY): Payer: Self-pay | Admitting: Emergency Medicine

## 2017-10-07 ENCOUNTER — Ambulatory Visit (HOSPITAL_COMMUNITY): Payer: Medicaid Other | Admitting: Occupational Therapy

## 2017-10-07 ENCOUNTER — Ambulatory Visit: Payer: Self-pay | Admitting: Physician Assistant

## 2017-10-07 ENCOUNTER — Emergency Department (HOSPITAL_COMMUNITY): Payer: Medicaid Other

## 2017-10-07 VITALS — BP 122/83 | Temp 97.0°F | Ht 66.5 in | Wt 278.0 lb

## 2017-10-07 DIAGNOSIS — R55 Syncope and collapse: Secondary | ICD-10-CM | POA: Diagnosis present

## 2017-10-07 DIAGNOSIS — I10 Essential (primary) hypertension: Secondary | ICD-10-CM | POA: Insufficient documentation

## 2017-10-07 DIAGNOSIS — Z79899 Other long term (current) drug therapy: Secondary | ICD-10-CM | POA: Insufficient documentation

## 2017-10-07 DIAGNOSIS — F32A Depression, unspecified: Secondary | ICD-10-CM

## 2017-10-07 DIAGNOSIS — F17210 Nicotine dependence, cigarettes, uncomplicated: Secondary | ICD-10-CM | POA: Diagnosis not present

## 2017-10-07 DIAGNOSIS — F329 Major depressive disorder, single episode, unspecified: Secondary | ICD-10-CM

## 2017-10-07 DIAGNOSIS — R42 Dizziness and giddiness: Secondary | ICD-10-CM

## 2017-10-07 DIAGNOSIS — G8194 Hemiplegia, unspecified affecting left nondominant side: Secondary | ICD-10-CM

## 2017-10-07 HISTORY — DX: Transient cerebral ischemic attack, unspecified: G45.9

## 2017-10-07 LAB — URINALYSIS, ROUTINE W REFLEX MICROSCOPIC
BACTERIA UA: NONE SEEN
Bilirubin Urine: NEGATIVE
Glucose, UA: NEGATIVE mg/dL
Hgb urine dipstick: NEGATIVE
Ketones, ur: NEGATIVE mg/dL
Nitrite: NEGATIVE
PH: 5 (ref 5.0–8.0)
Protein, ur: NEGATIVE mg/dL
SPECIFIC GRAVITY, URINE: 1.012 (ref 1.005–1.030)

## 2017-10-07 LAB — CBC WITH DIFFERENTIAL/PLATELET
Basophils Absolute: 0.1 10*3/uL (ref 0.0–0.1)
Basophils Relative: 1 %
EOS PCT: 4 %
Eosinophils Absolute: 0.4 10*3/uL (ref 0.0–0.7)
HCT: 42.4 % (ref 36.0–46.0)
Hemoglobin: 13.9 g/dL (ref 12.0–15.0)
LYMPHS ABS: 3.1 10*3/uL (ref 0.7–4.0)
LYMPHS PCT: 38 %
MCH: 31 pg (ref 26.0–34.0)
MCHC: 32.8 g/dL (ref 30.0–36.0)
MCV: 94.4 fL (ref 78.0–100.0)
MONOS PCT: 6 %
Monocytes Absolute: 0.5 10*3/uL (ref 0.1–1.0)
Neutro Abs: 4.3 10*3/uL (ref 1.7–7.7)
Neutrophils Relative %: 51 %
PLATELETS: 218 10*3/uL (ref 150–400)
RBC: 4.49 MIL/uL (ref 3.87–5.11)
RDW: 14.3 % (ref 11.5–15.5)
WBC: 8.3 10*3/uL (ref 4.0–10.5)

## 2017-10-07 LAB — COMPREHENSIVE METABOLIC PANEL
ALBUMIN: 4.1 g/dL (ref 3.5–5.0)
ALT: 18 U/L (ref 14–54)
AST: 27 U/L (ref 15–41)
Alkaline Phosphatase: 104 U/L (ref 38–126)
Anion gap: 13 (ref 5–15)
BUN: 15 mg/dL (ref 6–20)
CHLORIDE: 101 mmol/L (ref 101–111)
CO2: 21 mmol/L — AB (ref 22–32)
CREATININE: 0.97 mg/dL (ref 0.44–1.00)
Calcium: 9.9 mg/dL (ref 8.9–10.3)
GFR calc Af Amer: >60 mL/min (ref >60)
GFR calc non Af Amer: >60 mL/min (ref >60)
GLUCOSE: 104 mg/dL — AB (ref 65–99)
POTASSIUM: 4 mmol/L (ref 3.5–5.1)
SODIUM: 135 mmol/L (ref 135–145)
Total Bilirubin: 0.3 mg/dL (ref 0.3–1.2)
Total Protein: 7.3 g/dL (ref 6.5–8.1)

## 2017-10-07 LAB — CBG MONITORING, ED: GLUCOSE-CAPILLARY: 97 mg/dL (ref 65–99)

## 2017-10-07 LAB — PREGNANCY, URINE: PREG TEST UR: NEGATIVE

## 2017-10-07 MED ORDER — SODIUM CHLORIDE 0.9 % IV BOLUS (SEPSIS)
500.0000 mL | Freq: Once | INTRAVENOUS | Status: AC
  Administered 2017-10-07: 500 mL via INTRAVENOUS

## 2017-10-07 MED ORDER — DIPHENHYDRAMINE HCL 50 MG/ML IJ SOLN
50.0000 mg | Freq: Once | INTRAMUSCULAR | Status: AC
Start: 2017-10-07 — End: 2017-10-07
  Administered 2017-10-07: 50 mg via INTRAVENOUS
  Filled 2017-10-07: qty 1

## 2017-10-07 MED ORDER — KETOROLAC TROMETHAMINE 30 MG/ML IJ SOLN
30.0000 mg | Freq: Once | INTRAMUSCULAR | Status: AC
  Administered 2017-10-07: 30 mg via INTRAVENOUS
  Filled 2017-10-07: qty 1

## 2017-10-07 MED ORDER — SODIUM CHLORIDE 0.9 % IV BOLUS (SEPSIS)
1000.0000 mL | Freq: Once | INTRAVENOUS | Status: AC
  Administered 2017-10-07: 1000 mL via INTRAVENOUS

## 2017-10-07 MED ORDER — METOCLOPRAMIDE HCL 5 MG/ML IJ SOLN
10.0000 mg | Freq: Once | INTRAMUSCULAR | Status: AC
  Administered 2017-10-07: 10 mg via INTRAVENOUS
  Filled 2017-10-07: qty 2

## 2017-10-07 NOTE — Progress Notes (Signed)
Temp (!) 97 F (36.1 C)   Ht 5' 6.5" (1.689 m)   Wt 278 lb (126.1 kg)   LMP 12/19/2014   BMI 44.20 kg/m    Subjective:    Patient ID: Dominique Harrington, female    DOB: Apr 20, 1971, 47 y.o.   MRN: [id number]  HPI: Dominique HasteCarrie P Molinelli is a 47 y.o. female presenting on 10/07/2017 for Dizziness and Hypotension   HPI  Pt here complaining of worsening dizziness.    Pt had stomach bug that started last Thursday.   She started feeling better Tuesday afternoon. She had diarrhea, vomiting, nausea and a little bit congested.  All GI symptoms are resolved      She fell last night.  No injuy.  After her last OV and before she got sick, she says the she still had some dizziness but it wasn't much after her last OV until she got the stomach bug.  She says the dizziness is really bad now.   Pt says her bp was 78/71 on Tuesday.    [facility] with what diagnosis of "completely resolved" "dysarthria/sensory disturbance/left hemiparesis".  Pt did not have complete resolution of her weakness and has been using a cane since discharge from the hospital.  She says that she was improving with the physical therapy that she has been getting.  It was arranged for pt to follow up with neurology but her appt is not until April.  Relevant past medical, surgical, family and social history reviewed and updated as indicated. Interim medical history since our last visit reviewed. Allergies and medications reviewed and updated.   Current Outpatient Medications:  .  aspirin 81 MG chewable tablet, Chew 1 tablet (81 mg total) by mouth daily., Disp: 30 tablet, Rfl:  .  atorvastatin (LIPITOR) 20 MG tablet, Take 1 tablet (20 mg total) by mouth daily., Disp: 90 tablet, Rfl: 3 .  lisinopril-hydrochlorothiazide (ZESTORETIC) 20-12.5 MG tablet, Take 1 tablet by mouth daily., Disp: 1 tablet, Rfl: 00 .  Omega-3 Fatty Acids (FISH OIL PO), Take 2,000 mg by mouth 2 (two) times daily. , Disp: , Rfl:     Review of Systems  Constitutional: Positive for appetite change and fatigue. Negative for chills, diaphoresis, fever and unexpected weight change.  HENT: Positive for dental problem. Negative for congestion, drooling, ear pain, facial swelling, hearing loss, mouth sores, sneezing, sore throat, trouble swallowing and voice change.   Eyes: Negative for pain, discharge, redness, itching and visual disturbance.  Respiratory: Negative for cough, choking, shortness of breath and wheezing.   Cardiovascular: Negative for chest pain, palpitations and leg swelling.  Gastrointestinal: Positive for diarrhea (resolved) and vomiting (resolved). Negative for abdominal pain, blood in stool and constipation.  Endocrine: Positive for polydipsia. Negative for cold intolerance and heat intolerance.  Genitourinary: Negative for decreased urine volume, dysuria and hematuria.  Musculoskeletal: Positive for gait problem. Negative for arthralgias and back pain.  Skin: Negative for rash.  Allergic/Immunologic: Negative for environmental allergies.  Neurological: Positive for light-headedness and headaches. Negative for seizures and syncope.  Hematological: Negative for adenopathy.  Psychiatric/Behavioral: Positive for dysphoric mood. Negative for agitation and suicidal ideas. The patient is nervous/anxious.     Per HPI unless specifically indicated above     Objective:    Temp (!) 97 F (36.1 C)   Ht 5' 6.5" (1.689 m)   Wt 278 lb (126.1 kg)   LMP 12/19/2014   BMI 44.20 kg/m   Wt Readings from  Last 3 Encounters:  10/07/17 278 lb (126.1 kg)  09/12/17 283 lb 1.1 oz (128.4 kg)  09/01/17 277 lb (125.6 kg)    Orthostatic VS for the past 24 hrs:  BP- Lying Pulse- Lying BP- Sitting Pulse- Sitting BP- Standing at 0 minutes Pulse- Standing at 0 minutes  10/07/17 1201 122/88 98 122/83 99 130/83 99      Physical Exam  Constitutional: She is oriented to person, place, and time. She appears well-developed  and well-nourished. She is cooperative.  HENT:  Head: Normocephalic and atraumatic.  Neck: Neck supple.  Cardiovascular: Normal rate and regular rhythm.  Pulmonary/Chest: Effort normal and breath sounds normal.  Abdominal: Soft. Bowel sounds are normal. She exhibits no distension and no mass. There is no tenderness. There is no rebound and no guarding.  Lymphadenopathy:    She has no cervical adenopathy.  Neurological: She is alert and oriented to person, place, and time. Gait abnormal.  Very dizzy.  Pt became unable to sit upright to complete exam.   Skin: Skin is warm, dry and intact.  Psychiatric: Her speech is normal. Thought content normal. She exhibits a depressed mood.  Pt tearful at times  Nursing note and vitals reviewed.  Pt unable to sit upright for to complete exam.  She was assisted into wheelchair and escorted with her husband to their car where assistance was provided for her to get into her car.     Assessment & Plan:    Encounter Diagnoses  Name Primary?  . Dizziness Yes  . Depression, unspecified depression type   . Hemiparesis of left nondominant side, unspecified hemiparesis etiology (HCC)     -Pt needs further evaluation and possibly imaging.  She was sent to ER.  offered to call EMS but she declined.  Her husband will take her. -pt to call next week to schedule follow up appt

## 2017-10-07 NOTE — ED Triage Notes (Signed)
Patient was being seen at PCP for increased dizziness, changes in blood pressure, and weakness in right arm intermittently x2 weeks. Patient had LOC lasting approx 2 minutes. Patient having physical therapy for weakness to left side due to TIA before Christmas. Patient states headache after syncopal episode and is nauseated.

## 2017-10-07 NOTE — ED Provider Notes (Signed)
Emergency Department Provider Note   I have reviewed the triage vital signs and the nursing notes.   HISTORY  Chief Complaint Loss of Consciousness   HPI Dominique Harrington is a 47 y.o. female with PMH of TIA and HTN since to the emergency department after experiencing a syncopal episode in the primary care physician's office.  Patient states that her blood pressures have been up and down over the last 2 weeks.  She had many of her medications stopped after her recent TIA and states that initially she been doing well but now her blood pressure is going up and down she feels lightheaded often.  No provoking or modifying factors.  She denies any vomiting or diarrhea.  Today she was having some nausea prior to her doctor's appointment but was feeling okay.  While in the room with the physician she felt very lightheaded all of a sudden along with nausea and then had a witnessed syncopal episode.  No seizure activity.  Patient was seated and did not fall to the ground or strike her head.  She states that when she regained consciousness she felt like she was getting a migraine headache, which she has had in the past.  She states the symptoms are familiar to her and denies any sudden onset, maximal intensity headache symptoms.  She denies any chest pain, palpitations, shortness of breath prior to the syncopal event.   Past Medical History:  Diagnosis Date  . Anxiety   . Dyspnea on exertion 2013   Echo, EF =>55%  . Hypertension   . Migraines   . Tachycardia   . TIA (transient ischemic attack)     Patient Active Problem List   Diagnosis Date Noted  . Sensory disturbance 09/14/2017  . Left hemiparesis (HCC) 09/14/2017  . Dysarthria   . TIA (transient ischemic attack) 09/12/2017  . Obesity, unspecified 11/11/2015  . Essential hypertension, benign 08/12/2015  . Hyperlipidemia 08/12/2015  . Cigarette nicotine dependence, uncomplicated 08/12/2015  . Headache, migraine 08/12/2015    Past  Surgical History:  Procedure Laterality Date  . BACK SURGERY    . COSMETIC SURGERY    . HERNIA REPAIR    . TUBAL LIGATION  2004    Current Outpatient Rx  . Order #: [id number] Class: Historical Med  . Order #: [id number] Class: Print  . Order #: [id number] Class: Historical Med  . Order #: [id number] Class: Historical Med  . Order #: [id number] Class: Historical Med    Allergies Sulfa antibiotics  Family History  Problem Relation Age of Onset  . Heart disease Father     Social History Social History   Tobacco Use  . Smoking status: Current Every Day Smoker    Packs/day: 0.50    Years: 26.00    Pack years: 13.00    Types: Cigarettes  . Smokeless tobacco: Never Used  Substance Use Topics  . Alcohol use: No  . Drug use: No    Review of Systems  Constitutional: No fever/chills Eyes: No visual changes. ENT: No sore throat. Cardiovascular: Denies chest pain. Positive syncope.  Respiratory: Denies shortness of breath. Gastrointestinal: No abdominal pain. Positive nausea, no vomiting.  No diarrhea.  No constipation. Genitourinary: Negative for dysuria. Musculoskeletal: Negative for back pain. Skin: Negative for rash. Neurological: Negative for focal weakness or numbness. Positive migraine HA.   10-point ROS otherwise negative.  ____________________________________________   PHYSICAL EXAM:  VITAL SIGNS: ED Triage Vitals  Enc Vitals Group     BP 10/07/17 1240 113/76  Pulse Rate 10/07/17 1240 93     Resp 10/07/17 1240 14     Temp 10/07/17 1240 97.9 F (36.6 C)     Temp Source 10/07/17 1240 Oral     SpO2 10/07/17 1240 99 %     Weight 10/07/17 1241 278 lb (126.1 kg)     Height 10/07/17 1241 5' 6.5" (1.689 m)     Pain Score 10/07/17 1240 8   Constitutional: Alert and oriented. Well appearing and in no acute distress. Eyes: Conjunctivae are normal.  Head: Atraumatic. Nose: No congestion/rhinnorhea. Mouth/Throat: Mucous membranes are moist.  Oropharynx  non-erythematous. Neck: No stridor.  Cardiovascular: Normal rate, regular rhythm. Good peripheral circulation. Grossly normal heart sounds.   Respiratory: Normal respiratory effort.  No retractions. Lungs CTAB. Gastrointestinal: Soft and nontender. No distention.  Musculoskeletal: No lower extremity tenderness nor edema. No gross deformities of extremities. Neurologic:  Normal speech and language. 4+/5 strength in the LUE and LLE.   Skin:  Skin is warm, dry and intact. No rash noted.  ____________________________________________   LABS (all labs ordered are listed, but only abnormal results are displayed)  Labs Reviewed  URINALYSIS, ROUTINE W REFLEX MICROSCOPIC - Abnormal; Notable for the following components:      Result Value   APPearance CLOUDY (*)    Leukocytes, UA TRACE (*)    Squamous Epithelial / LPF 6-30 (*)    All other components within normal limits  COMPREHENSIVE METABOLIC PANEL - Abnormal; Notable for the following components:   CO2 21 (*)    Glucose, Bld 104 (*)    All other components within normal limits  PREGNANCY, URINE  CBC WITH DIFFERENTIAL/PLATELET  CBG MONITORING, ED   ____________________________________________  EKG   EKG Interpretation  Date/Time:  Thursday October 07 2017 12:39:05 EST Ventricular Rate:  96 PR Interval:    QRS Duration: 97 QT Interval:  364 QTC Calculation: 460 R Axis:   26 Text Interpretation:  Sinus rhythm Low voltage, precordial leads No STEMI.  Confirmed by Alona Bene [phone]) on 10/07/2017 12:53:11 PM       ____________________________________________  RADIOLOGY  Ct Head Wo Contrast  Result Date: 10/07/2017 CLINICAL DATA:  Increased dizziness, change in blood pressure, and weakness. EXAM: CT HEAD WITHOUT CONTRAST TECHNIQUE: Contiguous axial images were obtained from the base of the skull through the vertex without intravenous contrast. COMPARISON:  None. FINDINGS: Brain: No evidence for acute infarction, hemorrhage,  mass lesion, hydrocephalus, or extra-axial fluid. Normal cerebral volume. No white matter disease. Vascular: No hyperdense vessel. Skull: Negative. Sinuses/Orbits: No fluid accumulation.  No orbital mass. Other: None. IMPRESSION: Negative exam. Electronically Signed   By: Elsie Stain M.D.   On: 10/07/2017 13:31    ____________________________________________   PROCEDURES  Procedure(s) performed:   Procedures  None ____________________________________________   INITIAL IMPRESSION / ASSESSMENT AND PLAN / ED COURSE  Pertinent labs & imaging results that were available during my care of the patient were reviewed by me and considered in my medical decision making (see chart for details).  Patient presents to the emergency department for evaluation of nausea with syncope event followed by migraine type headache symptoms.  The triage note from the nurse describes right arm weakness intermittently but the patient states that her weakness is always been on the left to me.  No sudden onset, maximal intensity headache symptoms to increase my suspicion for subarachnoid hemorrhage.  Patient states that she is feeling the beginning symptoms of a migraine headache.  No obvious provoking factors  for the syncope.  EKG is reassuring.  Patient's vital signs are largely unremarkable.  Plan for CT imaging of the head as the patient states to me she has had several falls recently because of her left-sided weakness with hopes of ruling out subdural hemorrhage from remote fall.  Plan for IV fluids and treatment of migraine headache symptoms. Will add Toradol after normal CT head.   CT negative. Labs reassuring. Patient feeling better after IVF. Patient monitored in the ED with no arrhythmia. Some orthostatic hypotension initially. Symptoms improved and patient ambulatory in the ED without difficulty. Plan for PCP and Cardiology follow up as an outpatient.   At this time, I do not feel there is any  life-threatening condition present. I have reviewed and discussed all results (EKG, imaging, lab, urine as appropriate), exam findings with patient. I have reviewed nursing notes and appropriate previous records.  I feel the patient is safe to be discharged home without further emergent workup. Discussed usual and customary return precautions. Patient and family (if present) verbalize understanding and are comfortable with this plan.  Patient will follow-up with their primary care provider. If they do not have a primary care provider, information for follow-up has been provided to them. All questions have been answered.  ____________________________________________  FINAL CLINICAL IMPRESSION(S) / ED DIAGNOSES  Final diagnoses:  Syncope and collapse     MEDICATIONS GIVEN DURING THIS VISIT:  Medications  sodium chloride 0.9 % bolus 500 mL (0 mLs Intravenous Stopped 10/07/17 1351)  metoCLOPramide (REGLAN) injection 10 mg (10 mg Intravenous Given 10/07/17 1308)  diphenhydrAMINE (BENADRYL) injection 50 mg (50 mg Intravenous Given 10/07/17 1308)  ketorolac (TORADOL) 30 MG/ML injection 30 mg (30 mg Intravenous Given 10/07/17 1352)  sodium chloride 0.9 % bolus 1,000 mL (1,000 mLs Intravenous New Bag/Given 10/07/17 1432)    Note:  This document was prepared using Dragon voice recognition software and may include unintentional dictation errors.  Alona Bene, MD Emergency Medicine    Long, Arlyss Repress, MD 10/07/17 [phone]

## 2017-10-07 NOTE — Telephone Encounter (Signed)
10/07/17  [facility] the patient wouldn't be coming here they saw her today and she was sent to the E

## 2017-10-07 NOTE — Discharge Instructions (Signed)
You were seen in the ED today after passing out. You improved with IV fluids and your labs and CT scan were normal. You may benefit from seeing your Cardiologist for further evaluation in the clinic. Drink plenty of fluids at home and call your PCP for follow up there as well. Return to the Emergency Department with any additional episodes of passing out, chest pain, difficulty breathing, or other concerning symptoms.

## 2017-10-11 ENCOUNTER — Ambulatory Visit (HOSPITAL_COMMUNITY): Payer: Medicaid Other

## 2017-10-11 ENCOUNTER — Ambulatory Visit (HOSPITAL_COMMUNITY): Payer: Medicaid Other | Admitting: Specialist

## 2017-10-11 ENCOUNTER — Telehealth (HOSPITAL_COMMUNITY): Payer: Self-pay
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
## 2017-10-12 ENCOUNTER — Ambulatory Visit: Payer: Self-pay | Admitting: Physician Assistant

## 2017-10-12 ENCOUNTER — Telehealth: Payer: Self-pay | Admitting: Student

## 2017-10-12 NOTE — Telephone Encounter (Signed)
Pt called back and LPN let patient know the office was concerned for her since she didn't make her appointment today (pt usually compliant with appointments), and since she wasn't doing do good at her last OV. Pt states she thought her appointment was Thursday 10-14-17. Pt was then r/s for tomorrow 10-13-17 at [facility]

## 2017-10-12 NOTE — Telephone Encounter (Signed)
Called to check on patient since she no showed to her appointment today. Left voicemail for patient to call back.

## 2017-10-13 ENCOUNTER — Encounter: Payer: Self-pay | Admitting: Physician Assistant

## 2017-10-13 ENCOUNTER — Ambulatory Visit: Payer: Self-pay | Admitting: Physician Assistant

## 2017-10-13 VITALS — BP 116/76 | HR 95 | Temp 97.7°F | Ht 66.5 in

## 2017-10-13 DIAGNOSIS — F17210 Nicotine dependence, cigarettes, uncomplicated: Secondary | ICD-10-CM

## 2017-10-13 DIAGNOSIS — F419 Anxiety disorder, unspecified: Secondary | ICD-10-CM

## 2017-10-13 DIAGNOSIS — F32A Depression, unspecified: Secondary | ICD-10-CM

## 2017-10-13 DIAGNOSIS — G8194 Hemiplegia, unspecified affecting left nondominant side: Secondary | ICD-10-CM

## 2017-10-13 DIAGNOSIS — I10 Essential (primary) hypertension: Secondary | ICD-10-CM

## 2017-10-13 DIAGNOSIS — F329 Major depressive disorder, single episode, unspecified: Secondary | ICD-10-CM

## 2017-10-13 DIAGNOSIS — E785 Hyperlipidemia, unspecified: Secondary | ICD-10-CM

## 2017-10-13 NOTE — Patient Instructions (Signed)
Financial counselor - [phone] Check dates cone discount good for

## 2017-10-13 NOTE — Progress Notes (Signed)
BP 116/76 (BP Location: Left Arm, Patient Position: Sitting, Cuff Size: Large)   Pulse 95   Temp 97.7 F (36.5 C) (Other (Comment))   Ht 5' 6.5" (1.689 m)   LMP 12/19/2014   SpO2 99%   BMI 44.20 kg/m    Subjective:    Patient ID: Dominique Harrington, female    DOB: November 16, 1970, 47 y.o.   MRN: [id number]  HPI: Dominique Harrington is a 47 y.o. female presenting on 10/13/2017 for Follow-up (states is still having intermittent moderate episodes of dizziness, has fallen a few times since last visit, and still has frontal headaches)   HPI   Chief Complaint  Patient presents with  . Follow-up    states is still having intermittent moderate episodes of dizziness, has fallen a few times since last visit, and still has frontal headaches    Pt is still having dizziness and is falling.  She says the dizziness comes and goes.  She says the dizziness lasts several minutes when it comes.   Says the episodes of dizziness come every few hours  Pt was seen here last Monday and was sent to the ER due to significant dizziness- pt had been unable to sit up.  Pt did not have syncopal episode in the office.   Pt had CT head in the ER which was read as negative.  She has had constant headache but says it has not been a migraine.  She is using IBU for this.   Pt is getting PT.  Her appt with neurologist isn't until April.   Pt says she has appt with [facility] in 2 weeks to help with her mood which has been frustrated, depressed and anxious.  This is due to her helpless feelings of not being able to care for herself since her " Dysarthria/sensory disturbance/left hemiparesis" which was "Completely resolved" (per her hospital discharge summary) but is obviously not.   Pt has [employer] discount but doesn't know when it is good until  Relevant past medical, surgical, family and social history reviewed and updated as indicated. Interim medical history since our last visit reviewed. Allergies and medications reviewed and  updated.   Current Outpatient Medications:  .  aspirin EC 81 MG tablet, Take 81 mg by mouth daily., Disp: , Rfl:  .  atorvastatin (LIPITOR) 20 MG tablet, Take 1 tablet (20 mg total) by mouth daily. (Patient taking differently: Take 20 mg by mouth at bedtime. ), Disp: 90 tablet, Rfl: 3 .  ibuprofen (ADVIL,MOTRIN) 200 MG tablet, Take 400-800 mg by mouth every 6 (six) hours as needed for headache., Disp: , Rfl:  .  lisinopril-hydrochlorothiazide (ZESTORETIC) 20-12.5 MG tablet, Take 1 tablet by mouth daily., Disp: 1 tablet, Rfl: 00 .  Omega-3 Fatty Acids (FISH OIL PO), Take 2,000 mg by mouth 2 (two) times daily. , Disp: , Rfl:    Review of Systems  Constitutional: Positive for appetite change and fatigue. Negative for chills, diaphoresis, fever and unexpected weight change.  HENT: Negative for congestion, drooling, ear pain, facial swelling, hearing loss, mouth sores, sneezing, sore throat, trouble swallowing and voice change.   Eyes: Negative for pain, discharge, redness, itching and visual disturbance.  Respiratory: Negative for cough, choking, shortness of breath and wheezing.   Cardiovascular: Positive for leg swelling. Negative for chest pain and palpitations.  Gastrointestinal: Negative for abdominal pain, blood in stool, constipation, diarrhea and vomiting.  Endocrine: Positive for polydipsia. Negative for cold intolerance and heat intolerance.  Genitourinary: Negative for  decreased urine volume, dysuria and hematuria.  Musculoskeletal: Positive for gait problem. Negative for arthralgias and back pain.  Skin: Negative for rash.  Allergic/Immunologic: Negative for environmental allergies.  Neurological: Positive for light-headedness and headaches. Negative for seizures and syncope.  Hematological: Negative for adenopathy.  Psychiatric/Behavioral: Positive for agitation and dysphoric mood. Negative for suicidal ideas. The patient is nervous/anxious.     Per HPI unless specifically  indicated above     Objective:    BP 116/76 (BP Location: Left Arm, Patient Position: Sitting, Cuff Size: Large)   Pulse 95   Temp 97.7 F (36.5 C) (Other (Comment))   Ht 5' 6.5" (1.689 m)   LMP 12/19/2014   SpO2 99%   BMI 44.20 kg/m   Wt Readings from Last 3 Encounters:  10/07/17 278 lb (126.1 kg)  10/07/17 278 lb (126.1 kg)  09/12/17 283 lb 1.1 oz (128.4 kg)    Physical Exam  Constitutional: She is oriented to person, place, and time. She appears well-developed and well-nourished.  HENT:  Head: Normocephalic and atraumatic.  Eyes: Conjunctivae and EOM are normal. Pupils are equal, round, and reactive to light.  Neck: Neck supple.  Cardiovascular: Normal rate and regular rhythm.  Pulmonary/Chest: Effort normal and breath sounds normal.  Abdominal: Soft. Bowel sounds are normal. She exhibits no mass. There is no hepatosplenomegaly. There is no tenderness.  Musculoskeletal: She exhibits no edema.  Lymphadenopathy:    She has no cervical adenopathy.  Neurological: She is alert and oriented to person, place, and time. No cranial nerve deficit. Coordination and gait abnormal.  Pt has obvious weakness LUE and LLE.  The UE weakness is more noticeable.  Pt is using a cane to walk and is having a lot of difficulty with ambulation.  She is not dizzy and falling over like she was at OV last week.     Skin: Skin is warm and dry.  Psychiatric: Her behavior is normal. Judgment and thought content normal. She exhibits a depressed mood.  Pt is still depressed but seems less so than she was at OV last week.  She is not tearful today.   Vitals reviewed.       Assessment & Plan:    Encounter Diagnoses  Name Primary?  . Hemiparesis of left nondominant side, unspecified hemiparesis etiology (HCC) Yes  . Essential hypertension, benign   . Hyperlipidemia, unspecified hyperlipidemia type   . Cigarette nicotine dependence, uncomplicated   . Depression, unspecified depression type   .  Anxiety   . Morbid obesity (HCC)      -nurse called neurology office to inquire about moving appt up and was told nothing sooner is available.  Pt was put on their cancellation list.  -pt to continue physical therapy -pt to continue current medications -pt to find out when her [facility] discount expires so she can renew it when needed -pt counseled on smoking cessation -pt to continue with [facility] for depression -pt to follow up in 3 weeks for recheck.  She is to RTO sooner prn worsening or new symptoms

## 2017-10-14 ENCOUNTER — Ambulatory Visit (HOSPITAL_COMMUNITY): Payer: Medicaid Other

## 2017-10-14 ENCOUNTER — Encounter (HOSPITAL_COMMUNITY): Payer: Self-pay | Admitting: Occupational Therapy

## 2017-10-14 ENCOUNTER — Ambulatory Visit (HOSPITAL_COMMUNITY): Payer: Medicaid Other | Admitting: Occupational Therapy

## 2017-10-14 ENCOUNTER — Encounter (HOSPITAL_COMMUNITY): Payer: Self-pay

## 2017-10-14 ENCOUNTER — Ambulatory Visit
Admission: RE | Admit: 2017-10-14 | Discharge: 2017-10-14 | Disposition: A | Discharge status: Home / Self Care | Payer: No Typology Code available for payment source | Source: Ambulatory Visit | Attending: Physician Assistant | Admitting: Physician Assistant

## 2017-10-14 ENCOUNTER — Other Ambulatory Visit: Payer: Self-pay

## 2017-10-14 VITALS — BP 126/82

## 2017-10-14 DIAGNOSIS — R2681 Unsteadiness on feet: Secondary | ICD-10-CM

## 2017-10-14 DIAGNOSIS — R262 Difficulty in walking, not elsewhere classified: Secondary | ICD-10-CM

## 2017-10-14 DIAGNOSIS — Z1231 Encounter for screening mammogram for malignant neoplasm of breast: Secondary | ICD-10-CM

## 2017-10-14 DIAGNOSIS — M6281 Muscle weakness (generalized): Secondary | ICD-10-CM

## 2017-10-14 DIAGNOSIS — R29898 Other symptoms and signs involving the musculoskeletal system: Secondary | ICD-10-CM

## 2017-10-14 DIAGNOSIS — R278 Other lack of coordination: Secondary | ICD-10-CM

## 2017-10-14 NOTE — Therapy (Signed)
[place], [place], [address] Phone: [phone]   Fax:  [phone]  Physical Therapy Treatment/Re-Assessment  Patient Details  Name: Dominique Harrington MRN: [id number] Date of Birth: [birth year] Referring Provider: Orson Eva, MD   Encounter Date: 10/14/2017  PT End of Session - 10/14/17 0906    Visit Number  4    Number of Visits  13    Date for PT Re-Evaluation  10/08/17    Authorization Type  Self-pay (waiting on Medicaid approval)    Authorization Time Period  09/17/17 to 10/29/17    PT Start Time  0906    PT Stop Time  0945    PT Time Calculation (min)  39 min    Equipment Utilized During Treatment  Gait belt    Activity Tolerance  Patient limited by fatigue;No increased pain;Patient tolerated treatment well    Behavior During Therapy  [facility] for tasks assessed/performed       Past Medical History:  Diagnosis Date  . Anxiety   . Dyspnea on exertion 2013   Echo, EF =>55%  . Hypertension   . Migraines   . Tachycardia   . TIA (transient ischemic attack)     Past Surgical History:  Procedure Laterality Date  . BACK SURGERY    . COSMETIC SURGERY    . HERNIA REPAIR    . TUBAL LIGATION  2004    Vitals:   10/14/17 0929  BP: 126/82    Subjective Assessment - 10/14/17 0909    Subjective  Patient reports she passed out in MD office at her follow-up and that she has fallen 6/7 of the last days. She states she starts to feel dizzy and then falls while walking. She wasn't scheduled for her neurologist appointment until April, however due to worsening of dizziness and falls Dr. Carles Collet called and had her moved to the priority list for the earliest available appointment. She denies hitting her head or LOC during the falls. She reports she can get herself up from the floor after she falls sometimes but her husband or daughters usually help her up. She reports feeling scared and anxious about falling and gets anxious  whenever she starts feeling dizzy.    Limitations  Walking;House hold activities    How long can you sit comfortably?  no issues    How long can you stand comfortably?  30-45 mins    How long can you walk comfortably?  2x 30 minutes for daily activities with no problem    Patient Stated Goals  get back to self and lose the cane    Currently in Pain?  No/denies         [facility] PT Assessment - 10/14/17 0001      Assessment   Medical Diagnosis  TIA with left upper extremity weaknes    Referring Provider  Orson Eva, MD    Onset Date/Surgical Date  09/12/17    Next MD Visit  11/03/17  last appointment 10/13/17    Prior Therapy  none for current situation; 2002 for sciatica      Precautions   Precautions  Fall      Balance Screen   Has the patient fallen in the past 6 months  Yes    How many times?  6/7 days this past week    Has the patient had a decrease in activity level because of a fear of falling?   No    Is the  patient reluctant to leave their home because of a fear of falling?   Yes      Observation/Other Assessments   Focus on Therapeutic Outcomes (FOTO)   39% limited 57% limited 09/23/17      Strength   Right Hip Flexion  5/5    Right Hip Extension  5/5    Right Hip ABduction  4+/5    Left Hip Flexion  4/5    Left Hip Extension  4+/5 was 4-/5 09/17/17    Left Hip ABduction  4+/5 was 4/5 09/17/17    Right Knee Flexion  5/5    Right Knee Extension  5/5    Left Knee Flexion  4+/5    Left Knee Extension  4+/5    Right Ankle Dorsiflexion  5/5    Left Ankle Dorsiflexion  5/5 was 4/5 09/17/17      Ambulation/Gait   Ambulation Distance (Feet)  482 Feet    Assistive device  Straight cane    Gait Pattern  Step-through pattern;Decreased hip/knee flexion - left;Trendelenburg    Ambulation Surface  Level    Gait velocity  0.92 m/s    Gait Comments  patient performing 3MWT. Stopped at 2:40 secondary to dizziness and fear of falling      Standardized Balance Assessment    Standardized Balance Assessment  Timed Up and Go Test    Five times sit to stand comments   12 seconds, no UE support, no overt LOB      Dynamic Gait Index   DGI comment:  unable to test today due to patient's dizziness      Timed Up and Go Test   Normal TUG (seconds)  20       [facility] Adult PT Treatment/Exercise - 10/14/17 0001      Knee/Hip Exercises: Seated   Marching Limitations  10x BLE, hold for 3-5 seconds       PT Education - 10/14/17 1305    Education provided  Yes    Education Details  Educated on progress towards goals and on updated HEP for remaining muscle weakness. Educated on symptoms after a stroke and emotional changes that can occur depending on area of brain affected to let patient know it is not something to be upset with herself for.     Person(s) Educated  Patient    Methods  Handout;Explanation    Comprehension  Verbalized understanding;Returned demonstration       PT Short Term Goals - 10/14/17 1309      PT SHORT TERM GOAL #1   Title  Pt will be independent with HEP and perform consistently in order to maximize return to PLOF.    Baseline  10/14/17 - patient report    Time  3    Period  Weeks    Status  Achieved      PT SHORT TERM GOAL #2   Title  Pt will have 1/2 grade improvement in MMT in order to maximize balance and gait.     Baseline  10/14/17 - some groups tested improved by 1/2 grade    Time  3    Period  Weeks    Status  Partially Met      PT SHORT TERM GOAL #3   Title  Pt will report being able to cook for 15 mins without requiring a rest break in order to demo improved functional strength and tolerance to standing and walking.     Baseline  10/14/17 - inconsistant report of  performance. Patient can parform activities for 30 minutes at times and on other occassions cannot perform upright activities for more than a couple minutes due to dizziness.    Time  3    Period  Weeks    Status  Partially Met        PT Long Term Goals - 10/14/17  1311      PT LONG TERM GOAL #1   Title  Pt will have 1 grade improvment in MMT of all muscle groups tested in order to further promote pt's balance, gait, and return to regular exercise routine.     Baseline  10/14/17 - some groups tested improved by 1/2 grade    Time  6    Period  Weeks    Status  On-going      PT LONG TERM GOAL #2   Title  Pt will be able to perform 5xSTS in 20 sec or < with no evidence of unsteadiness, proper mechanics, and no UE support in order to demo improved balance and functional strength.     Baseline  10/14/17 - 12 seconds today without UE     Time  6    Period  Weeks    Status  Achieved      PT LONG TERM GOAL #3   Title  Pt will be able to perform SLS on LLE for at least 15 sec to be symmetrical with RLE in order to maximize pt's gait on uneven ground and decrease risk for falls.     Baseline  10/14/17 - unable to perform secondary to dizziness    Time  6    Period  Weeks    Status  On-going      PT LONG TERM GOAL #4   Title  Pt will be able ambulate at least 632f or > during 3MWT with LRAD, no pain, and gait WFL, in order to demo improved endurance and functional tolerance to maximize to return to her regular walking program.    Baseline  10/14/17 - 482 feet today in 2:40 (ended early due to dizziness and fear of falling)    Time  6    Period  Weeks    Status  On-going      PT LONG TERM GOAL #5   Title  Pt will score at least 20/24 on DGI to demo improved dynamic balance and decrease risk for falls.     Baseline  10/14/17 - unable to perform secondary to dizziness; patient reports stairs are very difficult    Time  6    Period  Weeks    Status  On-going       Plan - 10/14/17 1308    Clinical Impression Statement  Re-Assessment performed today and patient has met/partially met 3/3 short term goals and 1/5 long term goals. She has demonstrated improvement with MMT and significant improvement in Five Time Sit to Stand indicating improve functional lower  extremity strength. She remains most limited by complaints of dizziness that is brought on by activity typically and had 2 episodes of dizziness this session following 3MWT and MMT. She has been falling repeatedly on a daily basis and her MD has been made aware and contacted her neurologist to obtain an earlier appointment for her. She will benefit from ongoing skilled PT services to improve functional independence and decrease fall risk.    Rehab Potential  Good    PT Frequency  2x / week    PT Duration  6 weeks    PT Treatment/Interventions  ADLs/Self Care Home Management;Cryotherapy;Electrical Stimulation;Moist Heat;DME Instruction;Gait training;Stair training;Functional mobility training;Therapeutic activities;Therapeutic exercise;Balance training;Neuromuscular re-education;Patient/family education;Manual techniques;Passive range of motion;Dry needling;Energy conservation;Taping;Splinting    PT Next Visit Plan  Continue functional and core strenghening, gait training with SPC.  Monitor BP and dizziness throughout session. Educate on emotional side effects of stroke and to encourage patient not to be so hard on herself. Begin rocker board, step up training next session, continue wiht hurdles encourage less HHA, progress as able.      PT Home Exercise Plan  eval: bridges, s/l clams RTB; STS, hamstring curl and abduction; 10/14/17 - seated marching    Consulted and Agree with Plan of Care  Patient       Patient will benefit from skilled therapeutic intervention in order to improve the following deficits and impairments:  Abnormal gait, Cardiopulmonary status limiting activity, Decreased activity tolerance, Decreased balance, Decreased endurance, Decreased mobility, Decreased strength, Difficulty walking, Impaired sensation, Improper body mechanics, Postural dysfunction, Obesity, Pain  Visit Diagnosis: Muscle weakness (generalized)  Difficulty in walking, not elsewhere classified  Unsteadiness on  feet     Problem List Patient Active Problem List   Diagnosis Date Noted  . Sensory disturbance 09/14/2017  . Left hemiparesis ([place]) 09/14/2017  . Dysarthria   . TIA (transient ischemic attack) 09/12/2017  . Obesity, unspecified 11/11/2015  . Essential hypertension, benign 08/12/2015  . Hyperlipidemia 08/12/2015  . Cigarette nicotine dependence, uncomplicated 40/04/6760  . Headache, migraine 08/12/2015    Kipp Brood, PT, DPT Physical Therapist with [facility]  10/14/2017 1:34 PM    [place], [place], [address] Phone: [phone]   Fax:  [phone]  Name: Dominique Harrington MRN: [id number] Date of Birth: Oct 31, 1970

## 2017-10-14 NOTE — Patient Instructions (Signed)
    SEATED MARCHING: 1-2 sets of 10-15 repetitions with 3 second holds  While seated in a chair, lift up your foot and knee, set it down and then perform on the other leg. Repeat this alternating movement.

## 2017-10-14 NOTE — Therapy (Addendum)
[place], [place], [address] Phone: [phone]   Fax:  [phone]  Occupational Therapy Treatment and Discharge  Patient Details  Name: Dominique Harrington MRN: [id number] Date of Birth: Jan 31, 1971 Referring Provider: Orson Eva, MD   Encounter Date: 10/14/2017  OT End of Session - 10/14/17 1024    Visit Number  2    Number of Visits  16    Date for OT Re-Evaluation  11/22/17 10/23/17 mini reassess    Authorization Type  medicaid potential    OT Start Time  0946    OT Stop Time  1028    OT Time Calculation (min)  42 min    Activity Tolerance  Patient tolerated treatment well    Behavior During Therapy  [facility] for tasks assessed/performed       Past Medical History:  Diagnosis Date  . Anxiety   . Dyspnea on exertion 2013   Echo, EF =>55%  . Hypertension   . Migraines   . Tachycardia   . TIA (transient ischemic attack)     Past Surgical History:  Procedure Laterality Date  . BACK SURGERY    . COSMETIC SURGERY    . HERNIA REPAIR    . TUBAL LIGATION  2004    There were no vitals filed for this visit.  Subjective Assessment - 10/14/17 0943    Subjective   S: It's easier for me to pick up things now.     Currently in Pain?  No/denies         [facility] OT Assessment - 10/14/17 0943      Assessment   Medical Diagnosis  TIA with left upper extremity weaknes      Precautions   Precautions  Fall               OT Treatments/Exercises (OP) - 10/14/17 0354      Exercises   Exercises  Hand;Theraputty      Hand Exercises   Hand Gripper with Large Beads  all beads gripper set at 25#    Hand Gripper with Medium Beads  all beads gripper set at 25#    Hand Gripper with Small Beads  all beads gripper set at 25#    Sponges  18, 23      Theraputty   Theraputty - Flatten  red    Theraputty - Roll  red    Theraputty - Grip  red    Theraputty - Pinch  red-lateral and 3 point      Fine Motor  Coordination (Hand/Wrist)   Fine Motor Coordination  Manipulating coins;Stacking coins;Picking up coins    Picking up coins  Pt picked up 10 coins off tabletop with min difficulty    Manipulating coins  Pt held 5 and 10 coins working on palm to fingertip translation and placing in slotted container. Min/mod difficulty and increased time required     Stacking coins  Pt stacked 10 coins with left hand, no difficulty             OT Education - 10/14/17 0943    Education provided  Yes    Education Details  provided evaluation and reviewed goals    Person(s) Educated  Patient    Methods  Explanation;Handout    Comprehension  Verbalized understanding       OT Short Term Goals - 10/14/17 1006      OT SHORT TERM GOAL #1   Title  Patient  will be educated on HEP for LUE strengthening in order to improve functional use of left arm.      Time  4    Period  Weeks    Status  On-going      OT SHORT TERM GOAL #2   Title  Patient will improve left arm strength by 1 muscle grade for improved stability in his left shoulder needed for ADL completion.    Time  4    Period  Weeks    Status  On-going      OT SHORT TERM GOAL #3   Title  Paitent will improve left grip strength by 10# and pinch strength by 2# in order to improve ability to maintain grasp on ots and pans while cooking.      Time  4    Period  Weeks    Status  On-going      OT SHORT TERM GOAL #4   Title  Patient will improve left hand coordination by decreasing completion time on nine hole peg test by 3 seconds.      Time  4    Period  Weeks    Status  On-going      OT SHORT TERM GOAL #5   Title  Patient will decrease left arm pain to 3/10 or less when completing ADLs.    Time  4    Period  Weeks    Status  On-going        OT Long Term Goals - 10/14/17 1006      OT LONG TERM GOAL #1   Title  Patient will return to prior level of independence with all daily activities utilizing her left arm as an active assit iwth all  daily activities.      Time  8    Period  Weeks    Status  On-going      OT LONG TERM GOAL #2   Title  Patient will improve left arm strength to 5/5 for improved functional use of left arm when placing items in overhead cabinets.    Time  8    Period  Weeks    Status  On-going      OT LONG TERM GOAL #3   Title  Patient will improve left hand grip strength by 40# and pinch strength 8# in order to improve ability to grip cups and cookware.      Time  8    Period  Weeks    Status  On-going      OT LONG TERM GOAL #4   Title  Patient will improve left hand coordination for improve independence with in hand manipulation and fastening buttons by decreasing completion time on nine hole peg test to 26".      Time  8    Period  Weeks    Status  On-going      OT LONG TERM GOAL #5   Title  Patient will improve cognition for improved ability to complete executive function tasks independently.      Time  8    Period  Weeks    Status  On-going            Plan - 10/14/17 1006    Clinical Impression Statement  A: Initiated grip strengthening and coordination tasks this session, pt requiring intermittent rest breaks during hand gripper task for fatigue. Pt reports she practices with her putty daily. Upgraded putty to red this session.     Plan  P: Increase gripper resistance if pt able to complete. Add pegboard task for coordination.        Patient will benefit from skilled therapeutic intervention in order to improve the following deficits and impairments:  Decreased cognition, Decreased range of motion, Impaired perceived functional ability, Pain, Impaired UE functional use, Decreased strength, Decreased safety awareness, Decreased coordination  Visit Diagnosis: Other symptoms and signs involving the musculoskeletal system  Other lack of coordination    Problem List Patient Active Problem List   Diagnosis Date Noted  . Sensory disturbance 09/14/2017  . Left hemiparesis ([place])  09/14/2017  . Dysarthria   . TIA (transient ischemic attack) 09/12/2017  . Obesity, unspecified 11/11/2015  . Essential hypertension, benign 08/12/2015  . Hyperlipidemia 08/12/2015  . Cigarette nicotine dependence, uncomplicated 35/00/9381  . Headache, migraine 08/12/2015   Guadelupe Sabin, OTR/L  [phone] 10/14/2017, 12:14 PM  [place] [address] [place], [place], [address] Phone: [phone]   Fax:  [phone]  Name: Dominique Harrington MRN: [id number] Date of Birth: 1970-11-14   OCCUPATIONAL THERAPY DISCHARGE SUMMARY  Visits from Start of Care: 2  Current functional level related to goals / functional outcomes: Unknown. Pt requesting to be discharged due to ongoing medical issues and inability to attend therapy.    Remaining deficits: Unknown     Education / Equipment: HEP Plan: Patient agrees to discharge.  Patient goals were not met. Patient is being discharged due to the patient's request.  ?????

## 2017-10-18 ENCOUNTER — Telehealth (HOSPITAL_COMMUNITY): Payer: Self-pay | Admitting: Physician Assistant

## 2017-10-18 ENCOUNTER — Ambulatory Visit (HOSPITAL_COMMUNITY): Payer: Medicaid Other

## 2017-10-18 NOTE — Telephone Encounter (Signed)
10/18/17  pt left a message to cx today's appt because she is extremely dizzy and has fallen 3 times

## 2017-10-21 ENCOUNTER — Ambulatory Visit (HOSPITAL_COMMUNITY): Payer: Medicaid Other

## 2017-10-21 ENCOUNTER — Ambulatory Visit (HOSPITAL_COMMUNITY): Payer: MEDICAID | Attending: Family Medicine

## 2017-10-21 ENCOUNTER — Encounter (HOSPITAL_COMMUNITY): Payer: Self-pay

## 2017-10-21 ENCOUNTER — Ambulatory Visit (HOSPITAL_COMMUNITY): Payer: Medicaid Other | Admitting: Speech Pathology

## 2017-10-21 DIAGNOSIS — R262 Difficulty in walking, not elsewhere classified: Secondary | ICD-10-CM | POA: Insufficient documentation

## 2017-10-21 DIAGNOSIS — M6281 Muscle weakness (generalized): Secondary | ICD-10-CM | POA: Insufficient documentation

## 2017-10-21 DIAGNOSIS — R2681 Unsteadiness on feet: Secondary | ICD-10-CM | POA: Insufficient documentation

## 2017-10-21 DIAGNOSIS — R29898 Other symptoms and signs involving the musculoskeletal system: Secondary | ICD-10-CM | POA: Diagnosis not present

## 2017-10-21 NOTE — Therapy (Signed)
[place], [place], [address] Phone: [phone]   Fax:  [phone]  Physical Therapy Treatment  Patient Details  Name: Dominique Harrington MRN: [id number] Date of Birth: Jul 03, 1971 Referring Provider: Orson Eva, MD   Encounter Date: 10/21/2017  PT End of Session - 10/21/17 0906    Visit Number  5    Number of Visits  13    Date for PT Re-Evaluation  10/29/17    Authorization Type  Self-pay (waiting on Medicaid approval)    Authorization Time Period  09/17/17 to 10/29/17    PT Start Time  0902    PT Stop Time  0920    PT Time Calculation (min)  18 min    Activity Tolerance  Other (comment);No increased pain C/o dizziness range from 6-8/10    Behavior During Therapy  [facility] for tasks assessed/performed       Past Medical History:  Diagnosis Date  . Anxiety   . Dyspnea on exertion 2013   Echo, EF =>55%  . Hypertension   . Migraines   . Tachycardia   . TIA (transient ischemic attack)     Past Surgical History:  Procedure Laterality Date  . BACK SURGERY    . COSMETIC SURGERY    . HERNIA REPAIR    . TUBAL LIGATION  2004    There were no vitals filed for this visit.  Subjective Assessment - 10/21/17 0904    Subjective  Pt stated she continues to have dizziness and falls daily.  No reports of pain today.  Is on the waiting lift at neurologist, has apt scheduled in April.      Patient Stated Goals  get back to self and lose the cane    Currently in Pain?  No/denies           [facility] Adult PT Treatment/Exercise - 10/21/17 1050      Knee/Hip Exercises: Standing   Rocker Board  2 minutes lateral and DF/PF      Knee/Hip Exercises: Seated   Sit to Sand  without UE support;10 reps         PT Short Term Goals - 10/14/17 1309      PT SHORT TERM GOAL #1   Title  Pt will be independent with HEP and perform consistently in order to maximize return to PLOF.    Baseline  10/14/17 - patient report    Time  3     Period  Weeks    Status  Achieved      PT SHORT TERM GOAL #2   Title  Pt will have 1/2 grade improvement in MMT in order to maximize balance and gait.     Baseline  10/14/17 - some groups tested improved by 1/2 grade    Time  3    Period  Weeks    Status  Partially Met      PT SHORT TERM GOAL #3   Title  Pt will report being able to cook for 15 mins without requiring a rest break in order to demo improved functional strength and tolerance to standing and walking.     Baseline  10/14/17 - inconsistant report of performance. Patient can parform activities for 30 minutes at times and on other occassions cannot perform upright activities for more than a couple minutes due to dizziness.    Time  3    Period  Weeks    Status  Partially Met  PT Long Term Goals - 10/14/17 1311      PT LONG TERM GOAL #1   Title  Pt will have 1 grade improvment in MMT of all muscle groups tested in order to further promote pt's balance, gait, and return to regular exercise routine.     Baseline  10/14/17 - some groups tested improved by 1/2 grade    Time  6    Period  Weeks    Status  On-going      PT LONG TERM GOAL #2   Title  Pt will be able to perform 5xSTS in 20 sec or < with no evidence of unsteadiness, proper mechanics, and no UE support in order to demo improved balance and functional strength.     Baseline  10/14/17 - 12 seconds today without UE     Time  6    Period  Weeks    Status  Achieved      PT LONG TERM GOAL #3   Title  Pt will be able to perform SLS on LLE for at least 15 sec to be symmetrical with RLE in order to maximize pt's gait on uneven ground and decrease risk for falls.     Baseline  10/14/17 - unable to perform secondary to dizziness    Time  6    Period  Weeks    Status  On-going      PT LONG TERM GOAL #4   Title  Pt will be able ambulate at least 677f or > during 3MWT with LRAD, no pain, and gait WFL, in order to demo improved endurance and functional tolerance to  maximize to return to her regular walking program.    Baseline  10/14/17 - 482 feet today in 2:40 (ended early due to dizziness and fear of falling)    Time  6    Period  Weeks    Status  On-going      PT LONG TERM GOAL #5   Title  Pt will score at least 20/24 on DGI to demo improved dynamic balance and decrease risk for falls.     Baseline  10/14/17 - unable to perform secondary to dizziness; patient reports stairs are very difficult    Time  6    Period  Weeks    Status  On-going            Plan - 10/21/17 0935    Clinical Impression Statement  Pt reports of intermitternt dizziness and falls daily.  Vitals assess with BP at 119/92 mmHg and HR range from 100-120 bpm.  Session focus on balance training, began on rockerboard with reports of increased dizziness range from 6-8/10.  Pt reports she has MD apt scheduled for Monday.  Ended session early and have cancelled apts until following MD apt.      Rehab Potential  Good    PT Frequency  2x / week    PT Duration  6 weeks    PT Treatment/Interventions  ADLs/Self Care Home Management;Cryotherapy;Electrical Stimulation;Moist Heat;DME Instruction;Gait training;Stair training;Functional mobility training;Therapeutic activities;Therapeutic exercise;Balance training;Neuromuscular re-education;Patient/family education;Manual techniques;Passive range of motion;Dry needling;Energy conservation;Taping;Splinting    PT Next Visit Plan  Continue functional and core strenghening, gait training with SPC.  Monitor BP and dizziness throughout session. Educate on emotional side effects of stroke and to encourage patient not to be so hard on herself. Begin rocker board, step up training next session, continue wiht hurdles encourage less HHA, progress as able.  PT Home Exercise Plan  eval: bridges, s/l clams RTB; STS, hamstring curl and abduction; 10/14/17 - seated marching       Patient will benefit from skilled therapeutic intervention in order to  improve the following deficits and impairments:     Visit Diagnosis: Muscle weakness (generalized)  Difficulty in walking, not elsewhere classified  Unsteadiness on feet     Problem List Patient Active Problem List   Diagnosis Date Noted  . Sensory disturbance 09/14/2017  . Left hemiparesis ([place]) 09/14/2017  . Dysarthria   . TIA (transient ischemic attack) 09/12/2017  . Obesity, unspecified 11/11/2015  . Essential hypertension, benign 08/12/2015  . Hyperlipidemia 08/12/2015  . Cigarette nicotine dependence, uncomplicated 83/38/2505  . Headache, migraine 08/12/2015   Ihor Austin, [place]; [place]  Aldona Lento 10/21/2017, 10:50 AM  [place], [place], [address] Phone: [phone]   Fax:  [phone]  Name: Dominique Harrington MRN: [id number] Date of Birth: 06/14/1971

## 2017-10-25 ENCOUNTER — Ambulatory Visit: Payer: Self-pay | Admitting: Physician Assistant

## 2017-10-25 ENCOUNTER — Encounter: Payer: Self-pay | Admitting: Physician Assistant

## 2017-10-25 ENCOUNTER — Encounter (HOSPITAL_COMMUNITY): Payer: Self-pay

## 2017-10-25 VITALS — BP 133/85 | HR 73 | Temp 97.7°F | Ht 66.5 in | Wt 283.5 lb

## 2017-10-25 DIAGNOSIS — R42 Dizziness and giddiness: Secondary | ICD-10-CM

## 2017-10-25 DIAGNOSIS — F32A Depression, unspecified: Secondary | ICD-10-CM

## 2017-10-25 DIAGNOSIS — E785 Hyperlipidemia, unspecified: Secondary | ICD-10-CM

## 2017-10-25 DIAGNOSIS — G8194 Hemiplegia, unspecified affecting left nondominant side: Secondary | ICD-10-CM

## 2017-10-25 DIAGNOSIS — H00011 Hordeolum externum right upper eyelid: Secondary | ICD-10-CM

## 2017-10-25 DIAGNOSIS — F329 Major depressive disorder, single episode, unspecified: Secondary | ICD-10-CM

## 2017-10-25 DIAGNOSIS — I10 Essential (primary) hypertension: Secondary | ICD-10-CM

## 2017-10-25 DIAGNOSIS — F17210 Nicotine dependence, cigarettes, uncomplicated: Secondary | ICD-10-CM

## 2017-10-25 NOTE — Progress Notes (Signed)
BP 133/85 (BP Location: Left Arm, Patient Position: Sitting, Cuff Size: Normal)   Pulse 73   Temp 97.7 F (36.5 C)   Ht 5' 6.5" (1.689 m)   Wt 283 lb 8 oz (128.6 kg)   LMP 12/19/2014   SpO2 100%   BMI 45.07 kg/m    Subjective:    Patient ID: Antony Hastearrie P Albrecht, female    DOB: 1971-01-02, 47 y.o.   MRN: [id number]  HPI: Antony HasteCarrie P Vasudevan is a 47 y.o. female presenting on 10/25/2017 for Hypertension   HPI  Pt has been having lots of dizziness.   She is still falling at home despite using a cane for assistance.   When asked whether she feels like she is falling more due to weakness or problems with balance, she said definitely balance and coordination problems.   She has been continuing with PT but session last Thursday was stopped early due to fears of falling and dizziness.  PT note states BP 119/92 with HR 100-120.   [facility].  She is having depression issues due to the loss of her independence.   Pt thinks her BP up some today due to HA.  Pt with long history of migraine.   Pt [facility]   Pt is having lots of problems still with her memory.    Pt thinks she has a stye- started swelling on Saturday-  R eye.  No changes to vision.   Relevant past medical, surgical, family and social history reviewed and updated as indicated. Interim medical history since our last visit reviewed. Allergies and medications reviewed and updated.   Current Outpatient Medications:  .  aspirin EC 81 MG tablet, Take 81 mg by mouth daily., Disp: , Rfl:  .  atorvastatin (LIPITOR) 20 MG tablet, Take 1 tablet (20 mg total) by mouth daily. (Patient taking differently: Take 20 mg by mouth at bedtime. ), Disp: 90 tablet, Rfl: 3 .  ibuprofen (ADVIL,MOTRIN) 200 MG tablet, Take 400-800 mg by mouth every 6 (six) hours as needed for headache., Disp: , Rfl:  .  lisinopril-hydrochlorothiazide (ZESTORETIC) 20-12.5 MG tablet, Take 1 tablet by  mouth daily., Disp: 1 tablet, Rfl: 00 .  Omega-3 Fatty Acids (FISH OIL PO), Take 2,000 mg by mouth 2 (two) times daily. , Disp: , Rfl:    Review of Systems  Constitutional: Positive for chills and fatigue. Negative for appetite change, diaphoresis, fever and unexpected weight change.  HENT: Positive for dental problem and sneezing. Negative for congestion, drooling, ear pain, facial swelling, hearing loss, mouth sores, sore throat, trouble swallowing and voice change.   Eyes: Negative for pain, discharge, redness, itching and visual disturbance.  Respiratory: Negative for cough, choking, shortness of breath and wheezing.   Cardiovascular: Negative for chest pain, palpitations and leg swelling.  Gastrointestinal: Negative for abdominal pain, blood in stool, constipation, diarrhea and vomiting.  Endocrine: Positive for polydipsia. Negative for cold intolerance and heat intolerance.  Genitourinary: Negative for decreased urine volume, dysuria and hematuria.  Musculoskeletal: Positive for gait problem. Negative for arthralgias and back pain.  Skin: Negative for rash.  Allergic/Immunologic: Negative for environmental allergies.  Neurological: Positive for light-headedness and headaches. Negative for seizures and syncope.  Hematological: Negative for adenopathy.  Psychiatric/Behavioral: Positive for agitation and dysphoric mood. Negative for suicidal ideas. The patient is nervous/anxious.     Per HPI unless specifically indicated above     Objective:  BP 133/85 (BP Location: Left Arm, Patient Position: Sitting, Cuff Size: Normal)   Pulse 73   Temp 97.7 F (36.5 C)   Ht 5' 6.5" (1.689 m)   Wt 283 lb 8 oz (128.6 kg)   LMP 12/19/2014   SpO2 100%   BMI 45.07 kg/m   Wt Readings from Last 3 Encounters:  10/25/17 283 lb 8 oz (128.6 kg)  10/07/17 278 lb (126.1 kg)  10/07/17 278 lb (126.1 kg)    Physical Exam  Constitutional: She is oriented to person, place, and time. She appears  well-developed and well-nourished.  HENT:  Head: Normocephalic and atraumatic.  Eyes: Conjunctivae and EOM are normal. Pupils are equal, round, and reactive to light. Right eye exhibits hordeolum.  Neck: Neck supple.  Cardiovascular: Normal rate and regular rhythm.  Pulmonary/Chest: Effort normal and breath sounds normal.  Abdominal: Soft. Bowel sounds are normal. She exhibits no mass. There is no hepatosplenomegaly. There is no tenderness.  Musculoskeletal: She exhibits no edema.  Lymphadenopathy:    She has no cervical adenopathy.  Neurological: She is alert and oriented to person, place, and time. Coordination and gait abnormal.  Mild L sided weakness, upper and lower extremities.  Obvious difficulty walking.  Using a cane.   Skin: Skin is warm and dry.  Psychiatric: She has a normal mood and affect. Her behavior is normal. Cognition and memory are impaired.  Memory mostly good but she does have trouble remembering some things- her husband assists her with those things she forgets  Vitals reviewed.       Assessment & Plan:    Encounter Diagnoses  Name Primary?  . Dizziness Yes  . Hemiparesis of left nondominant side, unspecified hemiparesis etiology (HCC)   . Depression, unspecified depression type   . Essential hypertension, benign   . Hyperlipidemia, unspecified hyperlipidemia type   . Cigarette nicotine dependence, uncomplicated   . Morbid obesity (HCC)   . Hordeolum of right upper eyelid, unspecified hordeolum type     -pt encouraged to apply warm compresses to stye -pt to continue with PT -recommended pt use a rolling walker to help with the problems she is having with getting around.  Discussed that it will also help reduce chances of a major fall/injury causing fall -pt has appt with neurology scheduled for April -pt to keep appt with MH later this week for depression -pt to follow up 1 month.  RTO sooner prn

## 2017-10-28 ENCOUNTER — Ambulatory Visit (HOSPITAL_COMMUNITY): Payer: Medicaid Other

## 2017-10-28 ENCOUNTER — Telehealth (HOSPITAL_COMMUNITY): Payer: Self-pay | Admitting: Physician Assistant

## 2017-10-28 ENCOUNTER — Ambulatory Visit (HOSPITAL_COMMUNITY): Payer: Medicaid Other | Admitting: Occupational Therapy

## 2017-10-28 NOTE — Telephone Encounter (Signed)
10/28/17  pt left a message to cx - said she had a stomach virus

## 2017-11-01 ENCOUNTER — Telehealth (HOSPITAL_COMMUNITY): Payer: Self-pay | Admitting: Physician Assistant

## 2017-11-01 NOTE — Telephone Encounter (Signed)
11/01/17  Left a message to let patient know that there were no more appts scheduled for her but I did add her to our wait list

## 2017-11-03 ENCOUNTER — Ambulatory Visit: Payer: Self-pay | Admitting: Physician Assistant

## 2017-11-10 ENCOUNTER — Telehealth (HOSPITAL_COMMUNITY): Payer: Self-pay | Admitting: Speech Pathology

## 2017-11-10 ENCOUNTER — Ambulatory Visit (HOSPITAL_COMMUNITY): Payer: MEDICAID | Admitting: Speech Pathology

## 2017-11-10 ENCOUNTER — Ambulatory Visit (HOSPITAL_COMMUNITY): Payer: MEDICAID

## 2017-11-10 NOTE — Telephone Encounter (Signed)
Pt requested to be d/c for all future appts until she get well and will return with a new referral

## 2017-11-12 ENCOUNTER — Ambulatory Visit (HOSPITAL_COMMUNITY): Payer: No Typology Code available for payment source | Admitting: Occupational Therapy

## 2017-12-01 ENCOUNTER — Ambulatory Visit: Payer: Self-pay | Admitting: Physician Assistant

## 2017-12-01 ENCOUNTER — Encounter: Payer: Self-pay | Admitting: Physician Assistant

## 2017-12-01 VITALS — BP 110/75 | HR 97 | Temp 96.3°F | Ht 66.5 in | Wt 283.5 lb

## 2017-12-01 DIAGNOSIS — I10 Essential (primary) hypertension: Secondary | ICD-10-CM

## 2017-12-01 DIAGNOSIS — F17210 Nicotine dependence, cigarettes, uncomplicated: Secondary | ICD-10-CM

## 2017-12-01 DIAGNOSIS — E785 Hyperlipidemia, unspecified: Secondary | ICD-10-CM

## 2017-12-01 DIAGNOSIS — G43909 Migraine, unspecified, not intractable, without status migrainosus: Secondary | ICD-10-CM

## 2017-12-01 NOTE — Progress Notes (Signed)
BP 110/75 (BP Location: Right Arm, Patient Position: Sitting, Cuff Size: Normal)   Pulse 97   Temp (!) 96.3 F (35.7 C)   Ht 5' 6.5" (1.689 m)   Wt 283 lb 8 oz (128.6 kg)   LMP 12/19/2014   SpO2 100%   BMI 45.07 kg/m    Subjective:    Patient ID: Dominique Harrington, female    DOB: May 01, 1971, 47 y.o.   MRN: [id number]  HPI: Dominique Harrington is a 47 y.o. female presenting on 12/01/2017 for Hyperlipidemia and Hypertension   HPI   Pt says she is doing a lot better.  She is walking without her cane.  She drove herself this morning.  She is no longer doing therapy.  Pt didn't get her labs drawn  Pt states her mood is improved in light of her improvement in ability to be independent.   Relevant past medical, surgical, family and social history reviewed and updated as indicated. Interim medical history since our last visit reviewed. Allergies and medications reviewed and updated.   Current Outpatient Medications:  .  aspirin EC 81 MG tablet, Take 81 mg by mouth daily., Disp: , Rfl:  .  atorvastatin (LIPITOR) 20 MG tablet, Take 1 tablet (20 mg total) by mouth daily. (Patient taking differently: Take 20 mg by mouth at bedtime. ), Disp: 90 tablet, Rfl: 3 .  ibuprofen (ADVIL,MOTRIN) 200 MG tablet, Take 400-800 mg by mouth every 6 (six) hours as needed for headache., Disp: , Rfl:  .  lisinopril-hydrochlorothiazide (ZESTORETIC) 20-12.5 MG tablet, Take 1 tablet by mouth daily., Disp: 1 tablet, Rfl: 00 .  Omega-3 Fatty Acids (FISH OIL PO), Take 2,000 mg by mouth 2 (two) times daily. , Disp: , Rfl:    Review of Systems  Constitutional: Positive for fatigue. Negative for appetite change, chills, diaphoresis, fever and unexpected weight change.  HENT: Positive for congestion. Negative for dental problem, drooling, ear pain, facial swelling, hearing loss, mouth sores, sneezing, sore throat, trouble swallowing and voice change.   Eyes: Negative for pain, discharge, redness, itching and visual  disturbance.  Respiratory: Positive for cough. Negative for choking, shortness of breath and wheezing.   Cardiovascular: Negative for chest pain, palpitations and leg swelling.  Gastrointestinal: Negative for abdominal pain, blood in stool, constipation, diarrhea and vomiting.  Endocrine: Negative for cold intolerance, heat intolerance and polydipsia.  Genitourinary: Negative for decreased urine volume, dysuria and hematuria.  Musculoskeletal: Negative for arthralgias, back pain and gait problem.  Skin: Negative for rash.  Allergic/Immunologic: Negative for environmental allergies.  Neurological: Positive for headaches. Negative for seizures, syncope and light-headedness.  Hematological: Negative for adenopathy.  Psychiatric/Behavioral: Positive for agitation and dysphoric mood. Negative for suicidal ideas. The patient is nervous/anxious.     Per HPI unless specifically indicated above     Objective:    BP 110/75 (BP Location: Right Arm, Patient Position: Sitting, Cuff Size: Normal)   Pulse 97   Temp (!) 96.3 F (35.7 C)   Ht 5' 6.5" (1.689 m)   Wt 283 lb 8 oz (128.6 kg)   LMP 12/19/2014   SpO2 100%   BMI 45.07 kg/m   Wt Readings from Last 3 Encounters:  12/01/17 283 lb 8 oz (128.6 kg)  10/25/17 283 lb 8 oz (128.6 kg)  10/07/17 278 lb (126.1 kg)    Physical Exam  Constitutional: She is oriented to person, place, and time. She appears well-developed and well-nourished.  HENT:  Head: Normocephalic and atraumatic.  Neck:  Neck supple.  Cardiovascular: Normal rate and regular rhythm.  Pulmonary/Chest: Effort normal and breath sounds normal.  Abdominal: Soft. Bowel sounds are normal. She exhibits no mass. There is no hepatosplenomegaly. There is no tenderness.  Musculoskeletal: She exhibits no edema.  Lymphadenopathy:    She has no cervical adenopathy.  Neurological: She is alert and oriented to person, place, and time. She has normal strength. She displays no tremor. No  cranial nerve deficit or sensory deficit. She exhibits normal muscle tone. She displays a negative Romberg sign. Coordination and gait normal.  Skin: Skin is warm and dry.  Psychiatric: She has a normal mood and affect. Her behavior is normal.  Vitals reviewed.    EKG- SR at 84 bpm.  PVCs- 3 in approximately 20 seconds.  Otherwise no changes from EKGs on 10/07/17 and 10/08/17.       Assessment & Plan:    Encounter Diagnoses  Name Primary?  . Essential hypertension, benign Yes  . Hyperlipidemia, unspecified hyperlipidemia type   . Cigarette nicotine dependence, uncomplicated   . Morbid obesity (HCC)   . Migraine syndrome     -pt to get fasting Labs tomorrow.  Will call with results -pt to Continue exercises from physical therapist -Pt to still see neurologist due to HA and recommendations for treatment -counseled on smoking cessation -pt was given iFOBT for colon cancer screening -pt to follow up in 3 months.  RTO sooner prn

## 2017-12-01 NOTE — Patient Instructions (Signed)
Coping with Quitting Smoking Quitting smoking is a physical and mental challenge. You will face cravings, withdrawal symptoms, and temptation. Before quitting, work with your health care provider to make a plan that can help you cope. Preparation can help you quit and keep you from giving in. How can I cope with cravings? Cravings usually last for 5-10 minutes. If you get through it, the craving will pass. Consider taking the following actions to help you cope with cravings:  Keep your mouth busy: ? Chew sugar-free gum. ? Suck on hard candies or a straw. ? Brush your teeth.  Keep your hands and body busy: ? Immediately change to a different activity when you feel a craving. ? Squeeze or play with a ball. ? Do an activity or a hobby, like making bead jewelry, practicing needlepoint, or working with wood. ? Mix up your normal routine. ? Take a short exercise break. Go for a quick walk or run up and down stairs. ? Spend time in public places where smoking is not allowed.  Focus on doing something kind or helpful for someone else.  Call a friend or family member to talk during a craving.  Join a support group.  Call a quit line, such as 1-800-QUIT-NOW.  Talk with your health care provider about medicines that might help you cope with cravings and make quitting easier for you.  How can I deal with withdrawal symptoms? Your body may experience negative effects as it tries to get used to not having nicotine in the system. These effects are called withdrawal symptoms. They may include:  Feeling hungrier than normal.  Trouble concentrating.  Irritability.  Trouble sleeping.  Feeling depressed.  Restlessness and agitation.  Craving a cigarette.  To manage withdrawal symptoms:  Avoid places, people, and activities that trigger your cravings.  Remember why you want to quit.  Get plenty of sleep.  Avoid coffee and other caffeinated drinks. These may worsen some of your  symptoms.  How can I handle social situations? Social situations can be difficult when you are quitting smoking, especially in the first few weeks. To manage this, you can:  Avoid parties, bars, and other social situations where people might be smoking.  Avoid alcohol.  Leave right away if you have the urge to smoke.  Explain to your family and friends that you are quitting smoking. Ask for understanding and support.  Plan activities with friends or family where smoking is not an option.  What are some ways I can cope with stress? Wanting to smoke may cause stress, and stress can make you want to smoke. Find ways to manage your stress. Relaxation techniques can help. For example:  Breathe slowly and deeply, in through your nose and out through your mouth.  Listen to soothing, relaxing music.  Talk with a family member or friend about your stress.  Light a candle.  Soak in a bath or take a shower.  Think about a peaceful place.  What are some ways I can prevent weight gain? Be aware that many people gain weight after they quit smoking. However, not everyone does. To keep from gaining weight, have a plan in place before you quit and stick to the plan after you quit. Your plan should include:  Having healthy snacks. When you have a craving, it may help to: ? Eat plain popcorn, crunchy carrots, celery, or other cut vegetables. ? Chew sugar-free gum.  Changing how you eat: ? Eat small portion sizes at meals. ?   Eat 4-6 small meals throughout the day instead of 1-2 large meals a day. ? Be mindful when you eat. Do not watch television or do other things that might distract you as you eat.  Exercising regularly: ? Make time to exercise each day. If you do not have time for a long workout, do short bouts of exercise for 5-10 minutes several times a day. ? Do some form of strengthening exercise, like weight lifting, and some form of aerobic exercise, like running or  swimming.  Drinking plenty of water or other low-calorie or no-calorie drinks. Drink 6-8 glasses of water daily, or as much as instructed by your health care provider.  Summary  Quitting smoking is a physical and mental challenge. You will face cravings, withdrawal symptoms, and temptation to smoke again. Preparation can help you as you go through these challenges.  You can cope with cravings by keeping your mouth busy (such as by chewing gum), keeping your body and hands busy, and making calls to family, friends, or a helpline for people who want to quit smoking.  You can cope with withdrawal symptoms by avoiding places where people smoke, avoiding drinks with caffeine, and getting plenty of rest.  Ask your health care provider about the different ways to prevent weight gain, avoid stress, and handle social situations. This information is not intended to replace advice given to you by your health care provider. Make sure you discuss any questions you have with your health care provider. Document Released: 09/11/2016 Document Revised: 09/11/2016 Document Reviewed: 09/11/2016 Elsevier Interactive Patient Education  2018 [employer]  

## 2017-12-30 ENCOUNTER — Encounter (HOSPITAL_COMMUNITY): Payer: Self-pay

## 2017-12-30 NOTE — Therapy (Signed)
[place], [place], [address] Phone: [phone]   Fax:  [phone]  Patient Details  Name: Dominique Harrington MRN: [id number] Date of Birth: May 11, 1971 Referring Provider:  No ref. provider found  Encounter Date: 12/30/2017   PHYSICAL THERAPY DISCHARGE SUMMARY  Visits from Start of Care: 5  Current functional level related to goals / functional outcomes: See last treatment note   Remaining deficits: See last treatment note   Education / Equipment: n/a Plan: Patient agrees to discharge.  Patient goals were partially met. Patient is being discharged due to not returning since the last visit.  ?????      Geraldine Solar PT, [place] [address] [place], [place], [address] Phone: [phone]   Fax:  [phone]

## 2017-12-31 ENCOUNTER — Encounter: Payer: Self-pay | Admitting: Neurology

## 2017-12-31 ENCOUNTER — Ambulatory Visit (INDEPENDENT_AMBULATORY_CARE_PROVIDER_SITE_OTHER): Payer: Self-pay | Admitting: Neurology

## 2017-12-31 VITALS — BP 96/54 | HR 103 | Resp 16 | Ht 67.0 in | Wt 288.2 lb

## 2017-12-31 DIAGNOSIS — F329 Major depressive disorder, single episode, unspecified: Secondary | ICD-10-CM

## 2017-12-31 DIAGNOSIS — I639 Cerebral infarction, unspecified: Secondary | ICD-10-CM

## 2017-12-31 DIAGNOSIS — F17200 Nicotine dependence, unspecified, uncomplicated: Secondary | ICD-10-CM

## 2017-12-31 DIAGNOSIS — I10 Essential (primary) hypertension: Secondary | ICD-10-CM

## 2017-12-31 DIAGNOSIS — R2990 Unspecified symptoms and signs involving the nervous system: Secondary | ICD-10-CM

## 2017-12-31 DIAGNOSIS — E782 Mixed hyperlipidemia: Secondary | ICD-10-CM

## 2017-12-31 DIAGNOSIS — F32A Depression, unspecified: Secondary | ICD-10-CM

## 2017-12-31 DIAGNOSIS — G4440 Drug-induced headache, not elsewhere classified, not intractable: Secondary | ICD-10-CM

## 2017-12-31 DIAGNOSIS — F419 Anxiety disorder, unspecified: Secondary | ICD-10-CM

## 2017-12-31 DIAGNOSIS — G43719 Chronic migraine without aura, intractable, without status migrainosus: Secondary | ICD-10-CM

## 2017-12-31 MED ORDER — TOPIRAMATE 50 MG PO TABS: 50.0000 mg | ORAL_TABLET | Freq: Every day | ORAL | 2 refills | Status: DC

## 2017-12-31 MED ORDER — PROMETHAZINE HCL 25 MG PO TABS: 25.0000 mg | ORAL_TABLET | Freq: Four times a day (QID) | ORAL | 2 refills | Status: DC | PRN

## 2017-12-31 MED ORDER — TRAMADOL HCL 50 MG PO TABS: 50.0000 mg | ORAL_TABLET | Freq: Four times a day (QID) | ORAL | 2 refills | Status: DC | PRN

## 2017-12-31 NOTE — Progress Notes (Signed)
NEUROLOGY CONSULTATION NOTE  Dominique Harrington MRN: [id number] DOB: 04/09/1971  Referring provider: Hospital referral Primary care provider: Jacquelin Hawking, PA-C  Reason for consult:  TIA event  HISTORY OF PRESENT ILLNESS: CERRA Harrington is a 47 year old right-handed female with hypertension, hyperlipidemia, tobacco use, depression and migraines who presents for TIA-like episode.  She is accompanied by her husband who supplements history.  History supplemented by hospital and ED notes.  She was admitted to [facility] from 09/12/17 to 09/14/17 after waking up with dysarthria and left sided numbness of face, arm and leg and weakness of arm and leg.  Symptoms mostly resolved in hospital but had some residual weakness.  She underwent a stroke workup.  MRI of brain was negative for acute abnormalities.  MRA of head demonstrated no large vessel occlusion or stenosis.  Carotid doppler revealed no hemodynamically significant ICA stenosis.  Echocardiogram showed EF 55-60% with no cardiac source of emboli.  In-house neurology had no explanation for her symptoms.  She was started on ASA 162mg  daily.  She was discharged and had an outpatient EEG on 09/16/17 which was normal.  She had been going to outpatient rehab until mid January.  She began experiencing dizziness with lightheadedness.  She reported that her blood pressure had been fluctuating.  She presented to the ED at [facility] where she had a witnessed syncopal episode.  When she regained consciousness, she reported that she felt she was getting a migraine.  She denied palpitations.  EKG was normal.  CT of head was negative.  Her PCP discontinued her metoprolol.  She has been ambulating with a walker and sometimes a cane.  She still feels dizzy at times.  Blood pressure has reportedly fluctuated as high as 180s/130s.  She reports several falls.  She reports significant anxiety and depression.  She is a smoker and currently trying to quit with  the patch.  She has history of migraines since her early 67s.  They became worse after the birth of her daughter in her late 106s and second daughter in her 33s.  They are bifrontal radiating to the back of head and base of neck.  It is a severe stabbing pain that lasts 4 to 5 days and occurs every week.  There is associated nausea, photophobia.  There is no associated unilateral numbness, weakness, or visual disturbance.  Current NSAIDS:  ibuprofen 400-800mg  (takes daily), ASA 81mg  (secondary stroke prevention) Current analgesics:  no Current triptans:  no Current anti-emetic:  no Current muscle relaxants:  no Current anti-anxiolytic:  no Current sleep aide:  no Current Antihypertensive medications:  lisinopril-HCTZ Current Antidepressant medications:  no Current Anticonvulsant medications:  no Current Vitamins/Herbal/Supplements:  no Current Antihistamines/Decongestants:  no Other therapy:  no  Past NSAIDS:  Aleve Past analgesics:  Fioricet Past abortive triptans:  Maxalt 10mg  Past muscle relaxants:  Flexeril Past anti-emetic:  Reglan 10mg , Phenergan 25mg  Past antihypertensive medications:  metoprolol 100mg  twice daly Past antidepressant medications:  Amitriptyline (ineffective), citalopram 20mg  Past anticonvulsant medications:  no Past vitamins/Herbal/Supplements:  no Past antihistamines/decongestants:  no Other past therapies:  no  Caffeine:  caffeinated soda daily Alcohol:  no Smoker:  Yes.  Currently trying to quit with patch Diet:  Does not hydrate enough Exercise:  Not routine Depression:  yes; Anxiety:  yes Other pain:  no Sleep hygiene:  poor Family history of headache:  No.  No known family history of stroke at young age. No history of seizures or head  injury.  PAST MEDICAL HISTORY: Past Medical History:  Diagnosis Date  . Anxiety   . Dyspnea on exertion 2013   Echo, EF =>55%  . Hypertension   . Migraines   . Tachycardia   . TIA (transient ischemic attack)      PAST SURGICAL HISTORY: Past Surgical History:  Procedure Laterality Date  . BACK SURGERY    . COSMETIC SURGERY    . HERNIA REPAIR    . TUBAL LIGATION  2004    MEDICATIONS: Current Outpatient Medications on File Prior to Visit  Medication Sig Dispense Refill  . aspirin EC 81 MG tablet Take 81 mg by mouth daily.    Marland Kitchen atorvastatin (LIPITOR) 20 MG tablet Take 1 tablet (20 mg total) by mouth daily. (Patient taking differently: Take 20 mg by mouth at bedtime. ) 90 tablet 3  . ibuprofen (ADVIL,MOTRIN) 200 MG tablet Take 400-800 mg by mouth every 6 (six) hours as needed for headache.    . lisinopril-hydrochlorothiazide (ZESTORETIC) 20-12.5 MG tablet Take 1 tablet by mouth daily. 1 tablet 00  . Omega-3 Fatty Acids (FISH OIL PO) Take 2,000 mg by mouth 2 (two) times daily.      No current facility-administered medications on file prior to visit.     ALLERGIES: Allergies  Allergen Reactions  . Sulfa Antibiotics Nausea And Vomiting    FAMILY HISTORY: Family History  Problem Relation Age of Onset  . Heart disease Father   . Breast cancer Maternal Grandmother 69    SOCIAL HISTORY: Social History   Socioeconomic History  . Marital status: Married    Spouse name: Not on file  . Number of children: Not on file  . Years of education: Not on file  . Highest education level: Not on file  Occupational History  . Not on file  Social Needs  . Financial resource strain: Not on file  . Food insecurity:    Worry: Not on file    Inability: Not on file  . Transportation needs:    Medical: Not on file    Non-medical: Not on file  Tobacco Use  . Smoking status: Current Every Day Smoker    Packs/day: 0.50    Years: 26.00    Pack years: 13.00    Types: Cigarettes  . Smokeless tobacco: Never Used  . Tobacco comment: has ordered patches  Substance and Sexual Activity  . Alcohol use: No  . Drug use: No  . Sexual activity: Yes  Lifestyle  . Physical activity:    Days per week: Not  on file    Minutes per session: Not on file  . Stress: Not on file  Relationships  . Social connections:    Talks on phone: Not on file    Gets together: Not on file    Attends religious service: Not on file    Active member of club or organization: Not on file    Attends meetings of clubs or organizations: Not on file    Relationship status: Not on file  . Intimate partner violence:    Fear of current or ex partner: Not on file    Emotionally abused: Not on file    Physically abused: Not on file    Forced sexual activity: Not on file  Other Topics Concern  . Not on file  Social History Narrative  . Not on file    REVIEW OF SYSTEMS: Constitutional: No fevers, chills, or sweats, no generalized fatigue, change in appetite Eyes: No visual  changes, double vision, eye pain Ear, nose and throat: No hearing loss, ear pain, nasal congestion, sore throat Cardiovascular: No chest pain, palpitations Respiratory:  No shortness of breath at rest or with exertion, wheezes GastrointestinaI: No nausea, vomiting, diarrhea, abdominal pain, fecal incontinence Genitourinary:  No dysuria, urinary retention or frequency Musculoskeletal:  No neck pain, back pain Integumentary: No rash, pruritus, skin lesions Neurological: as above Psychiatric: depression, insomnia, anxiety Endocrine: No palpitations, fatigue, diaphoresis, mood swings, change in appetite, change in weight, increased thirst Hematologic/Lymphatic:  No purpura, petechiae. Allergic/Immunologic: no itchy/runny eyes, nasal congestion, recent allergic reactions, rashes  PHYSICAL EXAM: Vitals:   12/31/17 0800  BP: (!) 96/54  Pulse: (!) 103  Resp: 16  SpO2: 97%   General: No acute distress.   Head:  Normocephalic/atraumatic Eyes:  fundi examined but not visualized Neck: supple, no paraspinal tenderness, full range of motion Back: No paraspinal tenderness Heart: regular rate and rhythm Lungs: Clear to auscultation  bilaterally. Vascular: No carotid bruits. Neurological Exam: Mental status: alert and oriented to person, place, and time, recent and remote memory intact, fund of knowledge intact, attention and concentration intact, speech fluent and not dysarthric, language intact. Cranial nerves: CN I: not tested CN II: pupils equal, round and reactive to light, visual fields intact CN III, IV, VI:  full range of motion, no nystagmus, no ptosis CN V: facial sensation intact CN VII: upper and lower face symmetric CN VIII: hearing intact CN IX, X: gag intact, uvula midline CN XI: sternocleidomastoid and trapezius muscles intact CN XII: tongue midline Bulk & Tone: normal, no fasciculations. Motor:  5/5 throughout Sensation:  Pinprick and vibration sensation intact. Deep Tendon Reflexes:  2+ throughout, toes downgoing.  Finger to nose testing:  Without dysmetria.  Heel to shin:  Without dysmetria.  Gait:  Wide-based gait.  Able to turn, unable to tandem walk. Romberg negative.  IMPRESSION: 1.  Stroke-like event.  An MRI-negative punctate thalamo-capsular stroke is possible.  However, I am suspicious about conversion disorder as well.  I do not think she had a seizure as such a prolonged Todd's paralysis is unlikely.  For the same reason, I don't think she had a complicated migraine/hemiplegic migraine.  Regardless, given her stroke risk factors, I would er on the side of caution and treat for secondary stroke prevention. 2.  Chronic migraine without aura, intractable, complicated by medication-overuse. 3.  Tobacco use disorder 4.  Hypertension 5.  Hyperlipidemia 6.  Depression and anxiety  PLAN: 1.  She will continue ASA 81mg  daily for secondary stroke prevention.  Continue statin therapy (LDL goal less than 70) and optimize blood pressure control (all managed by PCP). 2.  Start topiramate 50mg  at bedtime.  We can increase dose in 4 weeks if needed. 3.  Triptans are now contraindicated.  She will  stop ibuprofen due to rebound headache.  Instead, she will try tramadol for abortive therapy, limited to no more than 2 days out of week. 4.  Avoid caffeine, exercise, hydrate with water, provided instructions for sleep hygiene. 5.  Recommend counseling regarding depression and anxiety.  Consider antidepressant. 6.  Tobacco cessation counseling (CPT 99406):  Tobacco smoker with history of possible stroke, no CAD or cancer.  - Currently smoking 1 packs/day   - Patient was informed of the dangers of tobacco abuse including stroke, cancer, and MI, as well as benefits of tobacco cessation. - Patient is willing to quit at this time. - Approximately 4 mins were spent counseling patient cessation techniques.  We discussed various methods to help quit smoking, including deciding on a date to quit, joining a support group, pharmacological agents- nicotine gum/patch/lozenges, chantix.  - I will reassess her progress at the next follow-up visit  7.  Follow up in 3 months.  Thank you for allowing me to take part in the care of this patient.  Shon MilletAdam Jaffe, DO  CC:  Dominique HawkingShannon McElroy, PA-C

## 2017-12-31 NOTE — Patient Instructions (Addendum)
Migraine Recommendations: 1.  Start topiramate 50mg  at bedtime.  Call in 4 weeks with update and we can adjust dose if needed. 2.  Take tramadol 50mg  at earliest onset of headache.  My repeat once in 6 hours if needed. Use Phenergan for nausea.  3.  Stop ibuprofen.  Limit use of pain relievers to no more than 2 days out of the week.  These medications include acetaminophen, ibuprofen, triptans and narcotics.  This will help reduce risk of rebound headaches. 4.  Be aware of common food triggers such as processed sweets, processed foods with nitrites (such as deli meat, hot dogs, sausages), foods with MSG, alcohol (such as wine), chocolate, certain cheeses, certain fruits (dried fruits, bananas, some citrus fruit), vinegar, diet soda. 4.  Avoid caffeine 5.  Routine exercise 6.  Proper sleep hygiene 7.  Stay adequately hydrated with water 8.  Keep a headache diary. 9.  Maintain proper stress management. 10.  Do not skip meals. 11.  Consider supplements:  Magnesium citrate 400mg  to 600mg  daily, riboflavin 400mg , Coenzyme Q 10 100mg  three times daily 12.  Continue aspirin 81mg  daily, lipitor and blood pressure control 13.  Follow up in 3 months.

## 2018-01-24 ENCOUNTER — Encounter: Payer: Self-pay | Admitting: Neurology

## 2018-02-08 ENCOUNTER — Other Ambulatory Visit: Payer: Self-pay

## 2018-02-08 ENCOUNTER — Encounter (HOSPITAL_COMMUNITY): Payer: Self-pay | Admitting: *Deleted

## 2018-02-08 ENCOUNTER — Emergency Department (HOSPITAL_COMMUNITY)
Admission: EM | Admit: 2018-02-08 | Discharge: 2018-02-09 | Disposition: A | Discharge status: Home / Self Care | Payer: Self-pay | Attending: Emergency Medicine | Admitting: Emergency Medicine

## 2018-02-08 DIAGNOSIS — I10 Essential (primary) hypertension: Secondary | ICD-10-CM | POA: Insufficient documentation

## 2018-02-08 DIAGNOSIS — Z79899 Other long term (current) drug therapy: Secondary | ICD-10-CM | POA: Insufficient documentation

## 2018-02-08 DIAGNOSIS — G43001 Migraine without aura, not intractable, with status migrainosus: Secondary | ICD-10-CM

## 2018-02-08 DIAGNOSIS — Z7982 Long term (current) use of aspirin: Secondary | ICD-10-CM | POA: Insufficient documentation

## 2018-02-08 DIAGNOSIS — G43701 Chronic migraine without aura, not intractable, with status migrainosus: Secondary | ICD-10-CM | POA: Insufficient documentation

## 2018-02-08 DIAGNOSIS — Z8673 Personal history of transient ischemic attack (TIA), and cerebral infarction without residual deficits: Secondary | ICD-10-CM | POA: Insufficient documentation

## 2018-02-08 DIAGNOSIS — F17210 Nicotine dependence, cigarettes, uncomplicated: Secondary | ICD-10-CM | POA: Insufficient documentation

## 2018-02-08 DIAGNOSIS — R55 Syncope and collapse: Secondary | ICD-10-CM | POA: Insufficient documentation

## 2018-02-08 LAB — I-STAT BETA HCG BLOOD, ED (MC, WL, AP ONLY): I-stat hCG, quantitative: 6.3 m[IU]/mL — ABNORMAL HIGH (ref <5)

## 2018-02-08 LAB — CBG MONITORING, ED: GLUCOSE-CAPILLARY: 120 mg/dL — AB (ref 65–99)

## 2018-02-08 MED ORDER — DIPHENHYDRAMINE HCL 50 MG/ML IJ SOLN
25.0000 mg | Freq: Once | INTRAMUSCULAR | Status: AC
  Administered 2018-02-08: 25 mg via INTRAVENOUS
  Filled 2018-02-08: qty 1

## 2018-02-08 MED ORDER — KETOROLAC TROMETHAMINE 15 MG/ML IJ SOLN
15.0000 mg | Freq: Once | INTRAMUSCULAR | Status: AC
  Administered 2018-02-08: 15 mg via INTRAVENOUS
  Filled 2018-02-08: qty 1

## 2018-02-08 MED ORDER — METOCLOPRAMIDE HCL 5 MG/ML IJ SOLN
10.0000 mg | Freq: Once | INTRAMUSCULAR | Status: AC
  Administered 2018-02-08: 10 mg via INTRAVENOUS
  Filled 2018-02-08: qty 2

## 2018-02-08 NOTE — ED Triage Notes (Signed)
Pt's spouse states pt has a migraine x 3 days and states pt has not been acting like her normal self since then; spouse states pt passed out today and when she woke up she didn't know where she was; pt started on topiramate yesterday

## 2018-02-09 ENCOUNTER — Emergency Department (HOSPITAL_COMMUNITY)
Admission: EM | Admit: 2018-02-09 | Discharge: 2018-02-09 | Disposition: A | Discharge status: Home / Self Care | Payer: Self-pay | Attending: Emergency Medicine | Admitting: Emergency Medicine

## 2018-02-09 ENCOUNTER — Encounter (HOSPITAL_COMMUNITY): Payer: Self-pay | Admitting: Emergency Medicine

## 2018-02-09 ENCOUNTER — Other Ambulatory Visit: Payer: Self-pay

## 2018-02-09 ENCOUNTER — Emergency Department (HOSPITAL_COMMUNITY): Payer: Self-pay

## 2018-02-09 DIAGNOSIS — Z7982 Long term (current) use of aspirin: Secondary | ICD-10-CM | POA: Insufficient documentation

## 2018-02-09 DIAGNOSIS — R51 Headache: Secondary | ICD-10-CM | POA: Insufficient documentation

## 2018-02-09 DIAGNOSIS — Z79899 Other long term (current) drug therapy: Secondary | ICD-10-CM | POA: Insufficient documentation

## 2018-02-09 DIAGNOSIS — R519 Headache, unspecified: Secondary | ICD-10-CM

## 2018-02-09 DIAGNOSIS — R062 Wheezing: Secondary | ICD-10-CM | POA: Insufficient documentation

## 2018-02-09 DIAGNOSIS — F17210 Nicotine dependence, cigarettes, uncomplicated: Secondary | ICD-10-CM | POA: Insufficient documentation

## 2018-02-09 DIAGNOSIS — I10 Essential (primary) hypertension: Secondary | ICD-10-CM | POA: Insufficient documentation

## 2018-02-09 LAB — COMPREHENSIVE METABOLIC PANEL
ALBUMIN: 3.4 g/dL — AB (ref 3.5–5.0)
ALT: 17 U/L (ref 14–54)
ALT: 21 U/L (ref 14–54)
AST: 15 U/L (ref 15–41)
AST: 29 U/L (ref 15–41)
Albumin: 3.7 g/dL (ref 3.5–5.0)
Alkaline Phosphatase: 84 U/L (ref 38–126)
Alkaline Phosphatase: 96 U/L (ref 38–126)
Anion gap: 7 (ref 5–15)
Anion gap: 11 (ref 5–15)
BILIRUBIN TOTAL: 0.5 mg/dL (ref 0.3–1.2)
BILIRUBIN TOTAL: 0.4 mg/dL (ref 0.3–1.2)
BUN: 11 mg/dL (ref 6–20)
BUN: 14 mg/dL (ref 6–20)
CHLORIDE: 109 mmol/L (ref 101–111)
CO2: 25 mmol/L (ref 22–32)
CO2: 20 mmol/L — ABNORMAL LOW (ref 22–32)
Calcium: 8.9 mg/dL (ref 8.9–10.3)
Calcium: 9.3 mg/dL (ref 8.9–10.3)
Chloride: 106 mmol/L (ref 101–111)
Creatinine, Ser: 0.89 mg/dL (ref 0.44–1.00)
Creatinine, Ser: 0.83 mg/dL (ref 0.44–1.00)
GFR calc Af Amer: >60 mL/min (ref >60)
GFR calc Af Amer: >60 mL/min (ref >60)
GLUCOSE: 133 mg/dL — AB (ref 65–99)
Glucose, Bld: 112 mg/dL — ABNORMAL HIGH (ref 65–99)
POTASSIUM: 3.2 mmol/L — AB (ref 3.5–5.1)
Potassium: 3.7 mmol/L (ref 3.5–5.1)
Sodium: 141 mmol/L (ref 135–145)
Sodium: 137 mmol/L (ref 135–145)
TOTAL PROTEIN: 6.3 g/dL — AB (ref 6.5–8.1)
TOTAL PROTEIN: 6.8 g/dL (ref 6.5–8.1)

## 2018-02-09 LAB — CBC
HEMATOCRIT: 37.8 % (ref 36.0–46.0)
HEMATOCRIT: 38.9 % (ref 36.0–46.0)
HEMOGLOBIN: 12.3 g/dL (ref 12.0–15.0)
Hemoglobin: 13.1 g/dL (ref 12.0–15.0)
MCH: 32 pg (ref 26.0–34.0)
MCH: 32.3 pg (ref 26.0–34.0)
MCHC: 32.5 g/dL (ref 30.0–36.0)
MCHC: 33.7 g/dL (ref 30.0–36.0)
MCV: 98.4 fL (ref 78.0–100.0)
MCV: 96 fL (ref 78.0–100.0)
Platelets: 186 10*3/uL (ref 150–400)
Platelets: 221 10*3/uL (ref 150–400)
RBC: 3.84 MIL/uL — ABNORMAL LOW (ref 3.87–5.11)
RBC: 4.05 MIL/uL (ref 3.87–5.11)
RDW: 14.3 % (ref 11.5–15.5)
RDW: 13.9 % (ref 11.5–15.5)
WBC: 8 10*3/uL (ref 4.0–10.5)
WBC: 16.1 10*3/uL — AB (ref 4.0–10.5)

## 2018-02-09 LAB — CBG MONITORING, ED: Glucose-Capillary: 144 mg/dL — ABNORMAL HIGH (ref 65–99)

## 2018-02-09 MED ORDER — KETOROLAC TROMETHAMINE 15 MG/ML IJ SOLN
INTRAMUSCULAR | Status: AC
  Administered 2018-02-09: 30 mg
  Filled 2018-02-09: qty 2

## 2018-02-09 MED ORDER — PROMETHAZINE HCL 25 MG/ML IJ SOLN
25.0000 mg | Freq: Once | INTRAMUSCULAR | Status: AC
  Administered 2018-02-09: 25 mg via INTRAVENOUS
  Filled 2018-02-09: qty 1

## 2018-02-09 MED ORDER — LORAZEPAM 0.5 MG PO TABS: 0.5000 mg | ORAL_TABLET | Freq: Three times a day (TID) | ORAL | 0 refills | Status: DC | PRN

## 2018-02-09 MED ORDER — KETOROLAC TROMETHAMINE 30 MG/ML IJ SOLN: 15.0000 mg | Freq: Once | INTRAMUSCULAR | Status: DC

## 2018-02-09 MED ORDER — LORAZEPAM 2 MG/ML IJ SOLN
1.0000 mg | Freq: Once | INTRAMUSCULAR | Status: AC
  Administered 2018-02-09: 1 mg via INTRAVENOUS
  Filled 2018-02-09: qty 1

## 2018-02-09 MED ORDER — DEXAMETHASONE SODIUM PHOSPHATE 10 MG/ML IJ SOLN
10.0000 mg | Freq: Once | INTRAMUSCULAR | Status: AC
  Administered 2018-02-09: 10 mg via INTRAVENOUS
  Filled 2018-02-09: qty 1

## 2018-02-09 MED ORDER — SODIUM CHLORIDE 0.9 % IV BOLUS
1000.0000 mL | Freq: Once | INTRAVENOUS | Status: AC
  Administered 2018-02-09: 1000 mL via INTRAVENOUS

## 2018-02-09 MED ORDER — PROCHLORPERAZINE EDISYLATE 10 MG/2ML IJ SOLN
10.0000 mg | Freq: Once | INTRAMUSCULAR | Status: AC
  Administered 2018-02-09: 10 mg via INTRAVENOUS
  Filled 2018-02-09: qty 2

## 2018-02-09 MED ORDER — ALBUTEROL SULFATE (2.5 MG/3ML) 0.083% IN NEBU
5.0000 mg | INHALATION_SOLUTION | Freq: Once | RESPIRATORY_TRACT | Status: AC
  Administered 2018-02-09: 5 mg via RESPIRATORY_TRACT
  Filled 2018-02-09: qty 6

## 2018-02-09 MED ORDER — MAGNESIUM SULFATE IN D5W 1-5 GM/100ML-% IV SOLN
1.0000 g | Freq: Once | INTRAVENOUS | Status: AC
  Administered 2018-02-09: 1 g via INTRAVENOUS
  Filled 2018-02-09: qty 100

## 2018-02-09 MED ORDER — METOCLOPRAMIDE HCL 5 MG/ML IJ SOLN: 10.0000 mg | Freq: Once | INTRAMUSCULAR | Status: DC

## 2018-02-09 NOTE — ED Provider Notes (Signed)
[facility] EMERGENCY DEPARTMENT Provider Note   CSN: [id number] Arrival date & time: 02/09/18  1825     History   Chief Complaint Chief Complaint  Patient presents with  . Altered Mental Status    HPI Dominique Harrington is a 47 y.o. female.  HPI Patient presents for the second time in 2 days with concern of headache and confusion. Patient is here with her husband who assists with the HPI. It seems as though she has a headache for the last 3 or 4 days, including after discharge yesterday. Headache is left-sided, sore, severe. There is associated confusion, described by the husband as disorientation, and the patient herself is repetitive, stating that she cannot think correctly. Patient had some relief yesterday after receiving multiple medication as treatment in the emergency department, and it is unclear if she took additional medication today for symptom relief. She acknowledges a history of migraine headaches, as well as possible prior stroke.  Past Medical History:  Diagnosis Date  . Anxiety   . Dyspnea on exertion 2013   Echo, EF =>55%  . Hypertension   . Migraines   . Tachycardia   . TIA (transient ischemic attack)     Patient Active Problem List   Diagnosis Date Noted  . Sensory disturbance 09/14/2017  . Left hemiparesis (HCC) 09/14/2017  . Dysarthria   . TIA (transient ischemic attack) 09/12/2017  . Obesity, unspecified 11/11/2015  . Essential hypertension, benign 08/12/2015  . Hyperlipidemia 08/12/2015  . Cigarette nicotine dependence, uncomplicated 08/12/2015  . Headache, migraine 08/12/2015    Past Surgical History:  Procedure Laterality Date  . BACK SURGERY    . COSMETIC SURGERY    . HERNIA REPAIR    . TUBAL LIGATION  2004     OB History    Gravida  2   Para  2   Term  2   Preterm      AB      Living  2     SAB      TAB      Ectopic      Multiple      Live Births               Home Medications    Prior to Admission  medications   Medication Sig Start Date End Date Taking? Authorizing Provider  aspirin EC 81 MG tablet Take 81 mg by mouth daily.    [provider]  atorvastatin (LIPITOR) 20 MG tablet Take 1 tablet (20 mg total) by mouth daily. Patient taking differently: Take 20 mg by mouth at bedtime.  01/05/17   Jacquelin Hawking, PA-C  ibuprofen (ADVIL,MOTRIN) 200 MG tablet Take 400-800 mg by mouth every 6 (six) hours as needed for headache.    [provider]  lisinopril-hydrochlorothiazide (ZESTORETIC) 20-12.5 MG tablet Take 1 tablet by mouth daily. 09/23/17   Jacquelin Hawking, PA-C  Omega-3 Fatty Acids (FISH OIL PO) Take 2,000 mg by mouth 2 (two) times daily.     [provider]  promethazine (PHENERGAN) 25 MG tablet Take 1 tablet (25 mg total) by mouth every 6 (six) hours as needed for nausea or vomiting. 12/31/17   Drema Dallas, DO  topiramate (TOPAMAX) 50 MG tablet Take 1 tablet (50 mg total) by mouth at bedtime. 12/31/17   Drema Dallas, DO  traMADol (ULTRAM) 50 MG tablet Take 1 tablet (50 mg total) by mouth every 6 (six) hours as needed. 12/31/17   Drema Dallas,  DO    Family History Family History  Problem Relation Age of Onset  . Heart disease Father   . Breast cancer Maternal Grandmother 20    Social History Social History   Tobacco Use  . Smoking status: Current Every Day Smoker    Packs/day: 0.50    Years: 26.00    Pack years: 13.00    Types: Cigarettes  . Smokeless tobacco: Never Used  . Tobacco comment: has ordered patches  Substance Use Topics  . Alcohol use: No  . Drug use: No     Allergies   Sulfa antibiotics   Review of Systems Review of Systems  Constitutional:       Per HPI, otherwise negative  HENT:       Per HPI, otherwise negative  Respiratory:       Per HPI, otherwise negative  Cardiovascular:       Per HPI, otherwise negative  Gastrointestinal: Positive for nausea.  Endocrine:       Negative aside from HPI  Genitourinary:        Neg aside from HPI   Musculoskeletal:       Per HPI, otherwise negative  Skin: Negative.   Neurological: Positive for light-headedness and headaches. Negative for syncope.     Physical Exam Updated Vital Signs BP 131/64   Pulse 81   Temp 98.4 F (36.9 C)   Resp 19   Ht  (1.702 m)   Wt 108.9 kg (240 lb)   LMP 12/19/2014   SpO2 93%   BMI 37.59 kg/m   Physical Exam  Constitutional: She is oriented to person, place, and time. She appears well-developed and well-nourished.  Obese female repetitive, tremulous, but stops, when asked to participate in neurologic exam with eye motion, or during auscultation of her lungs, but is quick to return to tremor as soon as the specific actions are complete.  HENT:  Head: Normocephalic and atraumatic.  Eyes: Conjunctivae and EOM are normal.  Neck: No spinous process tenderness and no muscular tenderness present. No neck rigidity. No edema, no erythema and normal range of motion present.  Patient moves the neck freely in all dimensions, denies neck pain.  Cardiovascular: Normal rate and regular rhythm.  Pulmonary/Chest: She has wheezes.  Abdominal: She exhibits no distension.  Musculoskeletal: She exhibits no edema.  Neurological: She is alert and oriented to person, place, and time. She displays tremor. No cranial nerve deficit.  Patient does move all extremities spontaneously, and with direct command does move extremities to cooperate in neurologic exam.  Skin: Skin is warm and dry.  Psychiatric: Her mood appears anxious.  Nursing note and vitals reviewed.    ED Treatments / Results  Labs (all labs ordered are listed, but only abnormal results are displayed) Labs Reviewed  COMPREHENSIVE METABOLIC PANEL - Abnormal; Notable for the following components:      Result Value   CO2 20 (*)    Glucose, Bld 133 (*)    All other components within normal limits  CBC - Abnormal; Notable for the following components:   WBC 16.1 (*)    All  other components within normal limits  CBG MONITORING, ED - Abnormal; Notable for the following components:   Glucose-Capillary 144 (*)    All other components within normal limits    EKG EKG Interpretation  Date/Time:  Wednesday Feb 09 2018 18:46:48 EDT Ventricular Rate:  83 PR Interval:    QRS Duration: 86 QT Interval:  410 QTC Calculation:  482 R Axis:   66 Text Interpretation:  Sinus rhythm Artifact Baseline wander Otherwise within normal limits Confirmed by Gerhard Munch [phone]) on 02/09/2018 8:15:12 PM   Radiology Ct Head Wo Contrast  Result Date: 02/09/2018 CLINICAL DATA:  Larey Seat out of bed, altered LOC EXAM: CT HEAD WITHOUT CONTRAST TECHNIQUE: Contiguous axial images were obtained from the base of the skull through the vertex without intravenous contrast. COMPARISON:  10/07/2017 head CT FINDINGS: Brain: No evidence of acute infarction, hemorrhage, hydrocephalus, extra-axial collection or mass lesion/mass effect. Vascular: No hyperdense vessel or unexpected calcification. Skull: Normal. Negative for fracture or focal lesion. Sinuses/Orbits: No acute finding. Other: None IMPRESSION: Negative non contrasted CT appearance of the brain Electronically Signed   By: Jasmine Pang M.D.   On: 02/09/2018 20:50    Procedures Procedures (including critical care time)  Medications Ordered in ED Medications  ketorolac (TORADOL) 30 MG/ML injection 15 mg (15 mg Intravenous Not Given 02/09/18 1928)  sodium chloride 0.9 % bolus 1,000 mL (0 mLs Intravenous Stopped 02/09/18 2042)  prochlorperazine (COMPAZINE) injection 10 mg (10 mg Intravenous Given 02/09/18 1928)  ketorolac (TORADOL) 15 MG/ML injection (30 mg  Given 02/09/18 1928)  LORazepam (ATIVAN) injection 1 mg (1 mg Intravenous Given 02/09/18 2057)  albuterol (PROVENTIL) (2.5 MG/3ML) 0.083% nebulizer solution 5 mg (5 mg Nebulization Given 02/09/18 2054)     Initial Impression / Assessment and Plan / ED Course  I have reviewed the triage  vital signs and the nursing notes.  Pertinent labs & imaging results that were available during my care of the patient were reviewed by me and considered in my medical decision making (see chart for details).   Initial evaluation I reviewed the patient's chart clinic visit yesterday, and MRA MRI from December, 2018, which were unremarkable.  8:58 PM Patient anxious, sitting upright, moving all extremities spontaneously, speaking clearly. In trying to discern the actual time course the patient's illness, seems as though she has had similar episodes, of different severity going back possibly to December of last year when she was diagnosed with a possible stroke. She only recently began taking Topamax for migraines, seems as though she has taken this for the past 2 days. Initial findings are reassuring, though she has mild leukocytosis she is afebrile, she is moving her neck in all dimensions, and there is low suspicion for meningitis given the family's description of similar episodes going back months. In reviewing the patient's chart, including outpatient neurology assessment after her earlier admission for strokelike symptoms, from April of this year, the following impression is noted:  "IMPRESSION: 1.  Stroke-like event.  An MRI-negative punctate thalamo-capsular stroke is possible.  However, I am suspicious about conversion disorder as well.  I do not think she had a seizure as such a prolonged Todd's paralysis is unlikely. "  9:32 PM Patient awake, alert, sitting on the edge of her bed, speaking in a normal tone, demonstrating insight into today's presentation, this is been a notable change from her mental status on initial evaluation. With her husband we had a lengthy conversation about possibilities for her persistent headache, episodes of confusion, unsettled sensation. Patient's breathing is improved as well after breathing treatment. Given her substantial improvement here, though she does  have mild leukocytosis, she is afebrile, with no increased work of breathing, low suspicion for pneumonia  This patient with a history of migraines presents with headache, anxiety, wheezing. She is awake, alert, moving all extremities spontaneously, and after fluids, Compazine, Toradol, Ativan, and  a breathing treatment has resolution of essentially all of her complaints, is awake and alert, sitting on edge of her bed, with no neurologic complaints, nor abnormalities. Patient has no neck discomfort, is afebrile, and there is low suspicion for meningitis/encephalitis. No evidence for pneumonia clinically. Some suspicion for medication effects versus atypical migraine. Patient acknowledges importance of following up with her neurologist, and will call tomorrow.    Final Clinical Impressions(s) / ED Diagnoses  Bad headache Wheezing   Gerhard Munch, MD 02/09/18 2134

## 2018-02-09 NOTE — ED Triage Notes (Signed)
Pt brought in by family for AMS. Per family pt is confused, had fallen out of bed yesterday and was in the floor and thought she was still in the bed. LWK 3 days per family. Stroke in December and complex seizure. Pt unable to recall the year or month correctly.

## 2018-02-09 NOTE — ED Provider Notes (Addendum)
[facility] EMERGENCY DEPARTMENT Provider Note   CSN: [id number] Arrival date & time: 02/08/18  2309     History   Chief Complaint Chief Complaint  Patient presents with  . Altered Mental Status    HPI BETSI CRESPI is a 47 y.o. female.  HPI 47 year old female comes in with chief complaint of altered mental status. Patient has history of hypertension, migraine, TIA. According to the patient she has been having headaches for the last 3 days.  Her headaches are typical of her migraines and located over the frontal part of her head, radiating to the back.  Patient's pain is throbbing and she has both photo and phonophobia.  Patient also has associated nausea.  Patient was prescribed Topamax a month ago and patient had stopped taking it, but with her headache she started taking Topamax again and the family noted that patient was getting more confused starting last night.  This morning patient had a syncopal episode followed by confusion.  Patient also could not recallany events that transpired before she fainted.  Patient had a similar episode in December when she was admitted for TIA and was informed that she might have had a stroke.  Patient is now AAO x3.   Past Medical History:  Diagnosis Date  . Anxiety   . Dyspnea on exertion 2013   Echo, EF =>55%  . Hypertension   . Migraines   . Tachycardia   . TIA (transient ischemic attack)     Patient Active Problem List   Diagnosis Date Noted  . Sensory disturbance 09/14/2017  . Left hemiparesis (HCC) 09/14/2017  . Dysarthria   . TIA (transient ischemic attack) 09/12/2017  . Obesity, unspecified 11/11/2015  . Essential hypertension, benign 08/12/2015  . Hyperlipidemia 08/12/2015  . Cigarette nicotine dependence, uncomplicated 08/12/2015  . Headache, migraine 08/12/2015    Past Surgical History:  Procedure Laterality Date  . BACK SURGERY    . COSMETIC SURGERY    . HERNIA REPAIR    . TUBAL LIGATION  2004     OB History     Gravida  2   Para  2   Term  2   Preterm      AB      Living  2     SAB      TAB      Ectopic      Multiple      Live Births               Home Medications    Prior to Admission medications   Medication Sig Start Date End Date Taking? Authorizing Provider  aspirin EC 81 MG tablet Take 81 mg by mouth daily.    [provider]  atorvastatin (LIPITOR) 20 MG tablet Take 1 tablet (20 mg total) by mouth daily. Patient taking differently: Take 20 mg by mouth at bedtime.  01/05/17   Jacquelin Hawking, PA-C  ibuprofen (ADVIL,MOTRIN) 200 MG tablet Take 400-800 mg by mouth every 6 (six) hours as needed for headache.    [provider]  lisinopril-hydrochlorothiazide (ZESTORETIC) 20-12.5 MG tablet Take 1 tablet by mouth daily. 09/23/17   Jacquelin Hawking, PA-C  Omega-3 Fatty Acids (FISH OIL PO) Take 2,000 mg by mouth 2 (two) times daily.     [provider]  promethazine (PHENERGAN) 25 MG tablet Take 1 tablet (25 mg total) by mouth every 6 (six) hours as needed for nausea or vomiting. 12/31/17   Drema Dallas, DO  topiramate (TOPAMAX) 50 MG tablet Take 1 tablet (50 mg total) by mouth at bedtime. 12/31/17   Drema Dallas, DO  traMADol (ULTRAM) 50 MG tablet Take 1 tablet (50 mg total) by mouth every 6 (six) hours as needed. 12/31/17   Drema Dallas, DO    Family History Family History  Problem Relation Age of Onset  . Heart disease Father   . Breast cancer Maternal Grandmother 68    Social History Social History   Tobacco Use  . Smoking status: Current Every Day Smoker    Packs/day: 0.50    Years: 26.00    Pack years: 13.00    Types: Cigarettes  . Smokeless tobacco: Never Used  . Tobacco comment: has ordered patches  Substance Use Topics  . Alcohol use: No  . Drug use: No     Allergies   Sulfa antibiotics   Review of Systems Review of Systems  Constitutional: Positive for activity change.  Respiratory: Negative for shortness of  breath.   Cardiovascular: Negative for chest pain.  Gastrointestinal: Positive for nausea.  Musculoskeletal: Negative for back pain.  Neurological: Positive for syncope and headaches.  All other systems reviewed and are negative.    Physical Exam Updated Vital Signs BP (!) 145/99   Pulse 71   Resp 19   Ht  (1.702 m)   Wt 108.9 kg (240 lb)   LMP 12/19/2014   SpO2 97%   BMI 37.59 kg/m   Physical Exam  Constitutional: She is oriented to person, place, and time. She appears well-developed.  HENT:  Head: Normocephalic and atraumatic.  Eyes: EOM are normal.  Neck: Normal range of motion. Neck supple.  Cardiovascular: Normal rate.  Pulmonary/Chest: Effort normal.  Abdominal: Bowel sounds are normal.  Neurological: She is alert and oriented to person, place, and time. No cranial nerve deficit. Coordination normal.  Cerebellar exam is normal (finger to nose) Sensory exam normal for bilateral upper and lower extremities - and patient is able to discriminate between sharp and dull. Motor exam is 4+/5   Skin: Skin is warm and dry.  Nursing note and vitals reviewed.    ED Treatments / Results  Labs (all labs ordered are listed, but only abnormal results are displayed) Labs Reviewed  COMPREHENSIVE METABOLIC PANEL - Abnormal; Notable for the following components:      Result Value   Potassium 3.2 (*)    Glucose, Bld 112 (*)    Total Protein 6.3 (*)    Albumin 3.4 (*)    All other components within normal limits  CBC - Abnormal; Notable for the following components:   RBC 3.84 (*)    All other components within normal limits  CBG MONITORING, ED - Abnormal; Notable for the following components:   Glucose-Capillary 120 (*)    All other components within normal limits  I-STAT BETA HCG BLOOD, ED ([facility], [facility], [facility] ONLY) - Abnormal; Notable for the following components:   I-stat hCG, quantitative 6.3 (*)    All other components within normal limits    EKG EKG  Interpretation  Date/Time:  Tuesday Feb 08 2018 23:21:43 EDT Ventricular Rate:  69 PR Interval:    QRS Duration: 95 QT Interval:  432 QTC Calculation: 463 R Axis:   67 Text Interpretation:  Sinus rhythm Ventricular premature complex Low voltage, precordial leads No acute changes Nonspecific ST and T wave abnormality Confirmed by Derwood Kaplan [phone]) on 02/08/2018 11:29:27 PM   Radiology No results found.  Procedures  Procedures (including critical care time)  Medications Ordered in ED Medications  metoCLOPramide (REGLAN) injection 10 mg (10 mg Intravenous Given 02/08/18 2342)  ketorolac (TORADOL) 15 MG/ML injection 15 mg (15 mg Intravenous Given 02/08/18 2342)  diphenhydrAMINE (BENADRYL) injection 25 mg (25 mg Intravenous Given 02/08/18 2342)  magnesium sulfate IVPB 1 g 100 mL (0 g Intravenous Stopped 02/09/18 0233)  dexamethasone (DECADRON) injection 10 mg (10 mg Intravenous Given 02/09/18 0131)  promethazine (PHENERGAN) injection 25 mg (25 mg Intravenous Given 02/09/18 0131)     Initial Impression / Assessment and Plan / ED Course  I have reviewed the triage vital signs and the nursing notes.  Pertinent labs & imaging results that were available during my care of the patient were reviewed by me and considered in my medical decision making (see chart for details).  Clinical Course as of Feb 09 305  Wed Feb 09, 2018  7829 Patient reassessed. Pt is comfortable at this time. Shw wasn't headache free after just toradol and reglan, so we gave her decadron and promethazine - and it helped.  Results of the workup discussed. Strict ER return precautions discussed. Follow up instruction discussed, and pt agrees with the plan and is comfortable with it.    [AN]    Clinical Course User Index [AN] Derwood Kaplan, MD    47 year old female comes in with chief complaint of headache and syncope.  Patient has history of migraine headache and her current headache is similar to her  migrainous attacks.  Headache is also typical of migraine headaches we will treat her with headache cocktail.  In addition she had a syncopal episode followed by confusion earlier today.  Patient now is AO x3 and her neuro exam is nonfocal.  Patient is a smoker and has history of TIA.  ABCD 2 score is 2 -for the duration of the symptoms.  I doubt the patient had a TIA-stroke, syncope seizure could be possible, however no seizure-like activity was actually noted.  Other possibility could be that this is a complex migraine causing the confusion and fainting.  Patient had a visit in January for syncope and headache as well.  MRI, MRA have been reviewed along with echocardiogram which was completed in 2018 -all of them are reassuring.  Plan is to observe patient in the ED and see if he can get the headache in better control.    Final Clinical Impressions(s) / ED Diagnoses   Final diagnoses:  Migraine without aura and with status migrainosus, not intractable  Syncope and collapse    ED Discharge Orders    None       Derwood Kaplan, MD 02/09/18 5621    Derwood Kaplan, MD 02/09/18 [phone]

## 2018-02-09 NOTE — Discharge Instructions (Addendum)
As discussed, your evaluation today has been largely reassuring.  But, it is important that you monitor your condition carefully, and do not hesitate to return to the ED if you develop new, or concerning changes in your condition. ? ?Otherwise, please follow-up with your physician for appropriate ongoing care. ? ?

## 2018-02-09 NOTE — Discharge Instructions (Signed)
Please see her primary care doctor or the neurologist for optimal management of your migraine, and to see if you need further work-up for fainting.  Results in the ER reassuring and thankfully you are able to get your pain in better control.

## 2018-02-10 ENCOUNTER — Ambulatory Visit: Payer: Self-pay | Admitting: Physician Assistant

## 2018-02-10 ENCOUNTER — Telehealth: Payer: Self-pay

## 2018-02-10 ENCOUNTER — Encounter: Payer: Self-pay | Admitting: Physician Assistant

## 2018-02-10 VITALS — BP 144/73 | HR 78 | Temp 98.6°F | Ht 67.0 in | Wt 283.0 lb

## 2018-02-10 DIAGNOSIS — I10 Essential (primary) hypertension: Secondary | ICD-10-CM

## 2018-02-10 DIAGNOSIS — R062 Wheezing: Secondary | ICD-10-CM

## 2018-02-10 DIAGNOSIS — F32A Depression, unspecified: Secondary | ICD-10-CM

## 2018-02-10 DIAGNOSIS — F419 Anxiety disorder, unspecified: Secondary | ICD-10-CM

## 2018-02-10 DIAGNOSIS — F329 Major depressive disorder, single episode, unspecified: Secondary | ICD-10-CM

## 2018-02-10 DIAGNOSIS — F17219 Nicotine dependence, cigarettes, with unspecified nicotine-induced disorders: Secondary | ICD-10-CM

## 2018-02-10 MED ORDER — ALBUTEROL SULFATE (2.5 MG/3ML) 0.083% IN NEBU: 2.5000 mg | INHALATION_SOLUTION | Freq: Four times a day (QID) | RESPIRATORY_TRACT | 1 refills | Status: DC | PRN

## 2018-02-10 NOTE — Patient Instructions (Signed)
[phone] Financial counselor

## 2018-02-10 NOTE — Progress Notes (Signed)
BP (!) 144/73 (BP Location: Right Arm, Patient Position: Sitting, Cuff Size: Large)   Pulse 78   Temp 98.6 F (37 C) (Other (Comment))   Ht  (1.702 m)   Wt 283 lb (128.4 kg)   LMP 12/19/2014   SpO2 97%   BMI 44.32 kg/m    Subjective:    Patient ID: Dominique Harrington, female    DOB: January 25, 1971, 47 y.o.   MRN: [id number]  HPI: Dominique Harrington is a 47 y.o. female presenting on 02/10/2018 for Follow-up (from ER)   HPI Notes from ER on 5/14 and 5/16 reviewed.  Pt's husband is with her today.    Pt complains of having confusion and trouble with memory.  Also complains of problems breathing.  Husband says he took away her cigarettes  Pt just started the topamaz on the 13th of this month "and then things started going downhill".   That ws the 2nd bottle and she had no problems with the first bottle.  She hasn't gotten the ativan rx filled yet.    Relevant past medical, surgical, family and social history reviewed and updated as indicated. Interim medical history since our last visit reviewed. Allergies and medications reviewed and updated.   Current Outpatient Medications:  .  aspirin EC 81 MG tablet, Take 81 mg by mouth daily., Disp: , Rfl:  .  atorvastatin (LIPITOR) 20 MG tablet, Take 1 tablet (20 mg total) by mouth daily. (Patient taking differently: Take 20 mg by mouth at bedtime. ), Disp: 90 tablet, Rfl: 3 .  ibuprofen (ADVIL,MOTRIN) 200 MG tablet, Take 400-800 mg by mouth every 6 (six) hours as needed for headache., Disp: , Rfl:  .  lisinopril-hydrochlorothiazide (ZESTORETIC) 20-12.5 MG tablet, Take 1 tablet by mouth daily., Disp: 1 tablet, Rfl: 00 .  Omega-3 Fatty Acids (FISH OIL PO), Take 2,000 mg by mouth 2 (two) times daily. , Disp: , Rfl:  .  promethazine (PHENERGAN) 25 MG tablet, Take 1 tablet (25 mg total) by mouth every 6 (six) hours as needed for nausea or vomiting., Disp: 30 tablet, Rfl: 2 .  traMADol (ULTRAM) 50 MG tablet, Take 1 tablet (50 mg total) by mouth every  6 (six) hours as needed., Disp: 16 tablet, Rfl: 2 .  LORazepam (ATIVAN) 0.5 MG tablet, Take 1 tablet (0.5 mg total) by mouth every 8 (eight) hours as needed for anxiety. (Patient not taking: Reported on 02/10/2018), Disp: 20 tablet, Rfl: 0 .  topiramate (TOPAMAX) 50 MG tablet, Take 1 tablet (50 mg total) by mouth at bedtime. (Patient not taking: Reported on 02/10/2018), Disp: 30 tablet, Rfl: 2   Review of Systems  Constitutional: Positive for appetite change, chills, diaphoresis and fatigue. Negative for fever and unexpected weight change.  HENT: Positive for congestion, dental problem, sneezing and voice change. Negative for drooling, ear pain, facial swelling, hearing loss, mouth sores, sore throat and trouble swallowing.   Eyes: Positive for itching and visual disturbance. Negative for pain, discharge and redness.  Respiratory: Positive for cough, choking, shortness of breath and wheezing.   Cardiovascular: Positive for chest pain. Negative for palpitations and leg swelling.  Gastrointestinal: Positive for diarrhea. Negative for abdominal pain, blood in stool, constipation and vomiting.  Endocrine: Positive for cold intolerance, heat intolerance and polydipsia.  Genitourinary: Negative for decreased urine volume, dysuria and hematuria.  Musculoskeletal: Positive for gait problem. Negative for arthralgias and back pain.  Skin: Positive for rash.  Allergic/Immunologic: Negative for environmental allergies.  Neurological: Positive for  syncope and headaches. Negative for seizures and light-headedness.  Hematological: Negative for adenopathy.  Psychiatric/Behavioral: Positive for agitation and dysphoric mood. Negative for suicidal ideas. The patient is nervous/anxious.     Per HPI unless specifically indicated above     Objective:    BP (!) 144/73 (BP Location: Right Arm, Patient Position: Sitting, Cuff Size: Large)   Pulse 78   Temp 98.6 F (37 C) (Other (Comment))   Ht  (1.702 m)    Wt 283 lb (128.4 kg)   LMP 12/19/2014   SpO2 97%   BMI 44.32 kg/m   Wt Readings from Last 3 Encounters:  02/10/18 283 lb (128.4 kg)  02/09/18 240 lb (108.9 kg)  02/08/18 240 lb (108.9 kg)    Physical Exam  Constitutional: She is oriented to person, place, and time. She appears well-developed and well-nourished.  HENT:  Head: Normocephalic and atraumatic.  Neck: Neck supple.  Cardiovascular: Normal rate, regular rhythm and normal heart sounds.  Pulmonary/Chest: Effort normal. No respiratory distress. She has wheezes. She has no rales.  Abdominal: Soft. There is no tenderness.  Musculoskeletal: She exhibits no edema.  Neurological: She is alert and oriented to person, place, and time.  Exam difficult because pt physical symptoms worsen when she is focused on them and improve when she is distracted, like when she is focused on telling her husband something.  She is tearful, violently so at times.  She responds to examiner appropriately at all times and is cooperative.  She is ambulating with assistance of rolling walker today.   Skin: Skin is warm and dry.  Psychiatric: Her mood appears anxious. She is agitated and hyperactive. She exhibits a depressed mood.  Nursing note and vitals reviewed.       Assessment & Plan:    Encounter Diagnoses  Name Primary?  Marland Kitchen Anxiety Yes  . Wheezing   . Cigarette nicotine dependence with nicotine-induced disorder   . Essential hypertension, benign   . Depression, unspecified depression type     -Pt urged to go to walk-in at [facility] tomorrow morning.  Pt is very obviously anxious.  Agree that some of her issues may be conversion disorder.  Pt was treated at [facility] in years past and is in need of re-establishing care with MH specialist -discussed with pt that she has follow up scheduled with neurology in 2 months and that is appropriate.  There is nothing seen today or in the ER notes from this week that would indicate a need to be seen  sooner -encouraged pt to get her ativan rx filled (that she was given in the ER) -pt was given rx for albuterol for nebulizer.  Counseled pt to avoid overuse as it can increase anxiety.  Counseled pt to avoid smoking -discussed with pt and her husband that her ativan should be kept safe to avoid taking too many by pt (since she says she forgets) and to avoid them being stolen by others in the home.  Husband says he will lock them in the safe that only he has the combination to.  Pt happy with this plan -pt to return for her regularly scheduled appt on 03/03/18.  RTO sooner prn

## 2018-02-10 NOTE — Telephone Encounter (Signed)
Opened in error

## 2018-03-03 ENCOUNTER — Encounter: Payer: Self-pay | Admitting: Neurology

## 2018-03-03 ENCOUNTER — Ambulatory Visit (INDEPENDENT_AMBULATORY_CARE_PROVIDER_SITE_OTHER): Payer: Self-pay | Admitting: Neurology

## 2018-03-03 ENCOUNTER — Ambulatory Visit: Payer: Self-pay | Admitting: Physician Assistant

## 2018-03-03 VITALS — BP 88/72 | HR 112 | Ht 67.0 in | Wt 278.0 lb

## 2018-03-03 DIAGNOSIS — I639 Cerebral infarction, unspecified: Secondary | ICD-10-CM

## 2018-03-03 DIAGNOSIS — I959 Hypotension, unspecified: Secondary | ICD-10-CM

## 2018-03-03 DIAGNOSIS — G43719 Chronic migraine without aura, intractable, without status migrainosus: Secondary | ICD-10-CM

## 2018-03-03 DIAGNOSIS — F32A Depression, unspecified: Secondary | ICD-10-CM

## 2018-03-03 DIAGNOSIS — R2990 Unspecified symptoms and signs involving the nervous system: Secondary | ICD-10-CM

## 2018-03-03 DIAGNOSIS — F419 Anxiety disorder, unspecified: Secondary | ICD-10-CM

## 2018-03-03 DIAGNOSIS — F329 Major depressive disorder, single episode, unspecified: Secondary | ICD-10-CM

## 2018-03-03 MED ORDER — AMITRIPTYLINE HCL 10 MG PO TABS: 10.0000 mg | ORAL_TABLET | Freq: Every day | ORAL | 3 refills | Status: DC

## 2018-03-03 NOTE — Addendum Note (Signed)
Addended by: Dorthy CoolerBURNS, Thia Olesen J on: 03/03/2018 02:12 PM   Modules accepted: Orders

## 2018-03-03 NOTE — Progress Notes (Signed)
NEUROLOGY FOLLOW UP OFFICE NOTE  [name] [id number]  HISTORY OF PRESENT ILLNESS: Dominique Harrington is a 47 year old right-handed female with hypertension, hyperlipidemia, tobacco use, depression and migraines who follows up for migraines.  She is accompanied by her husband who supplements history.  ED notes reviewed.  UPDATE: Last visit, she was started on topiramate 50mg .  Ibuprofen was stopped due to medication overuse.  She was prescribed tramadol for abortive therapy.  She presented to the ED on 02/08/18 for intractable migraine and confusion.  She exhibited no lateralizing symptoms and was alert and oriented x 3.  She was discharged but returned the next day for continued headache and confusion.  CT head was unremarkable.  She was treated with headache cocktail and discontinued topiramate.  She hasn't had any more confusion.  Migraines continue, severe, lasting 1 to 3 days and occurring 1 to 2 times a week.   Current NSAIDS:  ASA 81mg  (secondary stroke prevention) Current analgesics:  tramadol 50mg  (helps) Current triptans:  no Current anti-emetic:  Promethazine 25mg  Current muscle relaxants:  no Current anti-anxiolytic:  no Current sleep aide:  no Current Antihypertensive medications:  lisinopril-HCTZ Current Antidepressant medications:  no Current Anticonvulsant medications:  no Current Vitamins/Herbal/Supplements:  no Current Antihistamines/Decongestants:  no Other therapy:  no  Caffeine:  caffeinated soda daily Alcohol:  no Smoker:  Yes.  Currently trying to quit with patch Diet:  Does not hydrate enough Exercise:  Not routine Depression:  yes; Anxiety:  yes Other pain:  no Sleep hygiene:  poor  HISTORY:   She was admitted to [facility] from 09/12/17 to 09/14/17 after waking up with dysarthria and left sided numbness of face, arm and leg and weakness of arm and leg.  Symptoms mostly resolved in hospital but had some residual weakness.  She underwent a  stroke workup.  MRI of brain was negative for acute abnormalities.  MRA of head demonstrated no large vessel occlusion or stenosis.  Carotid doppler revealed no hemodynamically significant ICA stenosis.  Echocardiogram showed EF 55-60% with no cardiac source of emboli.  In-house neurology had no explanation for her symptoms.  She was started on ASA 162mg  daily.  She was discharged and had an outpatient EEG on 09/16/17 which was normal.  She had been going to outpatient rehab until mid January.  She began experiencing dizziness with lightheadedness.  She reported that her blood pressure had been fluctuating.  She presented to the ED at [facility] where she had a witnessed syncopal episode.  When she regained consciousness, she reported that she felt she was getting a migraine.  She denied palpitations.  EKG was normal.  CT of head was negative.  Her PCP discontinued her metoprolol.  She has been ambulating with a walker and sometimes a cane.  She still feels dizzy at times.  Blood pressure has reportedly fluctuated as high as 180s/130s.  She reports several falls.  She reports significant anxiety and depression.  She is a smoker and currently trying to quit with the patch.   She has history of migraines since her early 27s.  They became worse after the birth of her daughter in her late 54s and second daughter in her 105s.  They are bifrontal radiating to the back of head and base of neck.  It is a severe stabbing pain that lasts 4 to 5 days and occurs every week.  There is associated nausea, photophobia.  There is no associated unilateral numbness, weakness,  or visual disturbance.     Past NSAIDS:  Aleve, ibuprofen 400-800mg  (overuse) Past analgesics:  Fioricet Past abortive triptans:  Maxalt 10mg  Past muscle relaxants:  Flexeril Past anti-emetic:  Reglan 10mg , Phenergan 25mg  Past antihypertensive medications:  metoprolol 100mg  twice daly Past antidepressant medications:  Amitriptyline (ineffective),  citalopram 20mg  Past anticonvulsant medications:  no Past vitamins/Herbal/Supplements:  no Past antihistamines/decongestants:  no Other past therapies:  no   Family history of headache:  No.  No known family history of stroke at young age. No history of seizures or head injury.  PAST MEDICAL HISTORY: Past Medical History:  Diagnosis Date  . Anxiety   . Dyspnea on exertion 2013   Echo, EF =>55%  . Hypertension   . Migraines   . Tachycardia   . TIA (transient ischemic attack)     MEDICATIONS: Current Outpatient Medications on File Prior to Visit  Medication Sig Dispense Refill  . albuterol (PROVENTIL) (2.5 MG/3ML) 0.083% nebulizer solution Take 3 mLs (2.5 mg total) by nebulization every 6 (six) hours as needed for wheezing or shortness of breath. 150 mL 1  . aspirin EC 81 MG tablet Take 81 mg by mouth daily.    Marland Kitchen atorvastatin (LIPITOR) 20 MG tablet Take 1 tablet (20 mg total) by mouth daily. (Patient taking differently: Take 20 mg by mouth at bedtime. ) 90 tablet 3  . ibuprofen (ADVIL,MOTRIN) 200 MG tablet Take 400-800 mg by mouth every 6 (six) hours as needed for headache.    . lisinopril-hydrochlorothiazide (ZESTORETIC) 20-12.5 MG tablet Take 1 tablet by mouth daily. 1 tablet 00  . LORazepam (ATIVAN) 0.5 MG tablet Take 1 tablet (0.5 mg total) by mouth every 8 (eight) hours as needed for anxiety. (Patient not taking: Reported on 02/10/2018) 20 tablet 0  . Omega-3 Fatty Acids (FISH OIL PO) Take 2,000 mg by mouth 2 (two) times daily.     . promethazine (PHENERGAN) 25 MG tablet Take 1 tablet (25 mg total) by mouth every 6 (six) hours as needed for nausea or vomiting. 30 tablet 2  . topiramate (TOPAMAX) 50 MG tablet Take 1 tablet (50 mg total) by mouth at bedtime. (Patient not taking: Reported on 02/10/2018) 30 tablet 2  . traMADol (ULTRAM) 50 MG tablet Take 1 tablet (50 mg total) by mouth every 6 (six) hours as needed. 16 tablet 2   No current facility-administered medications on file  prior to visit.     ALLERGIES: Allergies  Allergen Reactions  . Sulfa Antibiotics Nausea And Vomiting    FAMILY HISTORY: Family History  Problem Relation Age of Onset  . Heart disease Father   . Breast cancer Maternal Grandmother 30    SOCIAL HISTORY: Social History   Socioeconomic History  . Marital status: Married    Spouse name: Not on file  . Number of children: Not on file  . Years of education: Not on file  . Highest education level: Not on file  Occupational History  . Not on file  Social Needs  . Financial resource strain: Not on file  . Food insecurity:    Worry: Not on file    Inability: Not on file  . Transportation needs:    Medical: Not on file    Non-medical: Not on file  Tobacco Use  . Smoking status: Current Every Day Smoker    Packs/day: 0.50    Years: 26.00    Pack years: 13.00    Types: Cigarettes  . Smokeless tobacco: Never Used  Substance and  Sexual Activity  . Alcohol use: No  . Drug use: No  . Sexual activity: Yes  Lifestyle  . Physical activity:    Days per week: Not on file    Minutes per session: Not on file  . Stress: Not on file  Relationships  . Social connections:    Talks on phone: Not on file    Gets together: Not on file    Attends religious service: Not on file    Active member of club or organization: Not on file    Attends meetings of clubs or organizations: Not on file    Relationship status: Not on file  . Intimate partner violence:    Fear of current or ex partner: Not on file    Emotionally abused: Not on file    Physically abused: Not on file    Forced sexual activity: Not on file  Other Topics Concern  . Not on file  Social History Narrative  . Not on file    REVIEW OF SYSTEMS: Constitutional: No fevers, chills, or sweats, no generalized fatigue, change in appetite Eyes: No visual changes, double vision, eye pain Ear, nose and throat: No hearing loss, ear pain, nasal congestion, sore  throat Cardiovascular: No chest pain, palpitations Respiratory:  No shortness of breath at rest or with exertion, wheezes GastrointestinaI: No nausea, vomiting, diarrhea, abdominal pain, fecal incontinence Genitourinary:  No dysuria, urinary retention or frequency Musculoskeletal:  No neck pain, back pain Integumentary: No rash, pruritus, skin lesions Neurological: as above Psychiatric: depression, anxiety Endocrine: No palpitations, fatigue, diaphoresis, mood swings, change in appetite, change in weight, increased thirst Hematologic/Lymphatic:  No purpura, petechiae. Allergic/Immunologic: no itchy/runny eyes, nasal congestion, recent allergic reactions, rashes  PHYSICAL EXAM: Vitals:   03/03/18 1038  BP: (!) 88/72  Pulse: (!) 112  SpO2: 97%   General: No acute distress.  Patient appears well-groomed.  Head:  Normocephalic/atraumatic Eyes:  Fundi examined but not visualized Neck: supple, no paraspinal tenderness, full range of motion Heart:  Regular rate and rhythm Lungs:  Clear to auscultation bilaterally Back: No paraspinal tenderness Neurological Exam: .alert and oriented to person, place, and time. Attention span and concentration intact, recent and remote memory intact, fund of knowledge intact.  Speech fluent and not dysarthric, language intact.  CN II-XII intact. Bulk and tone normal, muscle strength 5/5 throughout.  Sensation to light touch  intact.  Deep tendon reflexes 2+ throughout.  Finger to nose testing intact.  Gait unsteady, using cane.  Romberg negative.  IMPRESSION: 1.  Chronic migraine without aura, not intractable 2.  Stroke-like event.  An MRI-negative punctate thalamo-capsular stroke is possible.  However, I am suspicious for conversion disorder.  I do not think she had a seizure as such a prolonged Todd's paralysis is unlikely.  For the same reason, I don't think she had a complicated migraine/hemiplegic migraine.  Regardless, given her stroke risk factors, I  would er on the side of caution and treat for secondary stroke prevention. 3.  Hypotension.  Blood pressure is unusually low today 4.  Depression and anxiety.  PLAN: 1.  Stop topiramate.  Start amitriptyline 10mg  at bedtime.  Contact me in 4 weeks with update and we can increase dose if needed. 2.  Stop tramadol (as we are starting a TCA).  Instead, take Excedrin Tension.  Limit use to no more than 2 days out of week to prevent rebound headache 3.  Keep headache diary 4.  Follow up in 3 months.  Madelaine BhatAdam  Everlena Cooper, DO  CC: Jacquelin Hawking, PA-C

## 2018-03-03 NOTE — Patient Instructions (Signed)
1.  Stop topiramate.  Start amitriptyline 10mg  at bedtime.  Contact me in 4 weeks with update and we can increase dose if needed. 2.  Stop tramadol.  Instead, take Excedrin Tension.  Limit use to no more than 2 days out of week to prevent rebound headache 3.  Keep headache diary 4.  Follow up in 3 months.

## 2018-03-09 ENCOUNTER — Encounter: Payer: Self-pay | Admitting: Physician Assistant

## 2018-03-09 ENCOUNTER — Other Ambulatory Visit (HOSPITAL_COMMUNITY)
Admission: RE | Admit: 2018-03-09 | Discharge: 2018-03-09 | Disposition: A | Discharge status: Home / Self Care | Payer: Self-pay | Source: Ambulatory Visit | Attending: Physician Assistant | Admitting: Physician Assistant

## 2018-03-09 ENCOUNTER — Other Ambulatory Visit: Payer: Self-pay | Admitting: Physician Assistant

## 2018-03-09 ENCOUNTER — Ambulatory Visit: Payer: Self-pay | Admitting: Physician Assistant

## 2018-03-09 VITALS — BP 149/104 | HR 95 | Temp 97.7°F | Ht 67.0 in | Wt 280.5 lb

## 2018-03-09 DIAGNOSIS — F32A Depression, unspecified: Secondary | ICD-10-CM

## 2018-03-09 DIAGNOSIS — E785 Hyperlipidemia, unspecified: Secondary | ICD-10-CM

## 2018-03-09 DIAGNOSIS — I10 Essential (primary) hypertension: Secondary | ICD-10-CM

## 2018-03-09 DIAGNOSIS — G43909 Migraine, unspecified, not intractable, without status migrainosus: Secondary | ICD-10-CM

## 2018-03-09 DIAGNOSIS — F329 Major depressive disorder, single episode, unspecified: Secondary | ICD-10-CM

## 2018-03-09 DIAGNOSIS — F17219 Nicotine dependence, cigarettes, with unspecified nicotine-induced disorders: Secondary | ICD-10-CM

## 2018-03-09 DIAGNOSIS — F419 Anxiety disorder, unspecified: Secondary | ICD-10-CM

## 2018-03-09 LAB — LIPID PANEL
CHOLESTEROL: 262 mg/dL — AB (ref 0–200)
HDL: 32 mg/dL — AB (ref >40)
LDL Cholesterol: UNDETERMINED mg/dL (ref 0–99)
TRIGLYCERIDES: 521 mg/dL — AB (ref <150)
Total CHOL/HDL Ratio: 8.2 RATIO
VLDL: UNDETERMINED mg/dL (ref 0–40)

## 2018-03-09 LAB — COMPREHENSIVE METABOLIC PANEL
ALT: 17 U/L (ref 14–54)
AST: 17 U/L (ref 15–41)
Albumin: 3.7 g/dL (ref 3.5–5.0)
Alkaline Phosphatase: 90 U/L (ref 38–126)
Anion gap: 11 (ref 5–15)
BILIRUBIN TOTAL: 0.5 mg/dL (ref 0.3–1.2)
BUN: 16 mg/dL (ref 6–20)
CO2: 25 mmol/L (ref 22–32)
CREATININE: 1.03 mg/dL — AB (ref 0.44–1.00)
Calcium: 9.5 mg/dL (ref 8.9–10.3)
Chloride: 105 mmol/L (ref 101–111)
GFR calc Af Amer: >60 mL/min (ref >60)
Glucose, Bld: 120 mg/dL — ABNORMAL HIGH (ref 65–99)
POTASSIUM: 3.9 mmol/L (ref 3.5–5.1)
Sodium: 141 mmol/L (ref 135–145)
TOTAL PROTEIN: 6.7 g/dL (ref 6.5–8.1)

## 2018-03-09 NOTE — Progress Notes (Signed)
BP (!) 149/104 (BP Location: Right Arm, Patient Position: Sitting, Cuff Size: Normal)   Pulse 95   Temp 97.7 F (36.5 C)   Ht 5\' 7"  (1.702 m)   Wt 280 lb 8 oz (127.2 kg)   LMP 12/19/2014   SpO2 99%   BMI 43.93 kg/m    Subjective:    Patient ID: Dominique Harrington, female    DOB: 12/14/1970, 47 y.o.   MRN: [id number]  HPI: Dominique HasteCarrie P Rosenow is a 47 y.o. female presenting on 03/09/2018 for Hypertension and Hyperlipidemia   HPI   Pt's high school age daughter is with her today  [facility] since her last OV here.    Pt bp was very low when she was at neurologist-  88/72 as compared with bp up today.   She says she had HA today which is why it's up some.    Pt has follow up with neurologist in 3 months.    Pt says [facility] is not [facility].  She says dr Geanie CooleyLay was telling her she was mental because she went through early menapause, he wouldn't allow her husband to go back with her and he spent ten minutes telling her to turn off her phone (which was already off and in her purse).  Pt says she was with him for 10 minutes and she left in tears.   Pt says she is doing much better with medication changes made by neurologist  Pt is still smoking about 1/2 ppd.  Relevant past medical, surgical, family and social history reviewed and updated as indicated. Interim medical history since our last visit reviewed. Allergies and medications reviewed and updated.   Current Outpatient Medications:  .  Acetaminophen-Caffeine (EXCEDRIN TENSION HEADACHE PO), Take by mouth., Disp: , Rfl:  .  amitriptyline (ELAVIL) 10 MG tablet, Take 1 tablet (10 mg total) by mouth at bedtime., Disp: 30 tablet, Rfl: 3 .  aspirin EC 81 MG tablet, Take 81 mg by mouth daily., Disp: , Rfl:  .  atorvastatin (LIPITOR) 20 MG tablet, Take 1 tablet (20 mg total) by mouth daily. (Patient taking differently: Take 20 mg by mouth at bedtime. ), Disp: 90 tablet, Rfl: 3 .  ibuprofen (ADVIL,MOTRIN)  200 MG tablet, Take 400-800 mg by mouth every 6 (six) hours as needed for headache., Disp: , Rfl:  .  lisinopril-hydrochlorothiazide (ZESTORETIC) 20-12.5 MG tablet, Take 1 tablet by mouth daily., Disp: 1 tablet, Rfl: 00 .  Omega-3 Fatty Acids (FISH OIL PO), Take 2,000 mg by mouth 2 (two) times daily. , Disp: , Rfl:  .  promethazine (PHENERGAN) 25 MG tablet, Take 1 tablet (25 mg total) by mouth every 6 (six) hours as needed for nausea or vomiting., Disp: 30 tablet, Rfl: 2    Review of Systems  Constitutional: Positive for appetite change and fatigue. Negative for chills, diaphoresis, fever and unexpected weight change.  HENT: Negative for congestion, dental problem, drooling, ear pain, facial swelling, hearing loss, mouth sores, sneezing, sore throat, trouble swallowing and voice change.   Eyes: Negative for pain, discharge, redness, itching and visual disturbance.  Respiratory: Positive for cough. Negative for choking, shortness of breath and wheezing.   Cardiovascular: Negative for chest pain, palpitations and leg swelling.  Gastrointestinal: Negative for abdominal pain, blood in stool, constipation, diarrhea and vomiting.  Endocrine: Negative for cold intolerance, heat intolerance and polydipsia.  Genitourinary: Negative for decreased urine volume, dysuria and hematuria.  Musculoskeletal: Positive  for gait problem. Negative for arthralgias and back pain.  Skin: Negative for rash.  Allergic/Immunologic: Negative for environmental allergies.  Neurological: Positive for light-headedness and headaches. Negative for seizures and syncope.  Hematological: Negative for adenopathy.  Psychiatric/Behavioral: Positive for agitation and dysphoric mood. Negative for suicidal ideas. The patient is nervous/anxious.     Per HPI unless specifically indicated above     Objective:    BP (!) 149/104 (BP Location: Right Arm, Patient Position: Sitting, Cuff Size: Normal)   Pulse 95   Temp 97.7 F (36.5 C)    Ht 5\' 7"  (1.702 m)   Wt 280 lb 8 oz (127.2 kg)   LMP 12/19/2014   SpO2 99%   BMI 43.93 kg/m   Wt Readings from Last 3 Encounters:  03/09/18 280 lb 8 oz (127.2 kg)  03/03/18 278 lb (126.1 kg)  02/10/18 283 lb (128.4 kg)    Physical Exam  Constitutional: She is oriented to person, place, and time. She appears well-developed and well-nourished.  HENT:  Head: Normocephalic and atraumatic.  Neck: Neck supple.  Cardiovascular: Normal rate and regular rhythm.  Pulmonary/Chest: Effort normal and breath sounds normal. No respiratory distress. She has no wheezes.  Abdominal: Soft. Bowel sounds are normal. There is no tenderness.  Neurological: She is alert and oriented to person, place, and time. She has normal strength. She displays no atrophy and no tremor.  Pt is still using cane for ambulation but she is strong and equal.  Skin: Skin is warm and dry.  Psychiatric: She has a normal mood and affect. Her behavior is normal. Thought content normal.  Pt upbeat and in good spirits today  Nursing note and vitals reviewed.       Assessment & Plan:   Encounter Diagnoses  Name Primary?  . Essential hypertension, benign Yes  . Hyperlipidemia, unspecified hyperlipidemia type   . Cigarette nicotine dependence with nicotine-induced disorder   . Migraine syndrome   . Anxiety   . Depression, unspecified depression type   . Morbid obesity (HCC)      -Check lipids- test ordered March not done yet.  -gave pt calendar of activities for cardinal in [place]  -pt to continue current medications -reminded pt she needs to stop smoking -pt says she will not return to [facility].  Currently there are no alternatives for MH care for the uninsured in [facility] -encouraged pt to exercise/walk regularly but safely -pt to follow up with neurology as scheduled -pt to follow up here in 3 months.  RTO sooner prn

## 2018-03-30 ENCOUNTER — Other Ambulatory Visit: Payer: Self-pay | Admitting: Neurology

## 2018-04-13 ENCOUNTER — Encounter

## 2018-04-13 ENCOUNTER — Ambulatory Visit: Payer: Self-pay | Admitting: Neurology

## 2018-04-28 ENCOUNTER — Other Ambulatory Visit: Payer: Self-pay | Admitting: Neurology

## 2018-05-27 ENCOUNTER — Other Ambulatory Visit: Payer: Self-pay | Admitting: Neurology

## 2018-06-07 ENCOUNTER — Other Ambulatory Visit (HOSPITAL_COMMUNITY)
Admission: RE | Admit: 2018-06-07 | Discharge: 2018-06-07 | Disposition: A | Discharge status: Home / Self Care | Payer: Self-pay | Source: Ambulatory Visit | Attending: Physician Assistant | Admitting: Physician Assistant

## 2018-06-07 DIAGNOSIS — I10 Essential (primary) hypertension: Secondary | ICD-10-CM | POA: Insufficient documentation

## 2018-06-07 DIAGNOSIS — E785 Hyperlipidemia, unspecified: Secondary | ICD-10-CM | POA: Insufficient documentation

## 2018-06-07 LAB — COMPREHENSIVE METABOLIC PANEL
ALT: 24 U/L (ref 0–44)
AST: 25 U/L (ref 15–41)
Albumin: 3.9 g/dL (ref 3.5–5.0)
Alkaline Phosphatase: 89 U/L (ref 38–126)
Anion gap: 10 (ref 5–15)
BILIRUBIN TOTAL: 0.5 mg/dL (ref 0.3–1.2)
BUN: 16 mg/dL (ref 6–20)
CHLORIDE: 108 mmol/L (ref 98–111)
CO2: 22 mmol/L (ref 22–32)
CREATININE: 1.03 mg/dL — AB (ref 0.44–1.00)
Calcium: 9.4 mg/dL (ref 8.9–10.3)
Glucose, Bld: 106 mg/dL — ABNORMAL HIGH (ref 70–99)
Potassium: 4.2 mmol/L (ref 3.5–5.1)
Sodium: 140 mmol/L (ref 135–145)
TOTAL PROTEIN: 6.7 g/dL (ref 6.5–8.1)

## 2018-06-07 LAB — LIPID PANEL
CHOLESTEROL: 231 mg/dL — AB (ref 0–200)
HDL: 33 mg/dL — AB (ref >40)
LDL CALC: 123 mg/dL — AB (ref 0–99)
TRIGLYCERIDES: 375 mg/dL — AB (ref <150)
Total CHOL/HDL Ratio: 7 RATIO
VLDL: 75 mg/dL — ABNORMAL HIGH (ref 0–40)

## 2018-06-09 ENCOUNTER — Encounter: Payer: Self-pay | Admitting: Physician Assistant

## 2018-06-09 ENCOUNTER — Ambulatory Visit: Payer: Self-pay | Admitting: Physician Assistant

## 2018-06-09 VITALS — BP 117/84 | HR 91 | Temp 97.3°F

## 2018-06-09 DIAGNOSIS — F329 Major depressive disorder, single episode, unspecified: Secondary | ICD-10-CM

## 2018-06-09 DIAGNOSIS — E785 Hyperlipidemia, unspecified: Secondary | ICD-10-CM

## 2018-06-09 DIAGNOSIS — I10 Essential (primary) hypertension: Secondary | ICD-10-CM

## 2018-06-09 DIAGNOSIS — F32A Depression, unspecified: Secondary | ICD-10-CM

## 2018-06-09 DIAGNOSIS — G43909 Migraine, unspecified, not intractable, without status migrainosus: Secondary | ICD-10-CM

## 2018-06-09 DIAGNOSIS — F17200 Nicotine dependence, unspecified, uncomplicated: Secondary | ICD-10-CM

## 2018-06-09 DIAGNOSIS — F419 Anxiety disorder, unspecified: Secondary | ICD-10-CM

## 2018-06-09 MED ORDER — FENOFIBRATE 145 MG PO TABS: 145.0000 mg | ORAL_TABLET | Freq: Every day | ORAL | 4 refills | Status: DC

## 2018-06-09 NOTE — Progress Notes (Signed)
BP 117/84   Pulse 91   Temp (!) 97.3 F (36.3 C)   LMP 12/19/2014   SpO2 100%    Subjective:    Patient ID: Dominique Harrington, female    DOB: 1970/11/16, 47 y.o.   MRN: [id number]  HPI: Dominique HasteCarrie P Blumenstein is a 47 y.o. female presenting on 06/09/2018 for Hypertension and Hyperlipidemia   HPI   Pt is doing well.  She has upcoming appointment with neurology soon.  She is getting around much better.  She still uses her cane because she says occassionally she still gets "dizzy and shakey".   Relevant past medical, surgical, family and social history reviewed and updated as indicated. Interim medical history since our last visit reviewed. Allergies and medications reviewed and updated.   Current Outpatient Medications:  .  Acetaminophen-Caffeine (EXCEDRIN TENSION HEADACHE PO), Take by mouth., Disp: , Rfl:  .  amitriptyline (ELAVIL) 10 MG tablet, Take 1 tablet (10 mg total) by mouth at bedtime., Disp: 30 tablet, Rfl: 3 .  aspirin EC 81 MG tablet, Take 81 mg by mouth daily., Disp: , Rfl:  .  atorvastatin (LIPITOR) 20 MG tablet, Take 1 tablet (20 mg total) by mouth daily. (Patient taking differently: Take 20 mg by mouth at bedtime. ), Disp: 90 tablet, Rfl: 3 .  lisinopril-hydrochlorothiazide (ZESTORETIC) 20-12.5 MG tablet, Take 1 tablet by mouth daily., Disp: 1 tablet, Rfl: 00 .  Omega-3 Fatty Acids (FISH OIL PO), Take 2,000 mg by mouth 2 (two) times daily. , Disp: , Rfl:  .  promethazine (PHENERGAN) 25 MG tablet, TAKE 1 TABLET BY MOUTH EVERY 6 HOURS AS NEEDED FOR NAUSEA AND VOMITING, Disp: 30 tablet, Rfl: 0   Review of Systems  Constitutional: Positive for fatigue. Negative for appetite change, chills, diaphoresis, fever and unexpected weight change.  HENT: Negative for congestion, dental problem, drooling, ear pain, facial swelling, hearing loss, mouth sores, sneezing, sore throat, trouble swallowing and voice change.   Eyes: Negative for pain, discharge, redness, itching and visual  disturbance.  Respiratory: Negative for cough, choking, shortness of breath and wheezing.   Cardiovascular: Negative for chest pain, palpitations and leg swelling.  Gastrointestinal: Negative for abdominal pain, blood in stool, constipation, diarrhea and vomiting.  Endocrine: Negative for cold intolerance, heat intolerance and polydipsia.  Genitourinary: Negative for decreased urine volume, dysuria and hematuria.  Musculoskeletal: Positive for gait problem. Negative for arthralgias and back pain.  Skin: Negative for rash.  Allergic/Immunologic: Negative for environmental allergies.  Neurological: Positive for light-headedness and headaches. Negative for seizures and syncope.  Hematological: Negative for adenopathy.  Psychiatric/Behavioral: Positive for dysphoric mood. Negative for agitation and suicidal ideas. The patient is nervous/anxious.     Per HPI unless specifically indicated above     Objective:    BP 117/84   Pulse 91   Temp (!) 97.3 F (36.3 C)   LMP 12/19/2014   SpO2 100%   Wt Readings from Last 3 Encounters:  03/09/18 280 lb 8 oz (127.2 kg)  03/03/18 278 lb (126.1 kg)  02/10/18 283 lb (128.4 kg)    Physical Exam  Constitutional: She is oriented to person, place, and time. She appears well-developed and well-nourished.  HENT:  Head: Normocephalic and atraumatic.  Neck: Neck supple.  Cardiovascular: Normal rate and regular rhythm.  Pulmonary/Chest: Effort normal and breath sounds normal.  Abdominal: Soft. Bowel sounds are normal. She exhibits no mass. There is no hepatosplenomegaly. There is no tenderness.  Musculoskeletal: She exhibits no edema.  Lymphadenopathy:  She has no cervical adenopathy.  Neurological: She is alert and oriented to person, place, and time.  Skin: Skin is warm and dry.  Psychiatric: She has a normal mood and affect. Her behavior is normal.  Vitals reviewed.   Results for orders placed or performed during the hospital encounter of  06/07/18  Lipid panel  Result Value Ref Range   Cholesterol 231 (H) 0 - 200 mg/dL   Triglycerides 604 (H) <150 mg/dL   HDL 33 (L) >54 mg/dL   Total CHOL/HDL Ratio 7.0 RATIO   VLDL 75 (H) 0 - 40 mg/dL   LDL Cholesterol 098 (H) 0 - 99 mg/dL  Comprehensive metabolic panel  Result Value Ref Range   Sodium 140 135 - 145 mmol/L   Potassium 4.2 3.5 - 5.1 mmol/L   Chloride 108 98 - 111 mmol/L   CO2 22 22 - 32 mmol/L   Glucose, Bld 106 (H) 70 - 99 mg/dL   BUN 16 6 - 20 mg/dL   Creatinine, Ser 1.19 (H) 0.44 - 1.00 mg/dL   Calcium 9.4 8.9 - 14.7 mg/dL   Total Protein 6.7 6.5 - 8.1 g/dL   Albumin 3.9 3.5 - 5.0 g/dL   AST 25 15 - 41 U/L   ALT 24 0 - 44 U/L   Alkaline Phosphatase 89 38 - 126 U/L   Total Bilirubin 0.5 0.3 - 1.2 mg/dL   GFR calc non Af Amer >60 >60 mL/min   GFR calc Af Amer >60 >60 mL/min   Anion gap 10 5 - 15      Assessment & Plan:   Encounter Diagnoses  Name Primary?  . Essential hypertension, benign Yes  . Hyperlipidemia, unspecified hyperlipidemia type   . Tobacco use disorder   . Migraine syndrome   . Anxiety   . Depression, unspecified depression type     -reviewed labs with pt -will Start fenofibrate. Stop fish oil. Pt to continue lowfat diet and encouraged regular exercise -pt to Continue other meds -counseled smoking cessation -pt to follow up with neurology as scheduled.   -pt to follow up here 3 months.  RTO sooner prn

## 2018-06-23 NOTE — Progress Notes (Deleted)
NEUROLOGY FOLLOW UP OFFICE NOTE  Dominique Harrington [id number]  HISTORY OF PRESENT ILLNESS: Dominique Harrington is a 47 year old right-handed female with hypertension, hyperlipidemia, depression and migraines who follows up for migraines and history of stroke-like event.  She is accompanied by her husband who supplements history.    UPDATE: Intensity:  *** Duration:  *** Frequency:  *** Frequency of abortive medication: *** Current NSAIDS:  ASA 81mg  (secondary stroke prevention) Current analgesics:  *** Current triptans:  no Current ergotamine:  no Current anti-emetic:  Promethazine 25mg  Current muscle relaxants:  no Current anti-anxiolytic:  no Current sleep aide:  no Current Antihypertensive medications:  Lisinopril-HCTZ Current Antidepressant medications:  Amitriptyline 10mg  Current Anticonvulsant medications:  no Current anti-CGRP:  no Current Vitamins/Herbal/Supplements:  no Current Antihistamines/Decongestants:  no Other therapy:  no  Caffeine:  caffeinated soda daily Alcohol:  no Smoker:  Yes.  *** Diet:  *** Exercise:  Not routine Depression:  yes; Anxiety:  yes Other pain:  no Sleep hygiene:  poor  HISTORY: She was admitted to [facility] from 09/12/17 to 09/14/17 after waking up with dysarthria and left sided numbness of face, arm and leg and weakness of arm and leg.  Symptoms mostly resolved in hospital but had some residual weakness.  She underwent a stroke workup.  MRI of brain was negative for acute abnormalities.  MRA of head demonstrated no large vessel occlusion or stenosis.  Carotid doppler revealed no hemodynamically significant ICA stenosis.  Echocardiogram showed EF 55-60% with no cardiac source of emboli.  In-house neurology had no explanation for her symptoms.  She was started on ASA 162mg  daily.  She was discharged and had an outpatient EEG on 09/16/17 which was normal.  She had been going to outpatient rehab until mid January.  She began  experiencing dizziness with lightheadedness.  She reported that her blood pressure had been fluctuating.  She presented to the ED at [facility] where she had a witnessed syncopal episode.  When she regained consciousness, she reported that she felt she was getting a migraine.  She denied palpitations.  EKG was normal.  CT of head was negative.  Her PCP discontinued her metoprolol.  She has been ambulating with a walker and sometimes a cane.  She still feels dizzy at times.  Blood pressure has reportedly fluctuated as high as 180s/130s.  She reports several falls.  She reports significant anxiety and depression.  She is a smoker and currently trying to quit with the patch.  She presented to the ED on 02/08/18 for intractable migraine and confusion.  She exhibited no lateralizing symptoms and was alert and oriented x 3.  She was discharged but returned the next day for continued headache and confusion.  CT head was unremarkable.  She was treated with headache cocktail and discontinued topiramate.    She has history of migraines since her early 53s.  They became worse after the birth of her daughter in her late 35s and second daughter in her 53s.  They are bifrontal radiating to the back of head and base of neck.  It is a severe stabbing pain that lasts 4 to 5 days and occurs every week.  There is associated nausea, photophobia.  There is no associated unilateral numbness, weakness, or visual disturbance.  Past NSAIDS:  Aleve, ibuprofen 400-800mg  (overuse) Past analgesics:  Fioricet, tramadol Past abortive triptans:  Maxalt 10mg  Past muscle relaxants:  Flexeril Past anti-emetic:  Reglan 10mg , Phenergan 25mg  Past antihypertensive medications:  metoprolol 100mg  twice daly Past antidepressant medications:  Amitriptyline (ineffective), citalopram 20mg  Past anticonvulsant medications:  topiramate 50mg  (caused possible confusion) Past vitamins/Herbal/Supplements:  no Past antihistamines/decongestants:   no Other past therapies:  no  Family history of headache:  No.  No known family history of stroke at young age. No history of seizures or head injury.  PAST MEDICAL HISTORY: Past Medical History:  Diagnosis Date  . Anxiety   . Dyspnea on exertion 2013   Echo, EF =>55%  . Hypertension   . Migraines   . Tachycardia   . TIA (transient ischemic attack)     MEDICATIONS: Current Outpatient Medications on File Prior to Visit  Medication Sig Dispense Refill  . Acetaminophen-Caffeine (EXCEDRIN TENSION HEADACHE PO) Take by mouth.    Marland Kitchen amitriptyline (ELAVIL) 10 MG tablet Take 1 tablet (10 mg total) by mouth at bedtime. 30 tablet 3  . aspirin EC 81 MG tablet Take 81 mg by mouth daily.    Marland Kitchen atorvastatin (LIPITOR) 20 MG tablet Take 1 tablet (20 mg total) by mouth daily. (Patient taking differently: Take 20 mg by mouth at bedtime. ) 90 tablet 3  . fenofibrate (TRICOR) 145 MG tablet Take 1 tablet (145 mg total) by mouth daily. 30 tablet 4  . lisinopril-hydrochlorothiazide (ZESTORETIC) 20-12.5 MG tablet Take 1 tablet by mouth daily. 1 tablet 00  . promethazine (PHENERGAN) 25 MG tablet TAKE 1 TABLET BY MOUTH EVERY 6 HOURS AS NEEDED FOR NAUSEA AND VOMITING 30 tablet 0   No current facility-administered medications on file prior to visit.     ALLERGIES: Allergies  Allergen Reactions  . Sulfa Antibiotics Nausea And Vomiting    FAMILY HISTORY: Family History  Problem Relation Age of Onset  . Heart disease Father   . Breast cancer Maternal Grandmother 12   SOCIAL HISTORY: Social History   Socioeconomic History  . Marital status: Married    Spouse name: Not on file  . Number of children: Not on file  . Years of education: Not on file  . Highest education level: Not on file  Occupational History  . Not on file  Social Needs  . Financial resource strain: Not on file  . Food insecurity:    Worry: Not on file    Inability: Not on file  . Transportation needs:    Medical: Not on file     Non-medical: Not on file  Tobacco Use  . Smoking status: Current Every Day Smoker    Packs/day: 0.50    Years: 26.00    Pack years: 13.00    Types: Cigarettes  . Smokeless tobacco: Never Used  Substance and Sexual Activity  . Alcohol use: No  . Drug use: No  . Sexual activity: Yes  Lifestyle  . Physical activity:    Days per week: Not on file    Minutes per session: Not on file  . Stress: Not on file  Relationships  . Social connections:    Talks on phone: Not on file    Gets together: Not on file    Attends religious service: Not on file    Active member of club or organization: Not on file    Attends meetings of clubs or organizations: Not on file    Relationship status: Not on file  . Intimate partner violence:    Fear of current or ex partner: Not on file    Emotionally abused: Not on file    Physically abused: Not on file  Forced sexual activity: Not on file  Other Topics Concern  . Not on file  Social History Narrative  . Not on file    REVIEW OF SYSTEMS: Constitutional: No fevers, chills, or sweats, no generalized fatigue, change in appetite Eyes: No visual changes, double vision, eye pain Ear, nose and throat: No hearing loss, ear pain, nasal congestion, sore throat Cardiovascular: No chest pain, palpitations Respiratory:  No shortness of breath at rest or with exertion, wheezes GastrointestinaI: No nausea, vomiting, diarrhea, abdominal pain, fecal incontinence Genitourinary:  No dysuria, urinary retention or frequency Musculoskeletal:  No neck pain, back pain Integumentary: No rash, pruritus, skin lesions Neurological: as above Psychiatric: No depression, insomnia, anxiety Endocrine: No palpitations, fatigue, diaphoresis, mood swings, change in appetite, change in weight, increased thirst Hematologic/Lymphatic:  No purpura, petechiae. Allergic/Immunologic: no itchy/runny eyes, nasal congestion, recent allergic reactions, rashes  PHYSICAL  EXAM: *** General: No acute distress.  Patient appears ***-groomed.  *** body habitus. Head:  Normocephalic/atraumatic Eyes:  Fundi examined but not visualized Neck: supple, no paraspinal tenderness, full range of motion Heart:  Regular rate and rhythm Lungs:  Clear to auscultation bilaterally Back: No paraspinal tenderness Neurological Exam: alert and oriented to person, place, and time. Attention span and concentration intact, recent and remote memory intact, fund of knowledge intact.  Speech fluent and not dysarthric, language intact.  CN II-XII intact. Bulk and tone normal, muscle strength 5/5 throughout.  Sensation to light touch, temperature and vibration intact.  Deep tendon reflexes 2+ throughout, toes downgoing.  Finger to nose and heel to shin testing intact.  Gait normal, Romberg negative.  IMPRESSION: 1.  Chronic migraine without aura, without status migrainosus, not intractable 2.  Stroke-like event.  An MRI-negative punctate thalamo-capsular stroke is possible. However, I am suspicious for conversion disorder. I do not think she had a seizure as such a prolonged Todd's paralysis is unlikely. For the same reason, I don't think she had a complicated migraine/hemiplegic migraine. Regardless, given her stroke risk factors, I would er on the side of caution and treat for secondary stroke prevention. 3.  Depression and anxiety  PLAN: 1.  For preventative management, *** 2.  For abortive therapy, *** 3.  Limit use of pain relievers to no more than 2 days out of week to prevent risk of rebound or medication-overuse headache. 4.  Keep headache diary 5.  Exercise, hydration, caffeine cessation, sleep hygiene, monitor for and avoid triggers 6.  Consider:  magnesium citrate 400mg  daily, riboflavin 400mg  daily, and coenzyme Q10 100mg  three times daily 7.  Continue ASA, statin therapy, blood pressure control for secondary prevention following questionable stroke (as managed by  PCP) 8. Follow up ***  Shon Millet, DO  CC: Jacquelin Hawking, PA-C

## 2018-06-27 ENCOUNTER — Ambulatory Visit: Payer: Self-pay | Admitting: Neurology

## 2018-08-10 ENCOUNTER — Emergency Department (HOSPITAL_COMMUNITY)
Admission: EM | Admit: 2018-08-10 | Discharge: 2018-08-10 | Disposition: A | Discharge status: Home / Self Care | Payer: Self-pay | Attending: Emergency Medicine | Admitting: Emergency Medicine

## 2018-08-10 ENCOUNTER — Encounter (HOSPITAL_COMMUNITY): Payer: Self-pay | Admitting: Emergency Medicine

## 2018-08-10 ENCOUNTER — Other Ambulatory Visit: Payer: Self-pay

## 2018-08-10 DIAGNOSIS — R51 Headache: Secondary | ICD-10-CM | POA: Insufficient documentation

## 2018-08-10 DIAGNOSIS — G8929 Other chronic pain: Secondary | ICD-10-CM

## 2018-08-10 DIAGNOSIS — Z79899 Other long term (current) drug therapy: Secondary | ICD-10-CM | POA: Insufficient documentation

## 2018-08-10 DIAGNOSIS — I10 Essential (primary) hypertension: Secondary | ICD-10-CM | POA: Insufficient documentation

## 2018-08-10 DIAGNOSIS — F17210 Nicotine dependence, cigarettes, uncomplicated: Secondary | ICD-10-CM | POA: Insufficient documentation

## 2018-08-10 DIAGNOSIS — Z7982 Long term (current) use of aspirin: Secondary | ICD-10-CM | POA: Insufficient documentation

## 2018-08-10 HISTORY — DX: Cerebral infarction, unspecified: I63.9

## 2018-08-10 MED ORDER — METOCLOPRAMIDE HCL 5 MG/ML IJ SOLN
10.0000 mg | Freq: Once | INTRAMUSCULAR | Status: AC
  Administered 2018-08-10: 10 mg via INTRAMUSCULAR
  Filled 2018-08-10: qty 2

## 2018-08-10 MED ORDER — METOCLOPRAMIDE HCL 10 MG PO TABS: 10.0000 mg | ORAL_TABLET | Freq: Four times a day (QID) | ORAL | 0 refills | Status: DC | PRN

## 2018-08-10 MED ORDER — KETOROLAC TROMETHAMINE 60 MG/2ML IM SOLN
60.0000 mg | Freq: Once | INTRAMUSCULAR | Status: AC
  Administered 2018-08-10: 60 mg via INTRAMUSCULAR
  Filled 2018-08-10: qty 2

## 2018-08-10 MED ORDER — DIPHENHYDRAMINE HCL 25 MG PO CAPS
50.0000 mg | ORAL_CAPSULE | Freq: Once | ORAL | Status: AC
  Administered 2018-08-10: 50 mg via ORAL
  Filled 2018-08-10: qty 2

## 2018-08-10 NOTE — ED Provider Notes (Signed)
[facility] Provider Note   CSN: [id number] Arrival date & time: 08/10/18  1037     History   Chief Complaint Chief Complaint  Patient presents with  . Migraine    HPI Antony HasteCarrie P Strough is a 47 y.o. female.   Migraine    Pt was seen at 1055. Per pt, c/o gradual onset and persistence of constant acute flair of her chronic migraine headache for the past 3 days. Has been associated with N/V.  Describes the headache as per her usual chronic migraine headache pain pattern for many years.  Denies headache was sudden or maximal in onset or at any time.  Denies visual changes, no focal motor weakness, no tingling/numbness in extremities, no fevers, no neck pain, no rash.      Past Medical History:  Diagnosis Date  . Anxiety   . Dyspnea on exertion 2013   Echo, EF =>55%  . Hypertension   . Migraines   . Stroke (HCC)   . Tachycardia   . TIA (transient ischemic attack)     Patient Active Problem List   Diagnosis Date Noted  . Sensory disturbance 09/14/2017  . Left hemiparesis (HCC) 09/14/2017  . Dysarthria   . TIA (transient ischemic attack) 09/12/2017  . Obesity, unspecified 11/11/2015  . Essential hypertension, benign 08/12/2015  . Hyperlipidemia 08/12/2015  . Cigarette nicotine dependence, uncomplicated 08/12/2015  . Headache, migraine 08/12/2015    Past Surgical History:  Procedure Laterality Date  . BACK SURGERY    . COSMETIC SURGERY    . HERNIA REPAIR    . TUBAL LIGATION  2004     OB History    Gravida  2   Para  2   Term  2   Preterm      AB      Living  2     SAB      TAB      Ectopic      Multiple      Live Births               Home Medications    Prior to Admission medications   Medication Sig Start Date End Date Taking? Authorizing Provider  Acetaminophen-Caffeine (EXCEDRIN TENSION HEADACHE PO) Take by mouth.    [provider]  amitriptyline (ELAVIL) 10 MG tablet Take 1 tablet (10 mg total) by  mouth at bedtime. 03/03/18   Drema DallasJaffe, Adam R, DO  aspirin EC 81 MG tablet Take 81 mg by mouth daily.    [provider]  atorvastatin (LIPITOR) 20 MG tablet Take 1 tablet (20 mg total) by mouth daily. Patient taking differently: Take 20 mg by mouth at bedtime.  01/05/17   Jacquelin HawkingMcElroy, Shannon, PA-C  fenofibrate (TRICOR) 145 MG tablet Take 1 tablet (145 mg total) by mouth daily. 06/09/18   Jacquelin HawkingMcElroy, Shannon, PA-C  lisinopril-hydrochlorothiazide (ZESTORETIC) 20-12.5 MG tablet Take 1 tablet by mouth daily. 09/23/17   Jacquelin HawkingMcElroy, Shannon, PA-C  promethazine (PHENERGAN) 25 MG tablet TAKE 1 TABLET BY MOUTH EVERY 6 HOURS AS NEEDED FOR NAUSEA AND VOMITING 05/27/18   Drema DallasJaffe, Adam R, DO    Family History Family History  Problem Relation Age of Onset  . Heart disease Father   . Breast cancer Maternal Grandmother [id number]    Social History Social History   Tobacco Use  . Smoking status: Current Every Day Smoker    Packs/day: 1.00    Years: 26.00    Pack years: 26.00    Types:  Cigarettes  . Smokeless tobacco: Never Used  Substance Use Topics  . Alcohol use: No  . Drug use: No     Allergies   Sulfa antibiotics   Review of Systems Review of Systems ROS: Statement: All systems negative except as marked or noted in the HPI; Constitutional: Negative for fever and chills. ; ; Eyes: Negative for eye pain, redness and discharge. ; ; ENMT: Negative for ear pain, hoarseness, nasal congestion, sinus pressure and sore throat. ; ; Cardiovascular: Negative for chest pain, palpitations, diaphoresis, dyspnea and peripheral edema. ; ; Respiratory: Negative for cough, wheezing and stridor. ; ; Gastrointestinal: +N/V. Negative for diarrhea, abdominal pain, blood in stool, hematemesis, jaundice and rectal bleeding. . ; ; Genitourinary: Negative for dysuria, flank pain and hematuria. ; ; Musculoskeletal: Negative for back pain and neck pain. Negative for swelling and trauma.; ; Skin: Negative for pruritus, rash, abrasions,  blisters, bruising and skin lesion.; ; Neuro: +chronic headache. Negative for lightheadedness and neck stiffness. Negative for weakness, altered level of consciousness, altered mental status, extremity weakness, paresthesias, involuntary movement, seizure and syncope.       Physical Exam Updated Vital Signs BP 113/90 Comment: repeated  Pulse (!) 109   Temp 98 F (36.7 C) (Oral)   Resp 20   Ht 5\' 7"  (1.702 m)   Wt 117.9 kg   LMP 12/19/2014   SpO2 100%   BMI 40.72 kg/m   Physical Exam 1100: Physical examination:  Nursing notes reviewed; Vital signs and O2 SAT reviewed;  Constitutional: Well developed, Well nourished, Well hydrated, In no acute distress; Head:  Normocephalic, atraumatic; Eyes: EOMI, PERRL, No scleral icterus; ENMT: Mouth and pharynx normal, Mucous membranes moist; Neck: Supple, Full range of motion, No lymphadenopathy; Cardiovascular: Regular rate and rhythm, No gallop; Respiratory: Breath sounds clear & equal bilaterally, No wheezes.  Speaking full sentences with ease, Normal respiratory effort/excursion; Chest: Nontender, Movement normal; Abdomen: Soft, Nontender, Nondistended, Normal bowel sounds; Genitourinary: No CVA tenderness; Extremities: Peripheral pulses normal, No tenderness, No edema, No calf edema or asymmetry.; Neuro: AA&Ox3, Major CN grossly intact.  Speech clear. Moves all extremities spontaneously and to command without apparent gross gross focal motor deficits.; Skin: Color normal, Warm, Dry.   ED Treatments / Results  Labs (all labs ordered are listed, but only abnormal results are displayed)   EKG None  Radiology   Procedures Procedures (including critical care time)  Medications Ordered in ED Medications  ketorolac (TORADOL) injection 60 mg (has no administration in time range)  metoCLOPramide (REGLAN) injection 10 mg (has no administration in time range)  diphenhydrAMINE (BENADRYL) capsule 50 mg (has no administration in time range)      Initial Impression / Assessment and Plan / ED Course  I have reviewed the triage vital signs and the nursing notes.  Pertinent labs & imaging results that were available during my care of the patient were reviewed by me and considered in my medical decision making (see chart for details)..   MDM Reviewed: previous chart, nursing note and vitals   1345:  Feels better after meds and is ready to go home now. Tx symptomatically at this time.  Dx d/w pt.  Questions answered.  Verb understanding, agreeable to d/c home with outpt f/u.     Final Clinical Impressions(s) / ED Diagnoses   Final diagnoses:  None    ED Discharge Orders    None       Samuel Jester, DO 08/15/18 1612

## 2018-08-10 NOTE — ED Triage Notes (Signed)
Pt reports history of migraines has had this one for 3 days. Takes meds at home but not helping .reports has n/v

## 2018-08-10 NOTE — Discharge Instructions (Signed)
Take over the counter tylenol, ibuprofen and benadryl, as directed on packaging, with the prescription given to you today, as needed for headache. Call your regular medical doctor today to schedule a follow up appointment within the next 3 days.  Call your Neurologist today to schedule a follow up appointment within the next week. Return to the Emergency Department immediately sooner if worsening.

## 2018-09-05 ENCOUNTER — Ambulatory Visit: Payer: Self-pay | Admitting: Physician Assistant

## 2018-09-15 ENCOUNTER — Encounter: Payer: Self-pay | Admitting: Physician Assistant

## 2018-09-22 ENCOUNTER — Encounter (HOSPITAL_COMMUNITY): Payer: Self-pay | Admitting: Emergency Medicine

## 2018-09-22 ENCOUNTER — Other Ambulatory Visit: Payer: Self-pay

## 2018-09-22 ENCOUNTER — Emergency Department (HOSPITAL_COMMUNITY)
Admission: EM | Admit: 2018-09-22 | Discharge: 2018-09-22 | Disposition: A | Discharge status: Home / Self Care | Payer: Self-pay | Attending: Emergency Medicine | Admitting: Emergency Medicine

## 2018-09-22 ENCOUNTER — Emergency Department (HOSPITAL_COMMUNITY): Payer: Self-pay

## 2018-09-22 DIAGNOSIS — F17210 Nicotine dependence, cigarettes, uncomplicated: Secondary | ICD-10-CM | POA: Insufficient documentation

## 2018-09-22 DIAGNOSIS — Z79899 Other long term (current) drug therapy: Secondary | ICD-10-CM | POA: Insufficient documentation

## 2018-09-22 DIAGNOSIS — I10 Essential (primary) hypertension: Secondary | ICD-10-CM | POA: Insufficient documentation

## 2018-09-22 DIAGNOSIS — Z8673 Personal history of transient ischemic attack (TIA), and cerebral infarction without residual deficits: Secondary | ICD-10-CM | POA: Insufficient documentation

## 2018-09-22 DIAGNOSIS — Z7982 Long term (current) use of aspirin: Secondary | ICD-10-CM | POA: Insufficient documentation

## 2018-09-22 DIAGNOSIS — R600 Localized edema: Secondary | ICD-10-CM | POA: Insufficient documentation

## 2018-09-22 LAB — CBC WITH DIFFERENTIAL/PLATELET
Abs Immature Granulocytes: 0.04 10*3/uL (ref 0.00–0.07)
BASOS PCT: 1 %
Basophils Absolute: 0.1 10*3/uL (ref 0.0–0.1)
EOS ABS: 0.3 10*3/uL (ref 0.0–0.5)
EOS PCT: 3 %
HEMATOCRIT: 41.9 % (ref 36.0–46.0)
Hemoglobin: 13.3 g/dL (ref 12.0–15.0)
Immature Granulocytes: 0 %
LYMPHS ABS: 2.3 10*3/uL (ref 0.7–4.0)
Lymphocytes Relative: 22 %
MCH: 30.4 pg (ref 26.0–34.0)
MCHC: 31.7 g/dL (ref 30.0–36.0)
MCV: 95.7 fL (ref 80.0–100.0)
MONO ABS: 0.7 10*3/uL (ref 0.1–1.0)
MONOS PCT: 7 %
Neutro Abs: 7.1 10*3/uL (ref 1.7–7.7)
Neutrophils Relative %: 67 %
PLATELETS: 239 10*3/uL (ref 150–400)
RBC: 4.38 MIL/uL (ref 3.87–5.11)
RDW: 13.9 % (ref 11.5–15.5)
WBC: 10.5 10*3/uL (ref 4.0–10.5)
nRBC: 0 % (ref 0.0–0.2)

## 2018-09-22 LAB — BASIC METABOLIC PANEL
ANION GAP: 9 (ref 5–15)
BUN: 17 mg/dL (ref 6–20)
CALCIUM: 9.6 mg/dL (ref 8.9–10.3)
CO2: 25 mmol/L (ref 22–32)
Chloride: 104 mmol/L (ref 98–111)
Creatinine, Ser: 0.79 mg/dL (ref 0.44–1.00)
GFR calc Af Amer: >60 mL/min (ref >60)
GLUCOSE: 139 mg/dL — AB (ref 70–99)
Potassium: 3.6 mmol/L (ref 3.5–5.1)
Sodium: 138 mmol/L (ref 135–145)

## 2018-09-22 LAB — LACTIC ACID, PLASMA
Lactic Acid, Venous: 1.8 mmol/L (ref 0.5–1.9)
Lactic Acid, Venous: 1.4 mmol/L (ref 0.5–1.9)

## 2018-09-22 LAB — D-DIMER, QUANTITATIVE (NOT AT ARMC): D DIMER QUANT: 0.41 ug{FEU}/mL (ref 0.00–0.50)

## 2018-09-22 MED ORDER — ONDANSETRON HCL 4 MG/2ML IJ SOLN
INTRAMUSCULAR | Status: AC
  Administered 2018-09-22: 4 mg via INTRAVENOUS
  Filled 2018-09-22: qty 2

## 2018-09-22 MED ORDER — SODIUM CHLORIDE 0.9 % IV SOLN
INTRAVENOUS | Status: DC | PRN
  Administered 2018-09-22: 250 mL via INTRAVENOUS

## 2018-09-22 MED ORDER — DOXYCYCLINE HYCLATE 100 MG PO CAPS: 100.0000 mg | ORAL_CAPSULE | Freq: Two times a day (BID) | ORAL | 0 refills | Status: DC

## 2018-09-22 MED ORDER — VANCOMYCIN HCL IN DEXTROSE 1-5 GM/200ML-% IV SOLN
1000.0000 mg | Freq: Once | INTRAVENOUS | Status: AC
  Administered 2018-09-22: 1000 mg via INTRAVENOUS
  Filled 2018-09-22: qty 200

## 2018-09-22 MED ORDER — ONDANSETRON HCL 4 MG/2ML IJ SOLN
4.0000 mg | Freq: Once | INTRAMUSCULAR | Status: AC
  Administered 2018-09-22: 4 mg via INTRAVENOUS

## 2018-09-22 NOTE — ED Provider Notes (Addendum)
[facility] EMERGENCY DEPARTMENT Provider Note   CSN: [id number] Arrival date & time: 09/22/18  1219     History   Chief Complaint Chief Complaint  Patient presents with  . Leg Swelling    HPI Dominique Harrington is a 47 y.o. female.  Patient complains of swelling to her right leg for couple days.  She also has some pain in her posterior ankle.  The history is provided by the patient. No language interpreter was used.  Leg Pain   This is a new problem. The current episode started 2 days ago. The problem occurs constantly. The problem has not changed since onset.The pain is present in the right lower leg. The quality of the pain is described as aching. The pain is at a severity of 4/10. The pain is moderate. Pertinent negatives include full range of motion. She has tried nothing for the symptoms. There has been no history of extremity trauma.    Past Medical History:  Diagnosis Date  . Anxiety   . Dyspnea on exertion 2013   Echo, EF =>55%  . Hypertension   . Migraines   . Stroke (HCC)   . Tachycardia   . TIA (transient ischemic attack)     Patient Active Problem List   Diagnosis Date Noted  . Sensory disturbance 09/14/2017  . Left hemiparesis (HCC) 09/14/2017  . Dysarthria   . TIA (transient ischemic attack) 09/12/2017  . Obesity, unspecified 11/11/2015  . Essential hypertension, benign 08/12/2015  . Hyperlipidemia 08/12/2015  . Cigarette nicotine dependence, uncomplicated 08/12/2015  . Headache, migraine 08/12/2015    Past Surgical History:  Procedure Laterality Date  . BACK SURGERY    . COSMETIC SURGERY    . HERNIA REPAIR    . TUBAL LIGATION  2004     OB History    Gravida  2   Para  2   Term  2   Preterm      AB      Living  2     SAB      TAB      Ectopic      Multiple      Live Births               Home Medications    Prior to Admission medications   Medication Sig Start Date End Date Taking? Authorizing Provider    Acetaminophen-Caffeine (EXCEDRIN TENSION HEADACHE PO) Take by mouth.    [provider]  amitriptyline (ELAVIL) 10 MG tablet Take 1 tablet (10 mg total) by mouth at bedtime. 03/03/18   Drema Dallas, DO  aspirin EC 81 MG tablet Take 81 mg by mouth daily.    [provider]  atorvastatin (LIPITOR) 20 MG tablet Take 1 tablet (20 mg total) by mouth daily. Patient taking differently: Take 20 mg by mouth at bedtime.  01/05/17   Jacquelin Hawking, PA-C  doxycycline (VIBRAMYCIN) 100 MG capsule Take 1 capsule (100 mg total) by mouth 2 (two) times daily. One po bid x 7 days 09/22/18   Bethann Berkshire, MD  fenofibrate (TRICOR) 145 MG tablet Take 1 tablet (145 mg total) by mouth daily. 06/09/18   Jacquelin Hawking, PA-C  lisinopril-hydrochlorothiazide (ZESTORETIC) 20-12.5 MG tablet Take 1 tablet by mouth daily. 09/23/17   Jacquelin Hawking, PA-C  metoCLOPramide (REGLAN) 10 MG tablet Take 1 tablet (10 mg total) by mouth every 6 (six) hours as needed for nausea (or headache). 08/10/18   Samuel Jester, DO  promethazine [facility])  25 MG tablet TAKE 1 TABLET BY MOUTH EVERY 6 HOURS AS NEEDED FOR NAUSEA AND VOMITING 05/27/18   Drema DallasJaffe, Adam R, DO    Family History Family History  Problem Relation Age of Onset  . Heart disease Father   . Breast cancer Maternal Grandmother [id number]    Social History Social History   Tobacco Use  . Smoking status: Current Every Day Smoker    Packs/day: 1.00    Years: 26.00    Pack years: 26.00    Types: Cigarettes  . Smokeless tobacco: Never Used  Substance Use Topics  . Alcohol use: No  . Drug use: No     Allergies   Sulfa antibiotics   Review of Systems Review of Systems  Constitutional: Negative for appetite change and fatigue.  HENT: Negative for congestion, ear discharge and sinus pressure.   Eyes: Negative for discharge.  Respiratory: Negative for cough.   Cardiovascular: Negative for chest pain.  Gastrointestinal: Negative for abdominal pain  and diarrhea.  Genitourinary: Negative for frequency and hematuria.  Musculoskeletal: Negative for back pain.       Right leg pain  Skin: Negative for rash.  Neurological: Negative for seizures and headaches.  Psychiatric/Behavioral: Negative for hallucinations.     Physical Exam Updated Vital Signs BP 110/81 (BP Location: Right Arm)   Pulse 100   Temp 99.1 F (37.3 C) (Oral)   Resp 18   Ht 5\' 7"  (1.702 m)   Wt 117.9 kg   LMP 12/19/2014   SpO2 97%   BMI 40.72 kg/m   Physical Exam Constitutional:      Appearance: She is well-developed.  HENT:     Head: Normocephalic.     Nose: Nose normal.  Eyes:     General: No scleral icterus.    Conjunctiva/sclera: Conjunctivae normal.  Neck:     Musculoskeletal: Neck supple.     Thyroid: No thyromegaly.  Cardiovascular:     Rate and Rhythm: Normal rate and regular rhythm.     Heart sounds: No murmur. No friction rub. No gallop.   Pulmonary:     Breath sounds: No stridor. No wheezing or rales.  Chest:     Chest wall: No tenderness.  Abdominal:     General: There is no distension.     Tenderness: There is no abdominal tenderness. There is no rebound.  Musculoskeletal: Normal range of motion.     Comments: Swelling to right lower leg with tenderness to calf.  Mild redness  Lymphadenopathy:     Cervical: No cervical adenopathy.  Skin:    Findings: No erythema or rash.  Neurological:     Mental Status: She is alert and oriented to person, place, and time.     Motor: No abnormal muscle tone.     Coordination: Coordination normal.  Psychiatric:        Behavior: Behavior normal.      ED Treatments / Results  Labs (all labs ordered are listed, but only abnormal results are displayed) Labs Reviewed  BASIC METABOLIC PANEL - Abnormal; Notable for the following components:      Result Value   Glucose, Bld 139 (*)    All other components within normal limits  D-DIMER, QUANTITATIVE (NOT AT [facility])  CBC WITH  DIFFERENTIAL/PLATELET  LACTIC ACID, PLASMA  LACTIC ACID, PLASMA    EKG None  Radiology [place] Venous Img Lower Unilateral Right  Result Date: 09/22/2018 CLINICAL DATA:  47 year old female with right lower extremity redness, pain and edema.  EXAM: RIGHT LOWER EXTREMITY VENOUS DOPPLER ULTRASOUND TECHNIQUE: Gray-scale sonography with graded compression, as well as color Doppler and duplex ultrasound were performed to evaluate the lower extremity deep venous systems from the level of the common femoral vein and including the common femoral, femoral, profunda femoral, popliteal and calf veins including the posterior tibial, peroneal and gastrocnemius veins when visible. The superficial great saphenous vein was also interrogated. Spectral Doppler was utilized to evaluate flow at rest and with distal augmentation maneuvers in the common femoral, femoral and popliteal veins. COMPARISON:  None. FINDINGS: Contralateral Common Femoral Vein: Respiratory phasicity is normal and symmetric with the symptomatic side. No evidence of thrombus. Normal compressibility. Common Femoral Vein: No evidence of thrombus. Normal compressibility, respiratory phasicity and response to augmentation. Saphenofemoral Junction: No evidence of thrombus. Normal compressibility and flow on color Doppler imaging. Profunda Femoral Vein: No evidence of thrombus. Normal compressibility and flow on color Doppler imaging. Femoral Vein: No evidence of thrombus. Normal compressibility, respiratory phasicity and response to augmentation. Popliteal Vein: No evidence of thrombus. Normal compressibility, respiratory phasicity and response to augmentation. Calf Veins: No evidence of thrombus. Normal compressibility and flow on color Doppler imaging. Superficial Great Saphenous Vein: No evidence of thrombus. Normal compressibility. Venous Reflux:  None. Other Findings:  None. IMPRESSION: No evidence of deep venous thrombosis. Electronically Signed   By: Malachy MoanHeath   McCullough M.D.   On: 09/22/2018 14:07    Procedures Procedures (including critical care time)  Medications Ordered in ED Medications  vancomycin (VANCOCIN) IVPB 1000 mg/200 mL premix (1,000 mg Intravenous New Bag/Given 09/22/18 1617)  0.9 %  sodium chloride infusion (250 mLs Intravenous New Bag/Given 09/22/18 1617)  ondansetron (ZOFRAN) injection 4 mg (4 mg Intravenous Given 09/22/18 1633)  ondansetron (ZOFRAN) 4 MG/2ML injection (4 mg Intravenous Given 09/22/18 1633)     Initial Impression / Assessment and Plan / ED Course  I have reviewed the triage vital signs and the nursing notes.  Pertinent labs & imaging results that were available during my care of the patient were reviewed by me and considered in my medical decision making (see chart for details).     Ultrasound does not show DVT labs unremarkable.  Patient will be treated for possible cellulitis.   Patient will follow-up with her PCP in a few days  Final Clinical Impressions(s) / ED Diagnoses   Final diagnoses:  Leg edema    ED Discharge Orders         Ordered    doxycycline (VIBRAMYCIN) 100 MG capsule  2 times daily     09/22/18 1707           Bethann BerkshireZammit, Batool Majid, MD 09/22/18 1716    Bethann BerkshireZammit, Rama Mcclintock, MD 09/22/18 1717

## 2018-09-22 NOTE — Discharge Instructions (Addendum)
Keep your leg elevated above your heart and follow-up with your doctor or provider beginning of next week for recheck

## 2018-09-22 NOTE — ED Triage Notes (Signed)
C/o right leg swelling with redness and warmness x 1 week

## 2018-10-03 ENCOUNTER — Other Ambulatory Visit (HOSPITAL_COMMUNITY)
Admission: RE | Admit: 2018-10-03 | Discharge: 2018-10-03 | Disposition: A | Discharge status: Home / Self Care | Payer: Self-pay | Source: Ambulatory Visit | Attending: Physician Assistant | Admitting: Physician Assistant

## 2018-10-03 DIAGNOSIS — I10 Essential (primary) hypertension: Secondary | ICD-10-CM | POA: Insufficient documentation

## 2018-10-03 DIAGNOSIS — E785 Hyperlipidemia, unspecified: Secondary | ICD-10-CM | POA: Insufficient documentation

## 2018-10-03 LAB — COMPREHENSIVE METABOLIC PANEL
ALT: 11 U/L (ref 0–44)
AST: 15 U/L (ref 15–41)
Albumin: 3.7 g/dL (ref 3.5–5.0)
Alkaline Phosphatase: 134 U/L — ABNORMAL HIGH (ref 38–126)
Anion gap: 10 (ref 5–15)
BUN: 14 mg/dL (ref 6–20)
CO2: 21 mmol/L — AB (ref 22–32)
Calcium: 9.3 mg/dL (ref 8.9–10.3)
Chloride: 105 mmol/L (ref 98–111)
Creatinine, Ser: 0.78 mg/dL (ref 0.44–1.00)
GFR calc Af Amer: >60 mL/min (ref >60)
GFR calc non Af Amer: >60 mL/min (ref >60)
GLUCOSE: 119 mg/dL — AB (ref 70–99)
Potassium: 3.4 mmol/L — ABNORMAL LOW (ref 3.5–5.1)
SODIUM: 136 mmol/L (ref 135–145)
Total Bilirubin: 0.6 mg/dL (ref 0.3–1.2)
Total Protein: 7.1 g/dL (ref 6.5–8.1)

## 2018-10-03 LAB — LIPID PANEL
Cholesterol: 215 mg/dL — ABNORMAL HIGH (ref 0–200)
HDL: 24 mg/dL — ABNORMAL LOW (ref >40)
LDL Cholesterol: 132 mg/dL — ABNORMAL HIGH (ref 0–99)
Total CHOL/HDL Ratio: 9 RATIO
Triglycerides: 293 mg/dL — ABNORMAL HIGH (ref <150)
VLDL: 59 mg/dL — ABNORMAL HIGH (ref 0–40)

## 2018-10-04 ENCOUNTER — Encounter: Payer: Self-pay | Admitting: Physician Assistant

## 2018-10-04 ENCOUNTER — Ambulatory Visit: Payer: Self-pay | Admitting: Physician Assistant

## 2018-10-04 VITALS — BP 128/84 | HR 101 | Temp 97.7°F | Ht 67.0 in | Wt 269.5 lb

## 2018-10-04 DIAGNOSIS — F329 Major depressive disorder, single episode, unspecified: Secondary | ICD-10-CM

## 2018-10-04 DIAGNOSIS — Z1239 Encounter for other screening for malignant neoplasm of breast: Secondary | ICD-10-CM

## 2018-10-04 DIAGNOSIS — F17200 Nicotine dependence, unspecified, uncomplicated: Secondary | ICD-10-CM

## 2018-10-04 DIAGNOSIS — G43909 Migraine, unspecified, not intractable, without status migrainosus: Secondary | ICD-10-CM

## 2018-10-04 DIAGNOSIS — F32A Depression, unspecified: Secondary | ICD-10-CM

## 2018-10-04 DIAGNOSIS — E785 Hyperlipidemia, unspecified: Secondary | ICD-10-CM

## 2018-10-04 DIAGNOSIS — I10 Essential (primary) hypertension: Secondary | ICD-10-CM

## 2018-10-04 MED ORDER — ATORVASTATIN CALCIUM 40 MG PO TABS: 40.0000 mg | ORAL_TABLET | Freq: Every day | ORAL | 1 refills | Status: DC

## 2018-10-04 NOTE — Progress Notes (Signed)
BP 128/84 (BP Location: Left Arm, Patient Position: Sitting, Cuff Size: Large)   Pulse (!) 101   Temp 97.7 F (36.5 C) (Other (Comment))   Ht 5\' 7"  (1.702 m)   Wt 269 lb 8 oz (122.2 kg)   LMP 12/19/2014   SpO2 97%   BMI 42.21 kg/m    Subjective:    Patient ID: Dominique Harrington, female    DOB: [birth year], 48 y.o.   MRN: [id number]  HPI: Dominique Harrington is a 48 y.o. female presenting on 10/04/2018 for Follow-up (routine and ER)   HPI   Pt was seen in ER for LE swelling.  Pt states LE swelling somewhat better but not completely gone.  She has one more dose of her doxycycline.   She says otherwise she is doing pretty well.  She is getting around and she got a stationary bicycle for Christmas.  She is still smoking.   Relevant past medical, surgical, family and social history reviewed and updated as indicated. Interim medical history since our last visit reviewed. Allergies and medications reviewed and updated.   Current Outpatient Medications:  .  Acetaminophen-Caffeine (EXCEDRIN TENSION HEADACHE PO), Take by mouth., Disp: , Rfl:  .  amitriptyline (ELAVIL) 10 MG tablet, Take 1 tablet (10 mg total) by mouth at bedtime., Disp: 30 tablet, Rfl: 3 .  aspirin EC 81 MG tablet, Take 81 mg by mouth daily., Disp: , Rfl:  .  atorvastatin (LIPITOR) 20 MG tablet, Take 1 tablet (20 mg total) by mouth daily. (Patient taking differently: Take 20 mg by mouth at bedtime. ), Disp: 90 tablet, Rfl: 3 .  doxycycline (VIBRAMYCIN) 100 MG capsule, Take 1 capsule (100 mg total) by mouth 2 (two) times daily. One po bid x 7 days, Disp: 20 capsule, Rfl: 0 .  fenofibrate (TRICOR) 145 MG tablet, Take 1 tablet (145 mg total) by mouth daily., Disp: 30 tablet, Rfl: 4 .  lisinopril-hydrochlorothiazide (ZESTORETIC) 20-12.5 MG tablet, Take 1 tablet by mouth daily., Disp: 1 tablet, Rfl: 00 .  Omega-3 Fatty Acids (FISH OIL) 1000 MG CAPS, Take 2,000 mg by mouth 2 (two) times daily., Disp: , Rfl:  .  promethazine  (PHENERGAN) 25 MG tablet, TAKE 1 TABLET BY MOUTH EVERY 6 HOURS AS NEEDED FOR NAUSEA AND VOMITING, Disp: 30 tablet, Rfl: 0    Review of Systems  Constitutional: Positive for appetite change and fatigue. Negative for chills, diaphoresis, fever and unexpected weight change.  HENT: Negative for congestion, drooling, ear pain, facial swelling, hearing loss, mouth sores, sneezing, sore throat, trouble swallowing and voice change.   Eyes: Negative for pain, discharge, redness, itching and visual disturbance.  Respiratory: Negative for cough, choking, shortness of breath and wheezing.   Cardiovascular: Positive for leg swelling. Negative for chest pain and palpitations.  Gastrointestinal: Positive for vomiting. Negative for abdominal pain, blood in stool, constipation and diarrhea.  Endocrine: Negative for cold intolerance, heat intolerance and polydipsia.  Genitourinary: Negative for decreased urine volume, dysuria and hematuria.  Musculoskeletal: Positive for gait problem. Negative for arthralgias and back pain.  Skin: Negative for rash.  Allergic/Immunologic: Negative for environmental allergies.  Neurological: Positive for headaches. Negative for seizures, syncope and light-headedness.  Hematological: Negative for adenopathy.  Psychiatric/Behavioral: Positive for dysphoric mood. Negative for agitation and suicidal ideas. The patient is nervous/anxious.     Per HPI unless specifically indicated above     Objective:    BP 128/84 (BP Location: Left Arm, Patient Position: Sitting, Cuff Size: Large)  Pulse (!) 101   Temp 97.7 F (36.5 C) (Other (Comment))   Ht 5\' 7"  (1.702 m)   Wt 269 lb 8 oz (122.2 kg)   LMP 12/19/2014   SpO2 97%   BMI 42.21 kg/m   Wt Readings from Last 3 Encounters:  10/04/18 269 lb 8 oz (122.2 kg)  09/22/18 260 lb (117.9 kg)  08/10/18 260 lb (117.9 kg)    Physical Exam Vitals signs reviewed.  Constitutional:      Appearance: She is well-developed.  HENT:      Head: Normocephalic and atraumatic.  Neck:     Musculoskeletal: Neck supple.  Cardiovascular:     Rate and Rhythm: Normal rate and regular rhythm.     Pulses:          Dorsalis pedis pulses are 2+ on the right side.  Pulmonary:     Effort: Pulmonary effort is normal.     Breath sounds: Normal breath sounds.  Abdominal:     General: Bowel sounds are normal.     Palpations: Abdomen is soft. There is no mass.     Tenderness: There is no abdominal tenderness.  Musculoskeletal:     Right ankle: She exhibits swelling.     Comments: R foot and ankle are very mildly larger than the L.  It is also very mildly pinker than the L.  The L is mottled.   No swelling of the calf.  No pitting edema.    Lymphadenopathy:     Cervical: No cervical adenopathy.  Skin:    General: Skin is warm and dry.  Neurological:     Mental Status: She is alert and oriented to person, place, and time.  Psychiatric:        Behavior: Behavior normal.     Results for orders placed or performed during the hospital encounter of 10/03/18  Lipid panel  Result Value Ref Range   Cholesterol 215 (H) 0 - 200 mg/dL   Triglycerides 673 (H) <150 mg/dL   HDL 24 (L) >41 mg/dL   Total CHOL/HDL Ratio 9.0 RATIO   VLDL 59 (H) 0 - 40 mg/dL   LDL Cholesterol 937 (H) 0 - 99 mg/dL  Comprehensive metabolic panel  Result Value Ref Range   Sodium 136 135 - 145 mmol/L   Potassium 3.4 (L) 3.5 - 5.1 mmol/L   Chloride 105 98 - 111 mmol/L   CO2 21 (L) 22 - 32 mmol/L   Glucose, Bld 119 (H) 70 - 99 mg/dL   BUN 14 6 - 20 mg/dL   Creatinine, Ser 9.02 0.44 - 1.00 mg/dL   Calcium 9.3 8.9 - 40.9 mg/dL   Total Protein 7.1 6.5 - 8.1 g/dL   Albumin 3.7 3.5 - 5.0 g/dL   AST 15 15 - 41 U/L   ALT 11 0 - 44 U/L   Alkaline Phosphatase 134 (H) 38 - 126 U/L   Total Bilirubin 0.6 0.3 - 1.2 mg/dL   GFR calc non Af Amer >60 >60 mL/min   GFR calc Af Amer >60 >60 mL/min   Anion gap 10 5 - 15      Assessment & Plan:    Encounter Diagnoses  Name  Primary?  . Essential hypertension, benign Yes  . Hyperlipidemia, unspecified hyperlipidemia type   . Migraine syndrome   . Depression, unspecified depression type   . Tobacco use disorder   . Morbid obesity (HCC)   . Screening for breast cancer     -reviewed labs  with pt  -will Increase atorvastatin to 40mg .  Continue fenofibrate and lowfat diet  -counseled Smoking cessation.  Discussed risks of continue smoking including vascular damage  -Pt [facility] application [facility]  -Pt to scheduled f/u with dr Everlena CooperJaffe  -pt counseled to Exercise regularly  -will Re-order mammogram as pt says she never got call to schedule appointment  -pt to continue current BP meds and other meds (statin increase is only change today)  -pt to follow up  3 months.  RTO sooner prn.  Pt counseled to RTO for any worsening of the RLE

## 2018-10-28 ENCOUNTER — Other Ambulatory Visit: Payer: Self-pay | Admitting: Obstetrics and Gynecology

## 2018-10-28 DIAGNOSIS — Z1231 Encounter for screening mammogram for malignant neoplasm of breast: Secondary | ICD-10-CM

## 2018-11-10 IMAGING — MR MR HEAD W/O CM
1 series · 16 of 48 positions shown · non-contrast
Comparison: CT head 09/12/2017.

CLINICAL DATA: Slurred speech, LEFT-sided numbness, and dizziness.
This began 09/12/2017.

EXAM:
MRI HEAD WITHOUT CONTRAST
MRA HEAD WITHOUT CONTRAST
TECHNIQUE: Multiplanar, multiecho pulse sequences of the brain and surrounding
structures were obtained without intravenous contrast. Angiographic
images of the head were obtained using MRA technique without
contrast.

[Series 1: MRA · axial · 0.8mm · 0.38mm/px · z∈[-34,+64]mm · 16 of 131 slices shown]
[im 1/131]
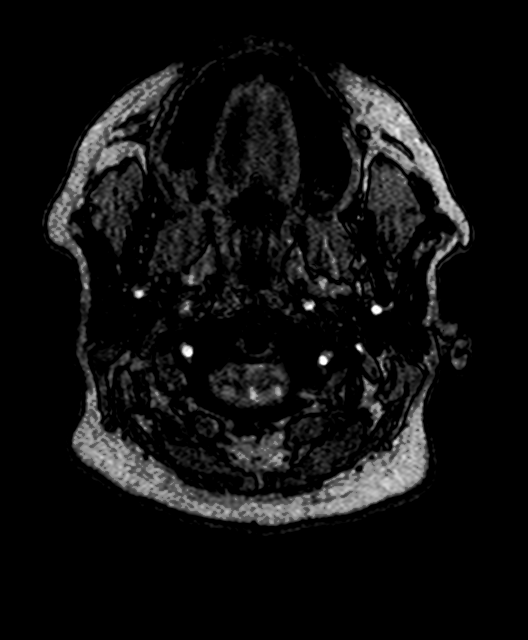
[im 3/131]
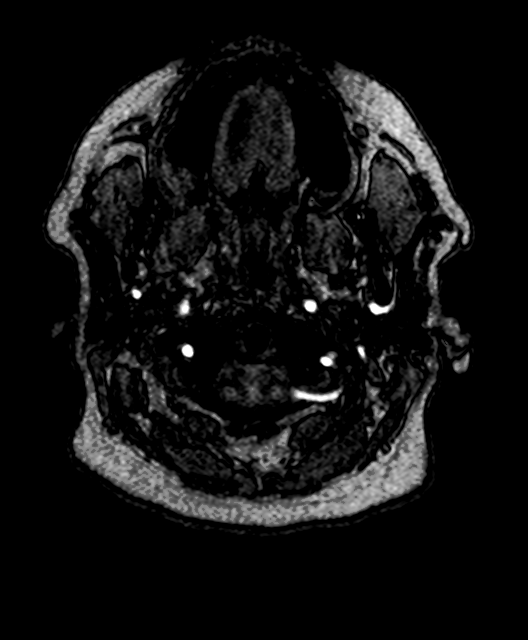
[im 6/131]
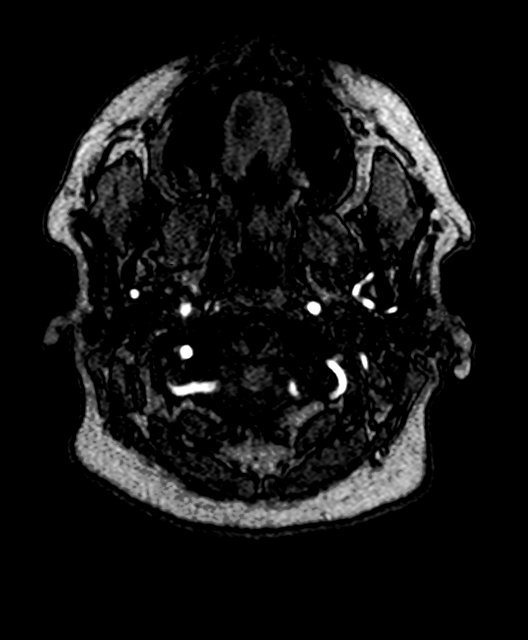
[im 9/131]
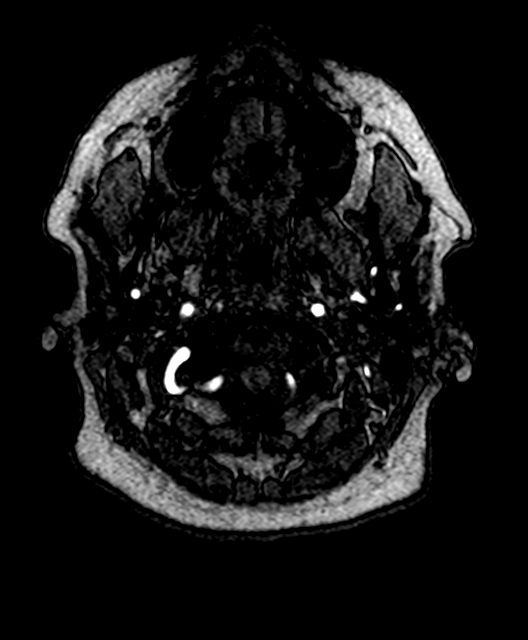
[im 12/131]
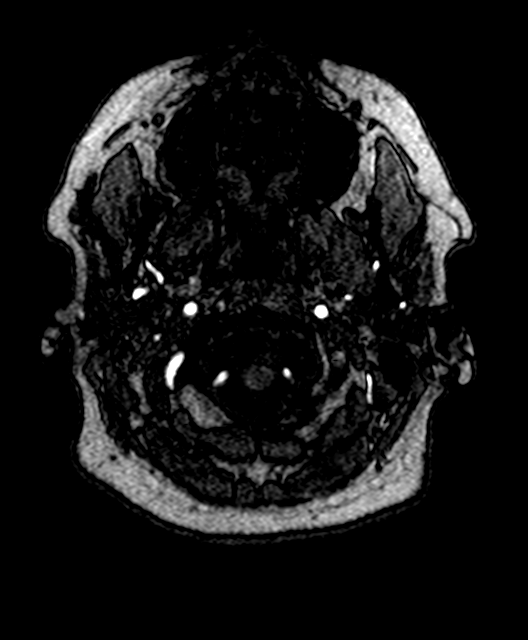
[im 14/131]
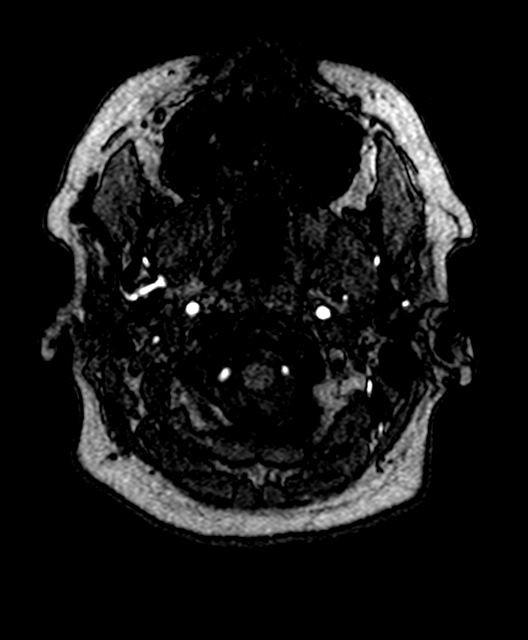
[im 23/131]
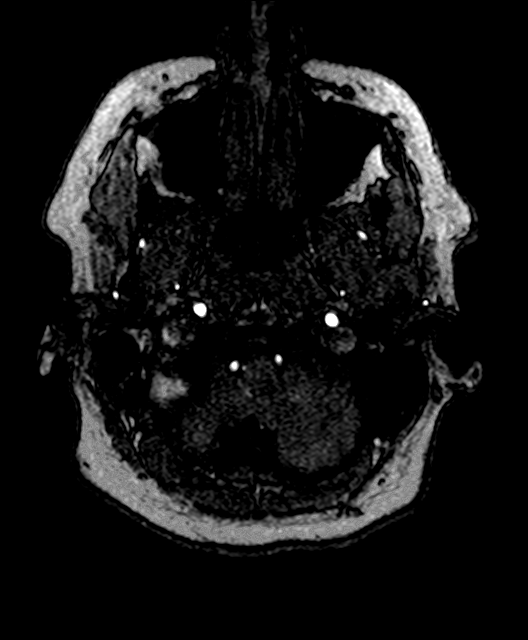
[im 25/131]
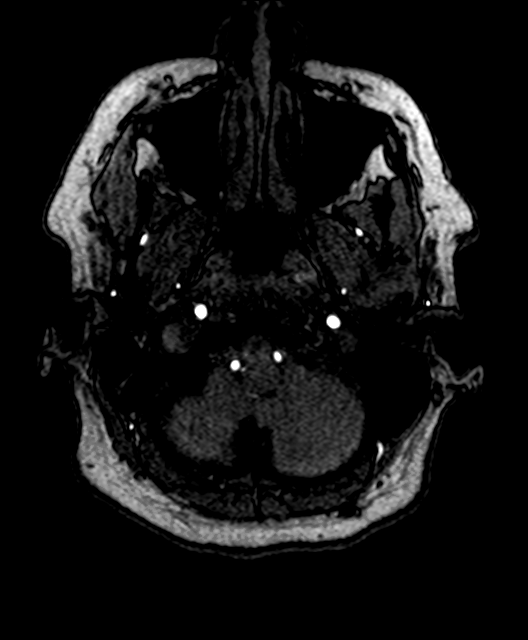
[im 42/131]
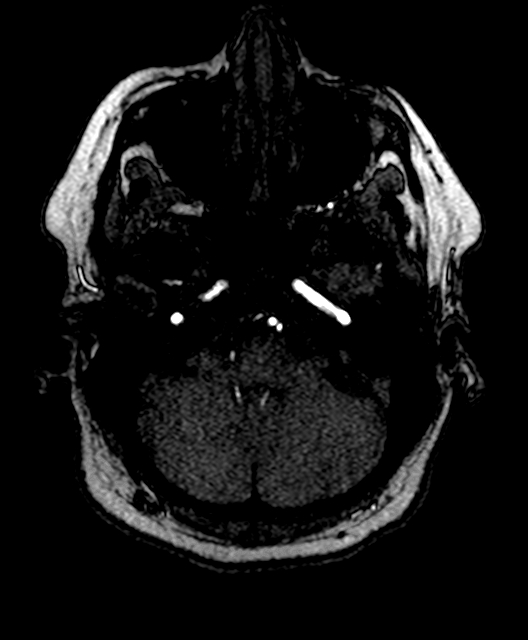
[im 59/131]
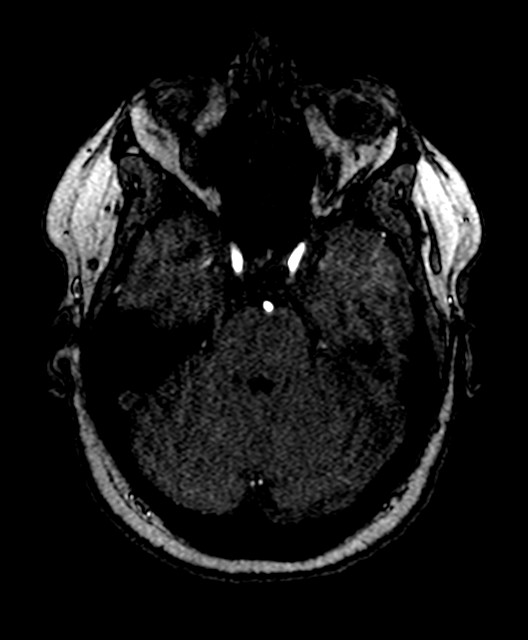
[im 67/131]
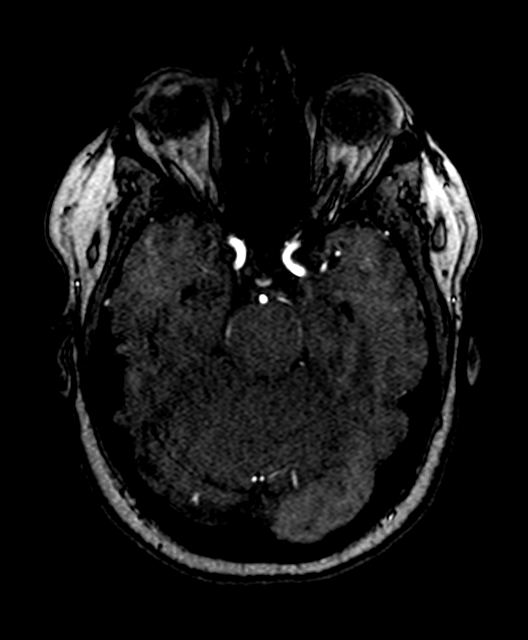
[im 75/131]
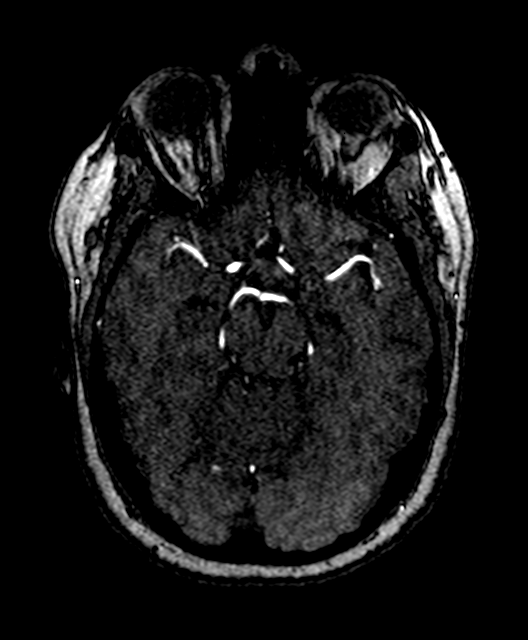
[im 92/131]
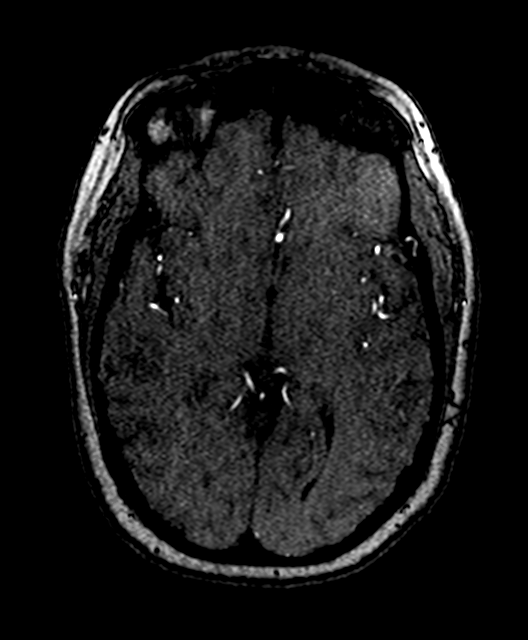
[im 108/131]
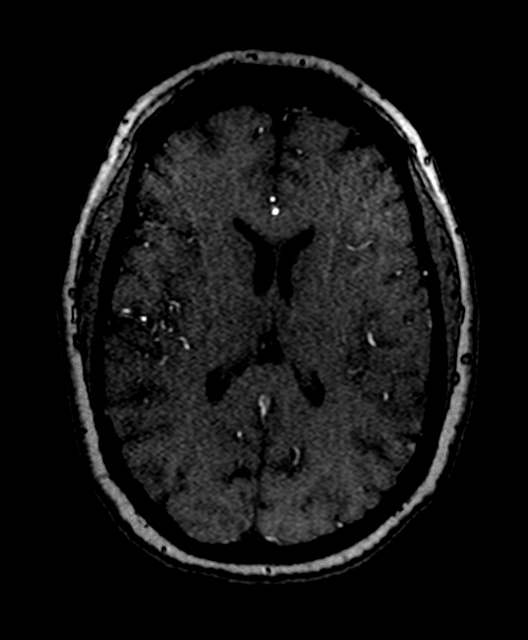
[im 111/131]
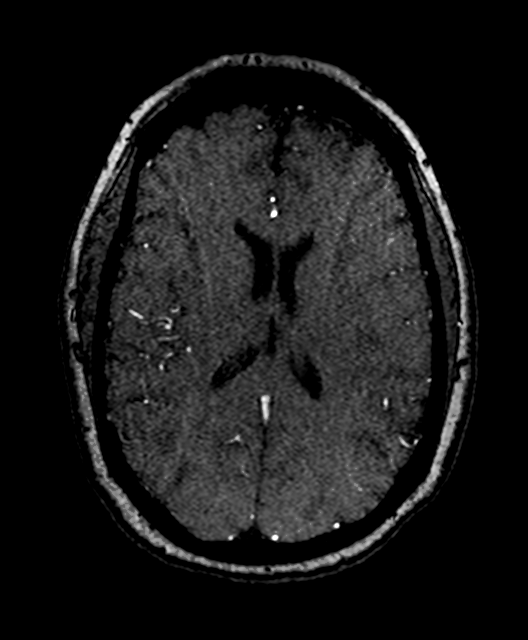
[im 125/131]
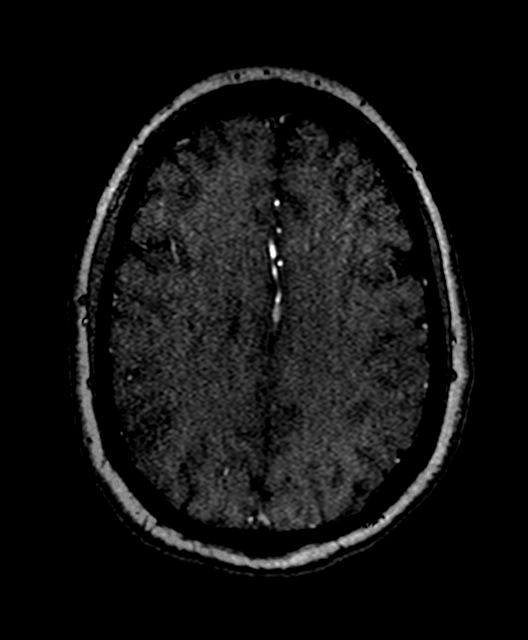

[16 of 48 positions shown; findings below may reference images not displayed]

FINDINGS: MRI HEAD FINDINGS

Brain: No acute infarction, hemorrhage, hydrocephalus, extra-axial
collection or mass lesion. Normal for age cerebral volume. No
significant white matter disease.

Vascular: Flow voids are maintained throughout the carotid, basilar,
and vertebral arteries. There are no areas of chronic hemorrhage.

Skull and upper cervical spine: Unremarkable visualized calvarium,
skullbase, and cervical vertebrae. Pituitary, pineal, cerebellar
tonsils unremarkable. No upper cervical cord lesions.

Sinuses/Orbits: No orbital masses or proptosis. Globes appear
symmetric. Sinuses appear well aerated, without evidence for
air-fluid level.

Other: None.

MRA HEAD FINDINGS

The internal carotid arteries are widely patent. The basilar artery
is widely patent with vertebrals both contributing, RIGHT slightly
larger. There is no proximal stenosis of the anterior, middle, or
posterior cerebral arteries. No saccular aneurysm.
IMPRESSION: No acute intracranial abnormality. No cause is seen for the reported
symptoms.

Unremarkable MRA of the intracranial circulation.

## 2018-12-19 ENCOUNTER — Ambulatory Visit: Payer: Self-pay | Admitting: Physician Assistant

## 2018-12-19 ENCOUNTER — Encounter: Payer: Self-pay | Admitting: Physician Assistant

## 2018-12-19 DIAGNOSIS — L03115 Cellulitis of right lower limb: Secondary | ICD-10-CM

## 2018-12-19 MED ORDER — DOXYCYCLINE HYCLATE 100 MG PO TABS: 100.0000 mg | ORAL_TABLET | Freq: Two times a day (BID) | ORAL | 0 refills | Status: DC

## 2018-12-19 NOTE — Progress Notes (Signed)
LMP 12/19/2014    Subjective:    Patient ID: Dominique Harrington, female    DOB: 04-25-1971, 48 y.o.   MRN: [id number]  HPI: Dominique Harrington is a 48 y.o. female presenting on 12/19/2018 for No chief complaint on file.   HPI   This is a telemedicine appointment through [employer] due to coronavirus pandemic  Pt c/o R lower leg swelling.  She says she had same thing in December.   Pt started with RLE swelling started Thursday.  She says It hurts.  She describes it as feeling tight.  She reports Running fevers on and off.  tmax 101.  Taking IBU.  No fever today- T 98.0.  She says the area is pinkish.  Pt denies  SOB.  She is a smoker.  She says she gets up and walks around regularly throughout the day.  She also takes asa 81mg  daily.  Relevant past medical, surgical, family and social history reviewed and updated as indicated. Interim medical history since our last visit reviewed. Allergies and medications reviewed and updated.   Current Outpatient Medications:  .  amitriptyline (ELAVIL) 10 MG tablet, Take 1 tablet (10 mg total) by mouth at bedtime., Disp: 30 tablet, Rfl: 3 .  aspirin EC 81 MG tablet, Take 81 mg by mouth daily., Disp: , Rfl:  .  atorvastatin (LIPITOR) 40 MG tablet, Take 1 tablet (40 mg total) by mouth daily., Disp: 90 tablet, Rfl: 1 .  fenofibrate (TRICOR) 145 MG tablet, Take 1 tablet (145 mg total) by mouth daily., Disp: 30 tablet, Rfl: 4 .  lisinopril-hydrochlorothiazide (ZESTORETIC) 20-12.5 MG tablet, Take 1 tablet by mouth daily., Disp: 1 tablet, Rfl: 00 .  Omega-3 Fatty Acids (FISH OIL) 1000 MG CAPS, Take 2,000 mg by mouth 2 (two) times daily., Disp: , Rfl:  .  promethazine (PHENERGAN) 25 MG tablet, TAKE 1 TABLET BY MOUTH EVERY 6 HOURS AS NEEDED FOR NAUSEA AND VOMITING, Disp: 30 tablet, Rfl: 0 .  Acetaminophen-Caffeine (EXCEDRIN TENSION HEADACHE PO), Take by mouth., Disp: , Rfl:      Review of Systems  Per HPI unless specifically indicated above     Objective:     LMP 12/19/2014   Wt Readings from Last 3 Encounters:  10/04/18 269 lb 8 oz (122.2 kg)  09/22/18 260 lb (117.9 kg)  08/10/18 260 lb (117.9 kg)    Physical Exam HENT:     Head: Normocephalic and atraumatic.  Pulmonary:     Effort: Pulmonary effort is normal. No respiratory distress.  Musculoskeletal:     Right lower leg: Edema present.     Comments: RLE swelled from foot to knee compared with LLE.  Erythema difficult due to poor lighting in the patient's home.  There are no open wounds seen.  Neurological:     Mental Status: She is alert and oriented to person, place, and time.  Psychiatric:        Mood and Affect: Mood normal.        Behavior: Behavior normal.         Assessment & Plan:    Encounter Diagnosis  Name Primary?  . Cellulitis of right lower extremity Yes     -discussed with pt.  She had negative [place] in December.  Pt feels confident same thing going on now.  Will rx doxycycline.  Pt to elevate leg and use warm compresses.  She is encouraged to walk around at least hourly -pt is to Call office if it gets worse  or she develops new symptoms like SOB -pt states understanding and agrees with plan

## 2019-01-09 ENCOUNTER — Encounter: Payer: Self-pay | Admitting: Physician Assistant

## 2019-01-09 ENCOUNTER — Ambulatory Visit: Payer: Self-pay | Admitting: Physician Assistant

## 2019-01-09 DIAGNOSIS — I10 Essential (primary) hypertension: Secondary | ICD-10-CM

## 2019-01-09 DIAGNOSIS — G43009 Migraine without aura, not intractable, without status migrainosus: Secondary | ICD-10-CM

## 2019-01-09 DIAGNOSIS — E785 Hyperlipidemia, unspecified: Secondary | ICD-10-CM

## 2019-01-09 MED ORDER — PROMETHAZINE HCL 25 MG PO TABS: ORAL_TABLET | ORAL | 0 refills | Status: DC

## 2019-01-09 MED ORDER — BUTALBITAL-APAP-CAFFEINE 50-325-40 MG PO TABS: 1.0000 | ORAL_TABLET | Freq: Four times a day (QID) | ORAL | 0 refills | Status: DC | PRN

## 2019-01-09 NOTE — Progress Notes (Signed)
LMP 12/19/2014    Subjective:    Patient ID: Dominique Harrington, female    DOB: Jun 29, 1971, 48 y.o.   MRN: [id number]  HPI: Dominique Harrington is a 48 y.o. female presenting on 01/09/2019 for No chief complaint on file.   HPI This is a telemedicine appointment through [employer] due to the coronavirus pandemic.  I connected with  Dominique Harrington on 01/10/19 by a video enabled telemedicine application and verified that I am speaking with the correct person using two identifiers.   I discussed the limitations of evaluation and management by telemedicine. The patient expressed understanding and agreed to proceed.   Pt quit smoking 2 wk ago.  She is using patches.     She is walking a lot (doing laps around her house)  She has [facility]  Pt says she has a migraine.  She went to neurology in June but didn't go to her 3 month follow up.  She says her migrain has been going for 3 days.  It didn't resolve with exedrin.  She was taken off of triptans due to history of stroke.  Neurology put her on amitriptyline for this.    Pt states RLE cellulitis is all resolved now (from 12/19/18)  Her bp is good-  Running around 130/85  She is checking it at home.   Relevant past medical, surgical, family and social history reviewed and updated as indicated. Interim medical history since our last visit reviewed. Allergies and medications reviewed and updated.    Current Outpatient Medications:  .  Acetaminophen-Caffeine (EXCEDRIN TENSION HEADACHE PO), Take by mouth., Disp: , Rfl:  .  amitriptyline (ELAVIL) 10 MG tablet, Take 1 tablet (10 mg total) by mouth at bedtime., Disp: 30 tablet, Rfl: 3 .  aspirin EC 81 MG tablet, Take 81 mg by mouth daily., Disp: , Rfl:  .  atorvastatin (LIPITOR) 40 MG tablet, Take 1 tablet (40 mg total) by mouth daily., Disp: 90 tablet, Rfl: 1 .  fenofibrate (TRICOR) 145 MG tablet, Take 1 tablet (145 mg total) by mouth daily., Disp: 30 tablet, Rfl: 4 .   lisinopril-hydrochlorothiazide (ZESTORETIC) 20-12.5 MG tablet, Take 1 tablet by mouth daily., Disp: 1 tablet, Rfl: 00 .  Omega-3 Fatty Acids (FISH OIL) 1000 MG CAPS, Take 2,000 mg by mouth 2 (two) times daily., Disp: , Rfl:  .  promethazine (PHENERGAN) 25 MG tablet, TAKE 1 TABLET BY MOUTH EVERY 6 HOURS AS NEEDED FOR NAUSEA AND VOMITING, Disp: 30 tablet, Rfl: 0    Review of Systems  Per HPI unless specifically indicated above     Objective:    LMP 12/19/2014   Wt Readings from Last 3 Encounters:  10/04/18 269 lb 8 oz (122.2 kg)  09/22/18 260 lb (117.9 kg)  08/10/18 260 lb (117.9 kg)    Physical Exam Constitutional:      General: She is not in acute distress.    Appearance: She is not ill-appearing.  HENT:     Head: Normocephalic and atraumatic.  Pulmonary:     Effort: Pulmonary effort is normal. No respiratory distress.     Comments: Pt  Speaks in full sentences without SOB or having to stop to rest. Neurological:     Mental Status: She is alert and oriented to person, place, and time.  Psychiatric:        Mood and Affect: Mood normal.        Behavior: Behavior normal.  Assessment & Plan:   Encounter Diagnoses  Name Primary?  . Essential hypertension, benign Yes  . Hyperlipidemia, unspecified hyperlipidemia type   . Migraine without aura and without status migrainosus, not intractable      -Congratulated pt on stopping smoking!  Encouraged her to continue to abstain  -pt Requests phenergan and fioricet which she had in the past for her migraine.  rx sent to [facility] [place]  -pt to continue current meds.  Will defer labs at this time due to CV19  -Pt counseled to call to schedule follow up with neurology (she says she has [facility])  -pt to follow up here 3 months.  She is to contact office sooner prn

## 2019-02-16 ENCOUNTER — Other Ambulatory Visit (HOSPITAL_COMMUNITY): Payer: Self-pay | Admitting: Physician Assistant

## 2019-02-16 ENCOUNTER — Ambulatory Visit: Payer: Self-pay | Admitting: Physician Assistant

## 2019-02-16 ENCOUNTER — Encounter: Payer: Self-pay | Admitting: Physician Assistant

## 2019-02-16 ENCOUNTER — Other Ambulatory Visit: Payer: Self-pay | Admitting: Physician Assistant

## 2019-02-16 DIAGNOSIS — L03115 Cellulitis of right lower limb: Secondary | ICD-10-CM

## 2019-02-16 DIAGNOSIS — M7989 Other specified soft tissue disorders: Secondary | ICD-10-CM

## 2019-02-16 DIAGNOSIS — J069 Acute upper respiratory infection, unspecified: Secondary | ICD-10-CM

## 2019-02-16 MED ORDER — DOXYCYCLINE HYCLATE 100 MG PO TABS: 100.0000 mg | ORAL_TABLET | Freq: Two times a day (BID) | ORAL | 0 refills | Status: DC

## 2019-02-16 NOTE — Progress Notes (Signed)
LMP 12/19/2014    Subjective:    Patient ID: Dominique Harrington, female    DOB: Sep 11, 1971, 48 y.o.   MRN: [id number]  HPI: Dominique HasteCarrie P Yett is a 48 y.o. female presenting on 02/16/2019 for No chief complaint on file.   HPI   This is a telemedicine visit through [employer] due to coronavirus pandemic  I connected with  Dominique Hastearrie P Hayes on 02/16/19 by a video enabled telemedicine application and verified that I am speaking with the correct person using two identifiers.   I discussed the limitations of evaluation and management by telemedicine. The patient expressed understanding and agreed to proceed.   Pt is at home.  Provider is at office/clinic  Pt states RLE swelling started swelling again on Tuesday this week.  It is red.  swelling of Foot and ankle To about 4 inches above the ankle.  Pt has had No SOB.  She has no fever  She has a cold with chest congestion.    She has been out running around a lot this week but says she was wearing a mask.   She has not been in contact with anyone with CV19 to her knowledge.   Relevant past medical, surgical, family and social history reviewed and updated as indicated. Interim medical history since our last visit reviewed. Allergies and medications reviewed and updated.    Current Outpatient Medications:  .  Acetaminophen-Caffeine (EXCEDRIN TENSION HEADACHE PO), Take by mouth., Disp: , Rfl:  .  aspirin EC 81 MG tablet, Take 81 mg by mouth daily., Disp: , Rfl:  .  atorvastatin (LIPITOR) 40 MG tablet, Take 1 tablet (40 mg total) by mouth daily., Disp: 90 tablet, Rfl: 1 .  butalbital-acetaminophen-caffeine (FIORICET, ESGIC) 50-325-40 MG tablet, Take 1-2 tablets by mouth every 6 (six) hours as needed for headache. (maximum 6 tabs/24 hour), Disp: 20 tablet, Rfl: 0 .  fenofibrate (TRICOR) 145 MG tablet, Take 1 tablet (145 mg total) by mouth daily., Disp: 30 tablet, Rfl: 4 .  lisinopril-hydrochlorothiazide (ZESTORETIC) 20-12.5 MG tablet, Take 1 tablet by  mouth daily., Disp: 1 tablet, Rfl: 00 .  Omega-3 Fatty Acids (FISH OIL) 1000 MG CAPS, Take 2,000 mg by mouth 2 (two) times daily., Disp: , Rfl:  .  promethazine (PHENERGAN) 25 MG tablet, TAKE 1 TABLET BY MOUTH EVERY 6 HOURS AS NEEDED FOR NAUSEA AND VOMITING, Disp: 30 tablet, Rfl: 0 .  amitriptyline (ELAVIL) 10 MG tablet, Take 1 tablet (10 mg total) by mouth at bedtime. (Patient not taking: Reported on 02/16/2019), Disp: 30 tablet, Rfl: 3     Review of Systems  Per HPI unless specifically indicated above     Objective:    LMP 12/19/2014   Wt Readings from Last 3 Encounters:  10/04/18 269 lb 8 oz (122.2 kg)  09/22/18 260 lb (117.9 kg)  08/10/18 260 lb (117.9 kg)    Physical Exam Constitutional:      General: She is not in acute distress.    Appearance: She is obese. She is not ill-appearing.  HENT:     Head: Normocephalic and atraumatic.  Pulmonary:     Effort: Pulmonary effort is normal. No respiratory distress.  Musculoskeletal:     Right lower leg: Edema present.       Legs:     Comments: Some swelling RLE noted.  Not significantly red but lighting is poor so unable to note subtle redness  Neurological:     Mental Status: She is alert and oriented to  person, place, and time.  Psychiatric:        Attention and Perception: Attention normal.        Mood and Affect: Mood normal.        Speech: Speech normal.        Behavior: Behavior is cooperative.           Assessment & Plan:    Encounter Diagnoses  Name Primary?  . Cellulitis of right lower extremity Yes  . Swelling of lower leg   . Upper respiratory tract infection, unspecified type     -will rx doxycycline to  [facility] for lower extremitiy  -Will Get ABI of this is third time this has happened.  Will wait until antibiotic resolved before getting test done  -Pt to check on dates for [facility]  -pt counseled to self isolate for a week to make sure she doesn't worsen or develop fever - -pt to  follow up July as scheduled.  She is to contact office sooner for any worsening or new symptoms.

## 2019-02-21 ENCOUNTER — Ambulatory Visit (HOSPITAL_COMMUNITY): Payer: Self-pay

## 2019-02-21 ENCOUNTER — Ambulatory Visit: Payer: Self-pay | Admitting: Physician Assistant

## 2019-02-21 ENCOUNTER — Encounter: Payer: Self-pay | Admitting: Physician Assistant

## 2019-02-21 DIAGNOSIS — L03115 Cellulitis of right lower limb: Secondary | ICD-10-CM

## 2019-02-21 NOTE — Progress Notes (Signed)
LMP 12/19/2014    Subjective:    Patient ID: Dominique Harrington, female    DOB: 11-27-1970, 48 y.o.   MRN: [id number]  HPI: Dominique Harrington is a 48 y.o. female presenting on 02/21/2019 for No chief complaint on file.   HPI   This is a telemedicine appointment through [employer] due to coronavirus pandemic.  I connected with  Dominique Harrington on 02/21/19 by a video enabled telemedicine application and verified that I am speaking with the correct person using two identifiers.   I discussed the limitations of evaluation and management by telemedicine. The patient expressed understanding and agreed to proceed.   Pt is at home.  Provider is at office/clinic  Last week pt was treated on thursday 02/16/19 with doxycycline for RLE cellulitis.  She says the swelling has improved a little bit.  She say Now the LLE is swelling some but not as much as the RLE.  There is slight redness RLE no rednes of the LLE.  No SOB.  No fevers.  She has been home since she was seen last thurday.  .  She has been elevating the leg. She is walking around some.   Her cough and URI has improved some since last Thursday also.    Relevant past medical, surgical, family and social history reviewed and updated as indicated. Interim medical history since our last visit reviewed. Allergies and medications reviewed and updated.    Current Outpatient Medications:  .  Acetaminophen-Caffeine (EXCEDRIN TENSION HEADACHE PO), Take by mouth., Disp: , Rfl:  .  aspirin EC 81 MG tablet, Take 81 mg by mouth daily., Disp: , Rfl:  .  atorvastatin (LIPITOR) 40 MG tablet, Take 1 tablet (40 mg total) by mouth daily., Disp: 90 tablet, Rfl: 1 .  butalbital-acetaminophen-caffeine (FIORICET, ESGIC) 50-325-40 MG tablet, Take 1-2 tablets by mouth every 6 (six) hours as needed for headache. (maximum 6 tabs/24 hour), Disp: 20 tablet, Rfl: 0 .  doxycycline (VIBRA-TABS) 100 MG tablet, Take 1 tablet (100 mg total) by mouth 2 (two) times daily., Disp:  20 tablet, Rfl: 0 .  fenofibrate (TRICOR) 145 MG tablet, Take 1 tablet (145 mg total) by mouth daily., Disp: 30 tablet, Rfl: 4 .  lisinopril-hydrochlorothiazide (ZESTORETIC) 20-12.5 MG tablet, Take 1 tablet by mouth daily., Disp: 1 tablet, Rfl: 00 .  Omega-3 Fatty Acids (FISH OIL) 1000 MG CAPS, Take 2,000 mg by mouth 2 (two) times daily., Disp: , Rfl:  .  promethazine (PHENERGAN) 25 MG tablet, TAKE 1 TABLET BY MOUTH EVERY 6 HOURS AS NEEDED FOR NAUSEA AND VOMITING, Disp: 30 tablet, Rfl: 0    Review of Systems  Per HPI unless specifically indicated above     Objective:    LMP 12/19/2014   Wt Readings from Last 3 Encounters:  10/04/18 269 lb 8 oz (122.2 kg)  09/22/18 260 lb (117.9 kg)  08/10/18 260 lb (117.9 kg)    Physical Exam Constitutional:      General: She is not in acute distress.    Appearance: She is obese. She is not ill-appearing.  HENT:     Head: Normocephalic and atraumatic.  Pulmonary:     Effort: Pulmonary effort is normal. No respiratory distress.  Musculoskeletal:     Right lower leg: She exhibits swelling.     Left lower leg: She exhibits no swelling.     Comments: The RLE is still a bit swelled with comparison to the LLE which does not appear to have any  swelling.  There is no redness of either extremity.   There are no open wounds  Neurological:     Mental Status: She is alert and oriented to person, place, and time.  Psychiatric:        Attention and Perception: Attention normal.        Speech: Speech normal.        Behavior: Behavior is cooperative.        Thought Content: Thought content normal.        Cognition and Memory: Cognition normal.         Assessment & Plan:    Encounter Diagnosis  Name Primary?  . Cellulitis of right lower extremity Yes    -pt is to Continue her doxycycline until complete -she is scheduled for ABI 03/09/19 -pt instructed to Call office for any new symptoms or worsening.  She is reminded to go to ER if she develops  SOB.  Pt is in agreement with plan

## 2019-03-08 ENCOUNTER — Other Ambulatory Visit: Payer: Self-pay

## 2019-03-08 ENCOUNTER — Telehealth: Payer: Self-pay

## 2019-03-08 ENCOUNTER — Telehealth: Payer: Self-pay | Admitting: Student

## 2019-03-08 DIAGNOSIS — Z20822 Contact with and (suspected) exposure to covid-19: Secondary | ICD-10-CM

## 2019-03-08 NOTE — Telephone Encounter (Signed)
Soyla Dryer, PA-C at [facility] called to request the patient to be scheduled for covid testing. The patient was called and advised of the request, appointment scheduled for today at 1245 at [facility], advised of location and to wear a mask for everyone in the vehicle, she verbalized understanding. Order placed.

## 2019-03-08 NOTE — Telephone Encounter (Signed)
LPN called and spoke with patient to notify her that someone from the [place] testing site would call her to schedule her appointment. pt states she had already gotten the call and is already on her way to the testing site for testing.   Pt was notfiied that her f/u appointment with [facility] on 04-10-19 will be changed into an EVISIT appointment. Pt verbalized understanding.  LPN informed patient of a website of where she can go to read about information regarding Caring for Someone Sick at Home from the [facility] website. Pt requests link be sent to her email at [email or web address]. LPN emailed the link to the patient on 03-08-19.

## 2019-03-08 NOTE — Telephone Encounter (Signed)
Pt states her husband went for lab work for his doctor and was called last night (03-07-19) to notify him of his positive COVID test. Pt has started quarantine and is staying at his house where patient and spouse live together.  Pt was advised to quarantine also. Pt is to stay home and have someone from outside her house to buy her groceries for her and leave it at her door. Pt was advised to wait until person left before she went out to retreive her groceries. Pt verbalized understanding.  Pt was advised to go to ER if she experiences confusion or SOB. Pt verbalized understanding.  Pt was given the option to have COVID testing and pt states she does want to be tested. LPN explained to patient that [facility] would have to arrange her testing and someone from the office will give her a call back about that. Pt verbalized understanding.  Pt has appointment scheduled for [place] for tomorrow, 03-09-19. LPN informed pt that LPN will call to have that appointment cancelled due to her likely having COVID. LPN explained to patient that it is best to not reschedule her [place] appointment until she tests negative. Pt verbalized understanding.  Pt has an in person visit with [facility] on 04-10-2019 that will be changed to an EVISIT appointment.

## 2019-03-09 ENCOUNTER — Ambulatory Visit (HOSPITAL_COMMUNITY): Admission: RE | Admit: 2019-03-09 | Payer: Self-pay | Source: Ambulatory Visit

## 2019-03-14 LAB — NOVEL CORONAVIRUS, NAA: SARS-CoV-2, NAA: NOT DETECTED

## 2019-04-03 ENCOUNTER — Other Ambulatory Visit: Payer: Self-pay | Admitting: Physician Assistant

## 2019-04-03 DIAGNOSIS — E785 Hyperlipidemia, unspecified: Secondary | ICD-10-CM

## 2019-04-03 DIAGNOSIS — I10 Essential (primary) hypertension: Secondary | ICD-10-CM

## 2019-04-03 NOTE — Progress Notes (Unsigned)
cmp

## 2019-04-10 ENCOUNTER — Ambulatory Visit: Payer: Self-pay | Admitting: Physician Assistant

## 2019-04-28 IMAGING — US US CAROTID DUPLEX BILAT
1 series · 13 of 24 positions shown · non-contrast
Comparison: Brain MRI 09/13/2017

CLINICAL DATA: 46-year-old female with left-sided weakness,
numbness and dysarthria

EXAM:
BILATERAL CAROTID DUPLEX ULTRASOUND
TECHNIQUE: Gray scale imaging, color Doppler and duplex ultrasound were
performed of bilateral carotid and vertebral arteries in the neck.

[Series 1: us carotid duplex bilat · 0.06mm/px · 13 of 69 slices shown]
[im 1/69]
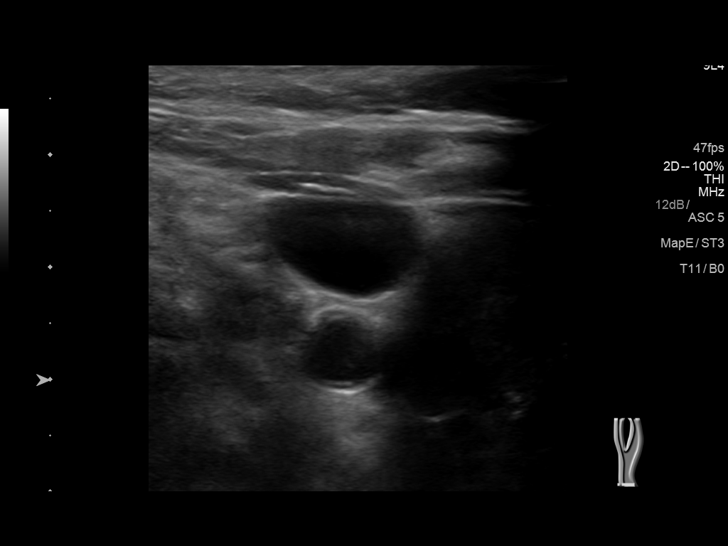
[im 6/69]
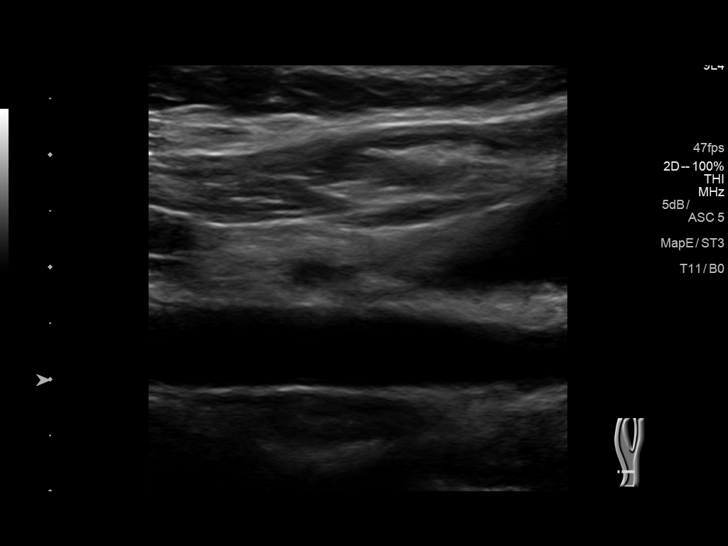
[im 12/69]
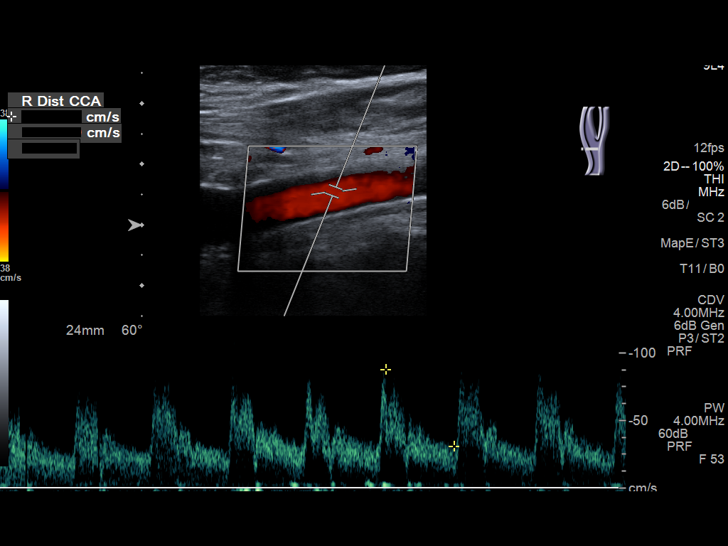
[im 18/69]
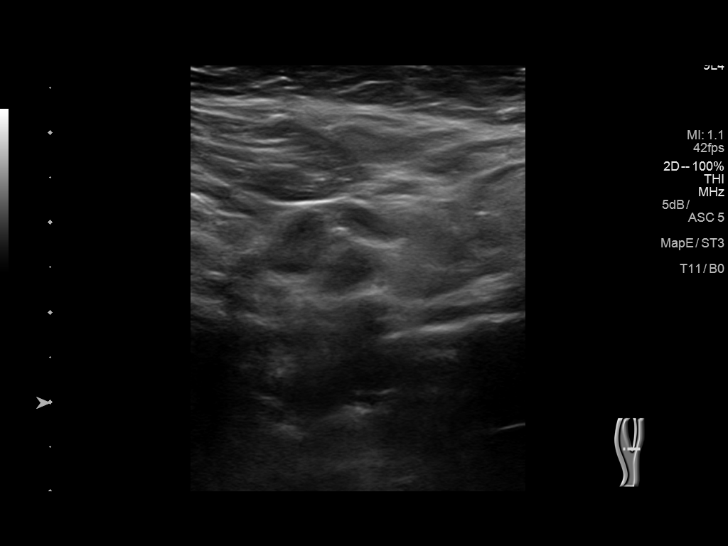
[im 24/69]
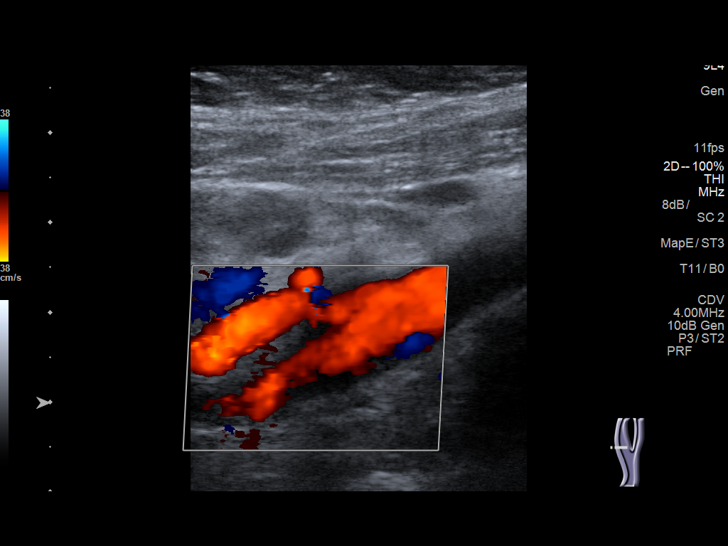
[im 30/69]
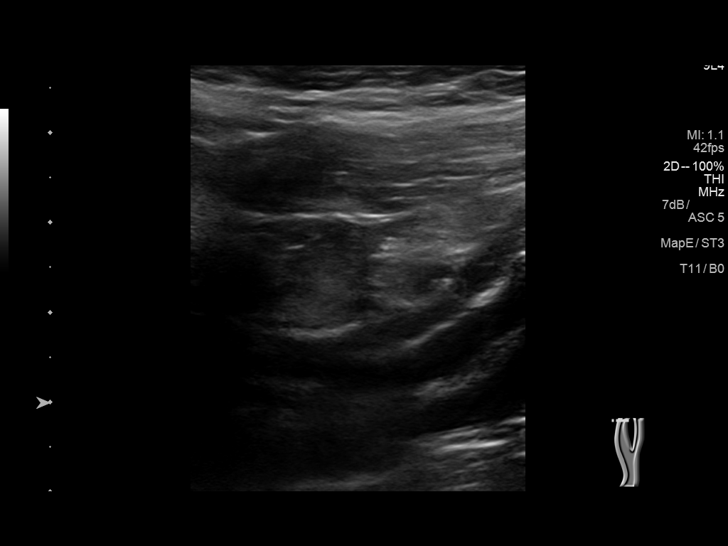
[im 36/69]
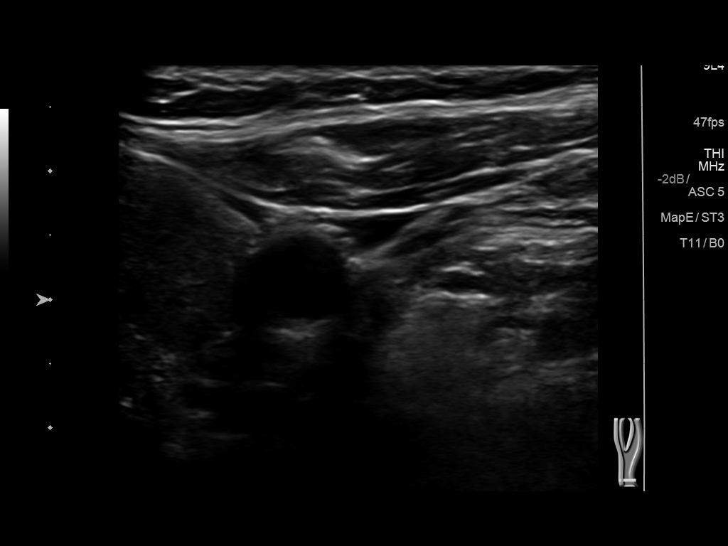
[im 39/69]
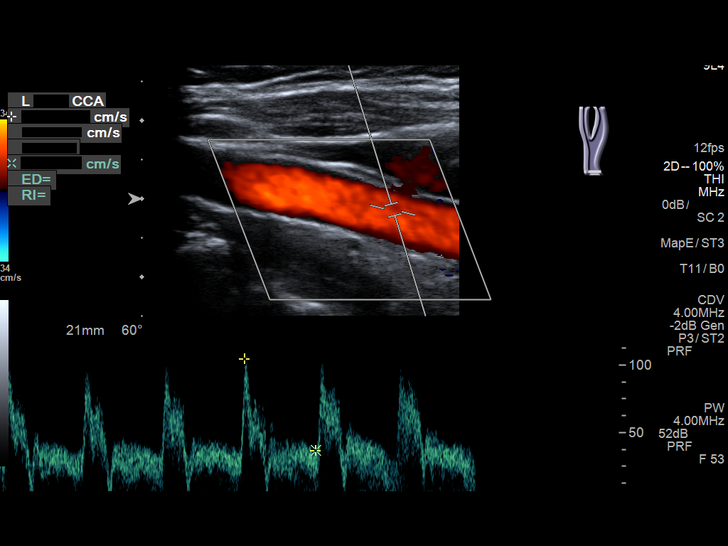
[im 45/69]
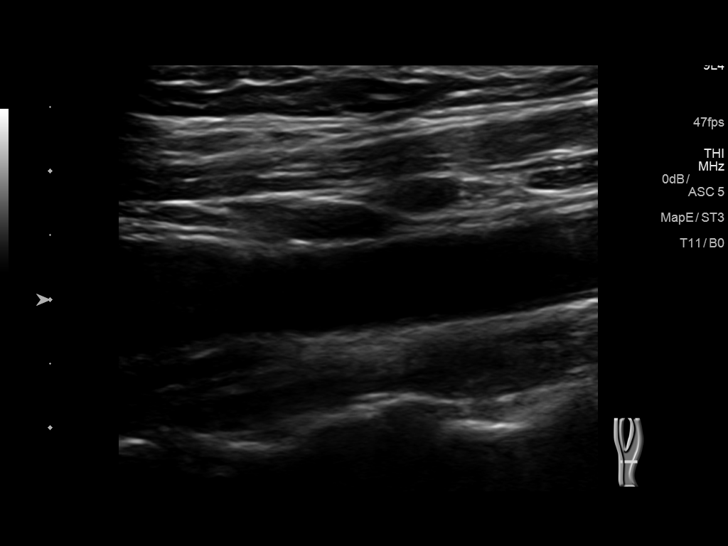
[im 51/69]
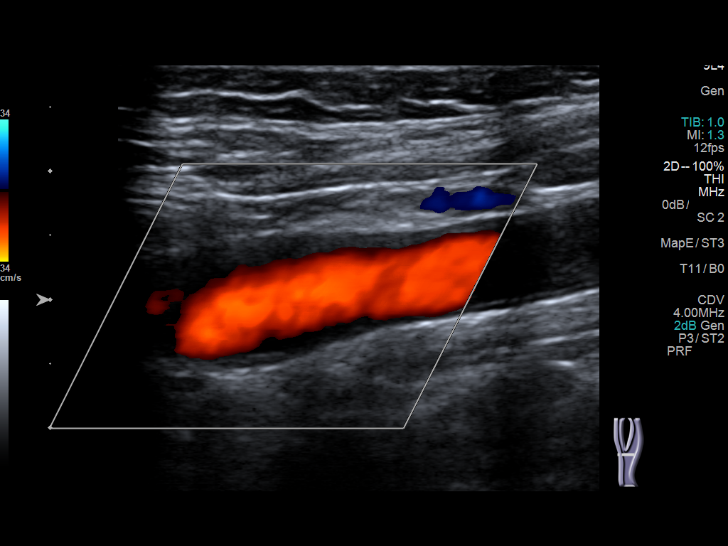
[im 57/69]
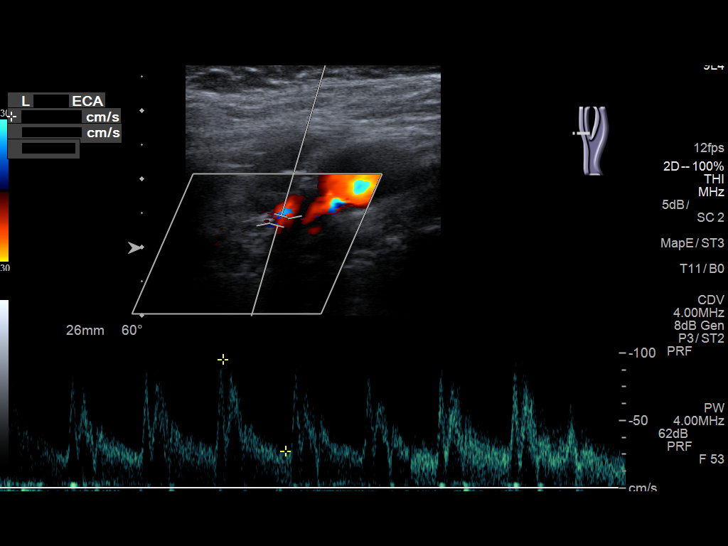
[im 63/69]
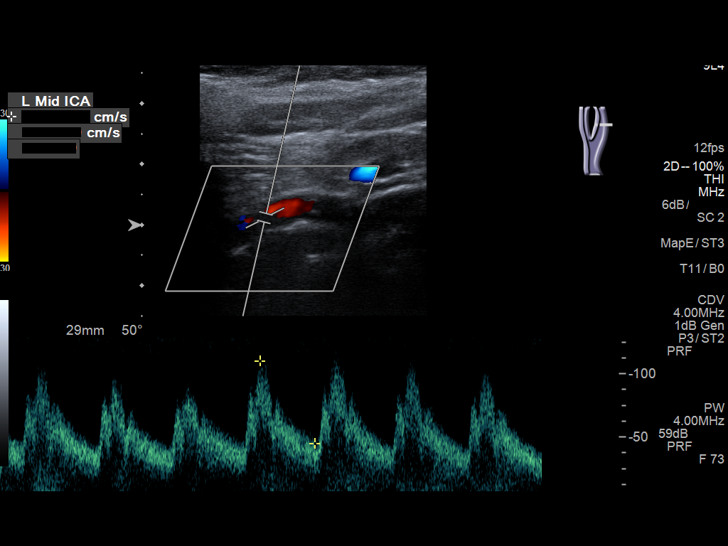
[im 69/69]
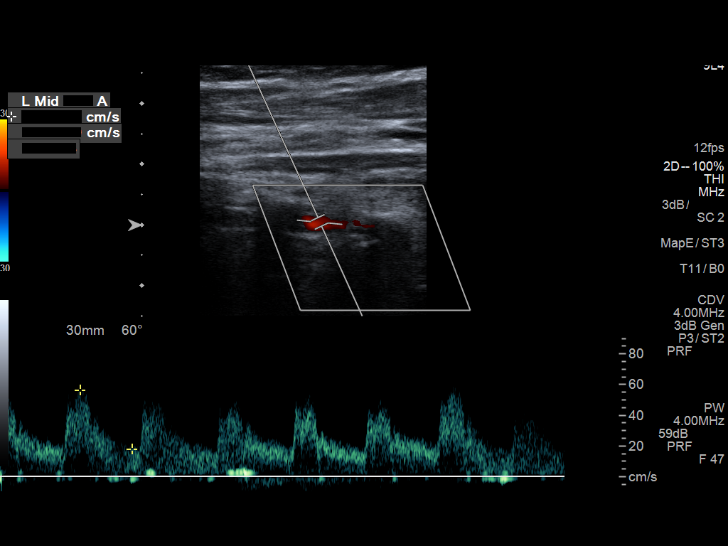

[13 of 24 positions shown; findings below may reference images not displayed]

FINDINGS: Criteria: Quantification of carotid stenosis is based on velocity
parameters that correlate the residual internal carotid diameter
with NASCET-based stenosis levels, using the diameter of the distal
internal carotid lumen as the denominator for stenosis measurement.

The following velocity measurements were obtained:

RIGHT

ICA:  126/40 cm/sec

CCA:  86/31 cm/sec

SYSTOLIC ICA/CCA RATIO:

DIASTOLIC ICA/CCA RATIO:

ECA:  116 cm/sec

LEFT

ICA:  112/48 cm/sec

CCA:  109/34 cm/sec

SYSTOLIC ICA/CCA RATIO:

DIASTOLIC ICA/CCA RATIO:

ECA:  95 cm/sec

RIGHT CAROTID ARTERY: Trace atherosclerotic plaque in the proximal
internal carotid artery. There is no elevation of the peak systolic
velocity in this region. More distally, in a region of tortuosity
there is mild elevation of the peak systolic velocity which is
likely spurious.

RIGHT VERTEBRAL ARTERY:  Patent with antegrade flow.

LEFT CAROTID ARTERY: Mild atherosclerotic plaque in the proximal
internal carotid artery. By peak systolic velocity criteria, the
estimated stenosis is less than 50%.

LEFT VERTEBRAL ARTERY:  Patent with antegrade flow.
IMPRESSION: 1. Mild (1-49%) stenosis proximal right internal carotid artery
secondary to trace smooth heterogeneous atherosclerotic plaque.
2. Mild (1-49%) stenosis proximal left internal carotid artery
secondary to trace smooth heterogeneous atherosclerotic plaque.
3. The vertebral arteries are patent with normal antegrade flow.

## 2019-06-23 ENCOUNTER — Emergency Department (HOSPITAL_COMMUNITY)
Admission: EM | Admit: 2019-06-23 | Discharge: 2019-06-24 | Disposition: A | Discharge status: Home / Self Care | Payer: Self-pay | Attending: Emergency Medicine | Admitting: Emergency Medicine

## 2019-06-23 ENCOUNTER — Other Ambulatory Visit: Payer: Self-pay

## 2019-06-23 ENCOUNTER — Encounter (HOSPITAL_COMMUNITY): Payer: Self-pay | Admitting: *Deleted

## 2019-06-23 DIAGNOSIS — R110 Nausea: Secondary | ICD-10-CM | POA: Insufficient documentation

## 2019-06-23 DIAGNOSIS — N3000 Acute cystitis without hematuria: Secondary | ICD-10-CM | POA: Insufficient documentation

## 2019-06-23 LAB — COMPREHENSIVE METABOLIC PANEL
ALT: 17 U/L (ref 0–44)
AST: 18 U/L (ref 15–41)
Albumin: 4.9 g/dL (ref 3.5–5.0)
Alkaline Phosphatase: 114 U/L (ref 38–126)
Anion gap: 13 (ref 5–15)
BUN: 17 mg/dL (ref 6–20)
CO2: 21 mmol/L — ABNORMAL LOW (ref 22–32)
Calcium: 9.9 mg/dL (ref 8.9–10.3)
Chloride: 103 mmol/L (ref 98–111)
Creatinine, Ser: 0.93 mg/dL (ref 0.44–1.00)
GFR calc Af Amer: >60 mL/min (ref >60)
GFR calc non Af Amer: >60 mL/min (ref >60)
Glucose, Bld: 113 mg/dL — ABNORMAL HIGH (ref 70–99)
Potassium: 3.8 mmol/L (ref 3.5–5.1)
Sodium: 137 mmol/L (ref 135–145)
Total Bilirubin: 0.9 mg/dL (ref 0.3–1.2)
Total Protein: 8.3 g/dL — ABNORMAL HIGH (ref 6.5–8.1)

## 2019-06-23 LAB — URINALYSIS, ROUTINE W REFLEX MICROSCOPIC
Bilirubin Urine: NEGATIVE
Glucose, UA: NEGATIVE mg/dL
Hgb urine dipstick: NEGATIVE
Ketones, ur: NEGATIVE mg/dL
Nitrite: NEGATIVE
Protein, ur: 30 mg/dL — AB
Specific Gravity, Urine: 1.027 (ref 1.005–1.030)
pH: 5 (ref 5.0–8.0)

## 2019-06-23 LAB — CBC
HCT: 51.4 % — ABNORMAL HIGH (ref 36.0–46.0)
Hemoglobin: 16.8 g/dL — ABNORMAL HIGH (ref 12.0–15.0)
MCH: 31.4 pg (ref 26.0–34.0)
MCHC: 32.7 g/dL (ref 30.0–36.0)
MCV: 96.1 fL (ref 80.0–100.0)
Platelets: 315 10*3/uL (ref 150–400)
RBC: 5.35 MIL/uL — ABNORMAL HIGH (ref 3.87–5.11)
RDW: 14.3 % (ref 11.5–15.5)
WBC: 9.3 10*3/uL (ref 4.0–10.5)
nRBC: 0 % (ref 0.0–0.2)

## 2019-06-23 LAB — LIPASE, BLOOD: Lipase: 18 U/L (ref 11–51)

## 2019-06-23 MED ORDER — PHENAZOPYRIDINE HCL 100 MG PO TABS
200.0000 mg | ORAL_TABLET | Freq: Once | ORAL | Status: AC
  Administered 2019-06-23: 200 mg via ORAL
  Filled 2019-06-23: qty 2

## 2019-06-23 MED ORDER — ONDANSETRON 4 MG PO TBDP
4.0000 mg | ORAL_TABLET | Freq: Once | ORAL | Status: AC | PRN
  Administered 2019-06-23: 18:00:00 4 mg via ORAL
  Filled 2019-06-23: qty 1

## 2019-06-23 MED ORDER — CEFTRIAXONE SODIUM 1 G IJ SOLR
1.0000 g | Freq: Once | INTRAMUSCULAR | Status: AC
  Administered 2019-06-24: 1 g via INTRAMUSCULAR
  Filled 2019-06-23: qty 10

## 2019-06-23 MED ORDER — SODIUM CHLORIDE 0.9 % IV BOLUS
1000.0000 mL | Freq: Once | INTRAVENOUS | Status: AC
  Administered 2019-06-23: 1000 mL via INTRAVENOUS

## 2019-06-23 NOTE — ED Provider Notes (Signed)
[facility] EMERGENCY DEPARTMENT Provider Note   CSN: [id number] Arrival date & time: 06/23/19  1701     History   Chief Complaint Chief Complaint  Patient presents with  . Abdominal Pain    HPI Dominique Harrington is a 48 y.o. female.     Patient complains of lower abdominal pain and dysuria  The history is provided by the patient. No language interpreter was used.  Abdominal Pain Pain location:  Periumbilical Pain quality: aching   Pain radiates to:  Does not radiate Pain severity:  Mild Onset quality:  Sudden Timing:  Constant Progression:  Worsening Context: not alcohol use   Associated symptoms: no chest pain, no cough, no diarrhea, no fatigue and no hematuria     Past Medical History:  Diagnosis Date  . Anxiety   . Dyspnea on exertion 2013   Echo, EF =>55%  . Hypertension   . Migraines   . Stroke ([place])   . Tachycardia   . TIA (transient ischemic attack)     Patient Active Problem List   Diagnosis Date Noted  . Sensory disturbance 09/14/2017  . Left hemiparesis ([place]) 09/14/2017  . Dysarthria   . TIA (transient ischemic attack) 09/12/2017  . Obesity, unspecified 11/11/2015  . Essential hypertension, benign 08/12/2015  . Hyperlipidemia 08/12/2015  . Cigarette nicotine dependence, uncomplicated 40/98/1191  . Headache, migraine 08/12/2015    Past Surgical History:  Procedure Laterality Date  . BACK SURGERY    . COSMETIC SURGERY     from dog attack  . HERNIA REPAIR    . TUBAL LIGATION  2004     OB History    Gravida  2   Para  2   Term  2   Preterm      AB      Living  2     SAB      TAB      Ectopic      Multiple      Live Births               Home Medications    Prior to Admission medications   Medication Sig Start Date End Date Taking? Authorizing Provider  aspirin EC 81 MG tablet Take 81 mg by mouth daily.   Yes [provider]  atorvastatin (LIPITOR) 40 MG tablet Take 1 tablet (40 mg total) by mouth  daily. 10/04/18  Yes Soyla Dryer, PA-C  butalbital-acetaminophen-caffeine (FIORICET, ESGIC) [phone] MG tablet Take 1-2 tablets by mouth every 6 (six) hours as needed for headache. (maximum 6 tabs/24 hour) 01/09/19  Yes Soyla Dryer, PA-C  fenofibrate (TRICOR) 145 MG tablet Take 1 tablet (145 mg total) by mouth daily. 06/09/18  Yes Soyla Dryer, PA-C  lisinopril-hydrochlorothiazide (ZESTORETIC) 20-12.5 MG tablet Take 1 tablet by mouth daily. 09/23/17  Yes Soyla Dryer, PA-C  Omega-3 Fatty Acids (FISH OIL) 1000 MG CAPS Take 2,000 mg by mouth 2 (two) times daily.   Yes [provider]  promethazine (PHENERGAN) 25 MG tablet TAKE 1 TABLET BY MOUTH EVERY 6 HOURS AS NEEDED FOR NAUSEA AND VOMITING 01/09/19  Yes Soyla Dryer, PA-C  Acetaminophen-Caffeine (EXCEDRIN TENSION HEADACHE PO) Take by mouth.    [provider]  doxycycline (VIBRA-TABS) 100 MG tablet Take 1 tablet (100 mg total) by mouth 2 (two) times daily. 02/16/19   Soyla Dryer, PA-C    Family History Family History  Problem Relation Age of Onset  . Heart disease Father   . Breast cancer Maternal  Grandmother [id number]    Social History Social History   Tobacco Use  . Smoking status: Current Every Day Smoker    Packs/day: 1.00    Years: 26.00    Pack years: 26.00    Types: Cigarettes    Last attempt to quit: 12/26/2018    Years since quitting: 0.4  . Smokeless tobacco: Never Used  Substance Use Topics  . Alcohol use: No  . Drug use: No     Allergies   Sulfa antibiotics   Review of Systems Review of Systems  Constitutional: Negative for appetite change and fatigue.  HENT: Negative for congestion, ear discharge and sinus pressure.   Eyes: Negative for discharge.  Respiratory: Negative for cough.   Cardiovascular: Negative for chest pain.  Gastrointestinal: Positive for abdominal pain. Negative for diarrhea.  Genitourinary: Negative for frequency and hematuria.  Musculoskeletal: Negative  for back pain.  Skin: Negative for rash.  Neurological: Negative for seizures and headaches.  Psychiatric/Behavioral: Negative for hallucinations.     Physical Exam Updated Vital Signs BP (!) 156/108 (BP Location: Right Arm)   Pulse (!) 119   Temp 98.1 F (36.7 C) (Oral)   Resp 18   Ht 5\' 7"  (1.702 m)   Wt 111.1 kg   LMP 12/19/2014   SpO2 97%   BMI 38.37 kg/m   Physical Exam Vitals signs and nursing note reviewed.  Constitutional:      Appearance: She is well-developed.  HENT:     Head: Normocephalic.     Nose: Nose normal.  Eyes:     General: No scleral icterus.    Conjunctiva/sclera: Conjunctivae normal.  Neck:     Musculoskeletal: Neck supple.     Thyroid: No thyromegaly.  Cardiovascular:     Rate and Rhythm: Normal rate and regular rhythm.     Heart sounds: No murmur. No friction rub. No gallop.   Pulmonary:     Breath sounds: No stridor. No wheezing or rales.  Chest:     Chest wall: No tenderness.  Abdominal:     General: There is no distension.     Tenderness: There is no abdominal tenderness. There is no rebound.     Comments: Tender suprapubic  Musculoskeletal: Normal range of motion.  Lymphadenopathy:     Cervical: No cervical adenopathy.  Skin:    Findings: No erythema or rash.  Neurological:     Mental Status: She is alert and oriented to person, place, and time.     Motor: No abnormal muscle tone.     Coordination: Coordination normal.  Psychiatric:        Behavior: Behavior normal.      ED Treatments / Results  Labs (all labs ordered are listed, but only abnormal results are displayed) Labs Reviewed  COMPREHENSIVE METABOLIC PANEL - Abnormal; Notable for the following components:      Result Value   CO2 21 (*)    Glucose, Bld 113 (*)    Total Protein 8.3 (*)    All other components within normal limits  CBC - Abnormal; Notable for the following components:   RBC 5.35 (*)    Hemoglobin 16.8 (*)    HCT 51.4 (*)    All other  components within normal limits  URINE CULTURE  LIPASE, BLOOD  URINALYSIS, ROUTINE W REFLEX MICROSCOPIC    EKG None  Radiology No results found.  Procedures Procedures (including critical care time)  Medications Ordered in ED Medications  ondansetron (ZOFRAN-ODT) disintegrating tablet 4 mg (  4 mg Oral Given 06/23/19 1825)  phenazopyridine (PYRIDIUM) tablet 200 mg (200 mg Oral Given 06/23/19 2201)  sodium chloride 0.9 % bolus 1,000 mL (1,000 mLs Intravenous New Bag/Given 06/23/19 2200)     Initial Impression / Assessment and Plan / ED Course  I have reviewed the triage vital signs and the nursing notes.  Pertinent labs & imaging results that were available during my care of the patient were reviewed by me and considered in my medical decision making (see chart for details).   Lab work shows most likely urinary tract infection.  She will be placed on Keflex and follow-up with an outpatient      Final Clinical Impressions(s) / ED Diagnoses   Final diagnoses:  None    ED Discharge Orders    None       Bethann Berkshire, MD 06/28/19 1040

## 2019-06-23 NOTE — ED Notes (Signed)
ED Provider at bedside. 

## 2019-06-23 NOTE — ED Triage Notes (Addendum)
Pt c/o lower abdominal pain, n/v, temp of 101, difficulty urinating x 3 days. Pt reports she last voided at 0600 this morning. Pt reports she feels like she needs to urinate but can't when she tries. Ibuprofen 800mg  last taken at 1500 today.   Bladder scan performed in triage, 49cc of urine present in bladder.

## 2019-06-24 MED ORDER — ONDANSETRON 4 MG PO TBDP
4.0000 mg | ORAL_TABLET | Freq: Once | ORAL | Status: AC
  Administered 2019-06-24: 4 mg via ORAL
  Filled 2019-06-24: qty 1

## 2019-06-24 MED ORDER — STERILE WATER FOR INJECTION IJ SOLN
INTRAMUSCULAR | Status: AC
  Administered 2019-06-24: 2.1 mL
  Filled 2019-06-24: qty 10

## 2019-06-24 MED ORDER — CEPHALEXIN 500 MG PO CAPS: 500.0000 mg | ORAL_CAPSULE | Freq: Three times a day (TID) | ORAL | 0 refills | Status: DC

## 2019-06-24 NOTE — ED Provider Notes (Signed)
Patient signed out pending urinalysis.  Reported lower abdominal pain and suprapubic pain with urinary symptoms.  Urinalysis with small leukocyte esterase, 11-20 red cells, 21-50 white cells and few bacteria.  Urine culture was sent.  Patient was given Rocephin.  On recheck, she reports that she just has some nausea.  She was given Zofran.  Will treat for urinary tract infection.  Other lab work reviewed and largely reassuring.  After history, exam, and medical workup I feel the patient has been appropriately medically screened and is safe for discharge home. Pertinent diagnoses were discussed with the patient. Patient was given return precautions.   Physical Exam  BP (!) 134/109   Pulse 96   Temp 98.1 F (36.7 C) (Oral)   Resp 16   Ht 1.702 m (5\' 7" )   Wt 111.1 kg   LMP 12/19/2014   SpO2 99%   BMI 38.37 kg/m   Problem List Items Addressed This Visit    None    Visit Diagnoses    Acute cystitis without hematuria    -  Primary          Merryl Hacker, MD 06/24/19 0004

## 2019-06-24 NOTE — Discharge Instructions (Addendum)
You were seen today for abdominal pain.  Your urinalysis does appear like he might have a urinary tract infection.  You will be started on antibiotics.  Make sure to stay hydrated.  If you develop fevers or back pain, you need to be reassessed.

## 2019-06-25 LAB — URINE CULTURE

## 2019-10-05 ENCOUNTER — Other Ambulatory Visit: Payer: Self-pay

## 2019-10-05 ENCOUNTER — Emergency Department (HOSPITAL_COMMUNITY): Payer: Self-pay

## 2019-10-05 ENCOUNTER — Emergency Department (HOSPITAL_COMMUNITY)
Admission: EM | Admit: 2019-10-05 | Discharge: 2019-10-05 | Disposition: A | Discharge status: Home / Self Care | Payer: Self-pay | Attending: Emergency Medicine | Admitting: Emergency Medicine

## 2019-10-05 DIAGNOSIS — Z8673 Personal history of transient ischemic attack (TIA), and cerebral infarction without residual deficits: Secondary | ICD-10-CM | POA: Insufficient documentation

## 2019-10-05 DIAGNOSIS — I10 Essential (primary) hypertension: Secondary | ICD-10-CM | POA: Insufficient documentation

## 2019-10-05 DIAGNOSIS — F17210 Nicotine dependence, cigarettes, uncomplicated: Secondary | ICD-10-CM | POA: Insufficient documentation

## 2019-10-05 DIAGNOSIS — Z79899 Other long term (current) drug therapy: Secondary | ICD-10-CM | POA: Insufficient documentation

## 2019-10-05 DIAGNOSIS — Z7982 Long term (current) use of aspirin: Secondary | ICD-10-CM | POA: Insufficient documentation

## 2019-10-05 DIAGNOSIS — G43009 Migraine without aura, not intractable, without status migrainosus: Secondary | ICD-10-CM | POA: Insufficient documentation

## 2019-10-05 LAB — DIFFERENTIAL
Abs Immature Granulocytes: 0.02 10*3/uL (ref 0.00–0.07)
Basophils Absolute: 0 10*3/uL (ref 0.0–0.1)
Basophils Relative: 1 %
Eosinophils Absolute: 0 10*3/uL (ref 0.0–0.5)
Eosinophils Relative: 0 %
Immature Granulocytes: 0 %
Lymphocytes Relative: 31 %
Lymphs Abs: 2.3 10*3/uL (ref 0.7–4.0)
Monocytes Absolute: 0.5 10*3/uL (ref 0.1–1.0)
Monocytes Relative: 7 %
Neutro Abs: 4.8 10*3/uL (ref 1.7–7.7)
Neutrophils Relative %: 61 %

## 2019-10-05 LAB — CBC
HCT: 45.4 % (ref 36.0–46.0)
Hemoglobin: 14.3 g/dL (ref 12.0–15.0)
MCH: 30.8 pg (ref 26.0–34.0)
MCHC: 31.5 g/dL (ref 30.0–36.0)
MCV: 97.8 fL (ref 80.0–100.0)
Platelets: 209 10*3/uL (ref 150–400)
RBC: 4.64 MIL/uL (ref 3.87–5.11)
RDW: 13.8 % (ref 11.5–15.5)
WBC: 7.7 10*3/uL (ref 4.0–10.5)
nRBC: 0 % (ref 0.0–0.2)

## 2019-10-05 LAB — COMPREHENSIVE METABOLIC PANEL
ALT: 81 U/L — ABNORMAL HIGH (ref 0–44)
AST: 36 U/L (ref 15–41)
Albumin: 3.8 g/dL (ref 3.5–5.0)
Alkaline Phosphatase: 103 U/L (ref 38–126)
Anion gap: 9 (ref 5–15)
BUN: 13 mg/dL (ref 6–20)
CO2: 25 mmol/L (ref 22–32)
Calcium: 9.6 mg/dL (ref 8.9–10.3)
Chloride: 105 mmol/L (ref 98–111)
Creatinine, Ser: 0.94 mg/dL (ref 0.44–1.00)
GFR calc Af Amer: >60 mL/min (ref >60)
GFR calc non Af Amer: >60 mL/min (ref >60)
Glucose, Bld: 103 mg/dL — ABNORMAL HIGH (ref 70–99)
Potassium: 4.2 mmol/L (ref 3.5–5.1)
Sodium: 139 mmol/L (ref 135–145)
Total Bilirubin: 0.6 mg/dL (ref 0.3–1.2)
Total Protein: 6.6 g/dL (ref 6.5–8.1)

## 2019-10-05 LAB — PROTIME-INR
INR: 0.8 (ref 0.8–1.2)
Prothrombin Time: 11.3 seconds — ABNORMAL LOW (ref 11.4–15.2)

## 2019-10-05 LAB — CBG MONITORING, ED: Glucose-Capillary: 96 mg/dL (ref 70–99)

## 2019-10-05 LAB — APTT: aPTT: 28 seconds (ref 24–36)

## 2019-10-05 MED ORDER — DEXAMETHASONE SODIUM PHOSPHATE 10 MG/ML IJ SOLN
10.0000 mg | Freq: Once | INTRAMUSCULAR | Status: AC
  Administered 2019-10-05: 10 mg via INTRAVENOUS
  Filled 2019-10-05: qty 1

## 2019-10-05 MED ORDER — DIPHENHYDRAMINE HCL 50 MG/ML IJ SOLN
25.0000 mg | Freq: Once | INTRAMUSCULAR | Status: AC
  Administered 2019-10-05: 25 mg via INTRAVENOUS
  Filled 2019-10-05: qty 1

## 2019-10-05 MED ORDER — METOCLOPRAMIDE HCL 5 MG/ML IJ SOLN
10.0000 mg | Freq: Once | INTRAMUSCULAR | Status: AC
  Administered 2019-10-05: 10 mg via INTRAVENOUS
  Filled 2019-10-05: qty 2

## 2019-10-05 NOTE — Discharge Instructions (Addendum)
Follow-up with your primary care provider or you may contact the neurologist listed regarding your headaches.  Return to the ER for any worsening symptoms.

## 2019-10-05 NOTE — ED Notes (Signed)
Patient walked out to nursing station desk without assistance asking for her discharge paperwork. Patient reminded she needs to wait in room. PA Triplett made aware.

## 2019-10-05 NOTE — ED Triage Notes (Signed)
Pt reports history of "mini stroke" and migraine. Pt reports intermittent slurred speech and dizziness since 11pm last night. Pt reports headache since 1 am and reports light and sound sensitivity. Pt denies any weakness, numbness, vision changes at this time. Pt reports usually does not require cane for ambulation assistance but has had to use it since last night.

## 2019-10-05 NOTE — ED Notes (Signed)
Patient transported to CT 

## 2019-10-05 NOTE — ED Provider Notes (Signed)
[facility] EMERGENCY DEPARTMENT Provider Note   CSN: [id number] Arrival date & time: 10/05/19  1333     History Chief Complaint  Patient presents with  . Headache    Dominique Harrington is a 49 y.o. female.  HPI      Dominique Harrington is a 49 y.o. female who presents to the Emergency Department complaining of gradual onset of frontal headache that began at 4 PM yesterday afternoon.  She describes the headache as a dull, pounding sensation to her forehead area that radiates to the top and back of her head.  Headache has been associated with sensitivity to light and sound, nausea vomiting, blurred vision and difficulty with speech.  She states that the headache began as similar to her previous migraine headaches, but worse in intensity.  She states that she usually sees her PCP or comes to ER for Benadryl infusions when the headaches began, but this time she states that she waited too long and the headache became severe.  She reports multiple episodes of vomiting.  The headache has been constant since onset and was so severe last evening that she was having difficulty standing and walking, but these symptoms have since improved. She denies fever, neck pain or stiffness, numbness or weakness of her face or extremities.  She also denies chest or abdominal pain and shortness of breath. She has taken Imitrex and Relpax in the past, but currently she cannot afford those medications.      Past Medical History:  Diagnosis Date  . Anxiety   . Dyspnea on exertion 2013   Echo, EF =>55%  . Hypertension   . Migraines   . Stroke (HCC)   . Tachycardia   . TIA (transient ischemic attack)     Patient Active Problem List   Diagnosis Date Noted  . Sensory disturbance 09/14/2017  . Left hemiparesis (HCC) 09/14/2017  . Dysarthria   . TIA (transient ischemic attack) 09/12/2017  . Obesity, unspecified 11/11/2015  . Essential hypertension, benign 08/12/2015  . Hyperlipidemia 08/12/2015  . Cigarette  nicotine dependence, uncomplicated 08/12/2015  . Headache, migraine 08/12/2015    Past Surgical History:  Procedure Laterality Date  . BACK SURGERY    . COSMETIC SURGERY     from dog attack  . HERNIA REPAIR    . TUBAL LIGATION  2004     OB History    Gravida  2   Para  2   Term  2   Preterm      AB      Living  2     SAB      TAB      Ectopic      Multiple      Live Births              Family History  Problem Relation Age of Onset  . Heart disease Father   . Breast cancer Maternal Grandmother 23    Social History   Tobacco Use  . Smoking status: Current Every Day Smoker    Packs/day: 1.00    Years: 26.00    Pack years: 26.00    Types: Cigarettes    Last attempt to quit: 12/26/2018    Years since quitting: 0.7  . Smokeless tobacco: Never Used  Substance Use Topics  . Alcohol use: No  . Drug use: No    Home Medications Prior to Admission medications   Medication Sig Start Date End Date Taking? Authorizing Provider  Acetaminophen-Caffeine (  EXCEDRIN TENSION HEADACHE PO) Take by mouth.    [provider]  aspirin EC 81 MG tablet Take 81 mg by mouth daily.    [provider]  atorvastatin (LIPITOR) 40 MG tablet Take 1 tablet (40 mg total) by mouth daily. 10/04/18   Jacquelin Hawking, PA-C  butalbital-acetaminophen-caffeine (FIORICET, ESGIC) [phone] MG tablet Take 1-2 tablets by mouth every 6 (six) hours as needed for headache. (maximum 6 tabs/24 hour) 01/09/19   Jacquelin Hawking, PA-C  cephALEXin (KEFLEX) 500 MG capsule Take 1 capsule (500 mg total) by mouth 3 (three) times daily. 06/24/19   Horton, Mayer Masker, MD  doxycycline (VIBRA-TABS) 100 MG tablet Take 1 tablet (100 mg total) by mouth 2 (two) times daily. 02/16/19   Jacquelin Hawking, PA-C  fenofibrate (TRICOR) 145 MG tablet Take 1 tablet (145 mg total) by mouth daily. 06/09/18   Jacquelin Hawking, PA-C  lisinopril-hydrochlorothiazide (ZESTORETIC) 20-12.5 MG tablet Take 1 tablet by  mouth daily. 09/23/17   Jacquelin Hawking, PA-C  Omega-3 Fatty Acids (FISH OIL) 1000 MG CAPS Take 2,000 mg by mouth 2 (two) times daily.    [provider]  promethazine (PHENERGAN) 25 MG tablet TAKE 1 TABLET BY MOUTH EVERY 6 HOURS AS NEEDED FOR NAUSEA AND VOMITING 01/09/19   Jacquelin Hawking, PA-C    Allergies    Sulfa antibiotics  Review of Systems   Review of Systems  Constitutional: Negative for activity change, appetite change and fever.  HENT: Negative for facial swelling and trouble swallowing.   Eyes: Positive for photophobia and visual disturbance. Negative for pain.  Respiratory: Negative for cough, chest tightness and shortness of breath.   Cardiovascular: Negative for chest pain.  Gastrointestinal: Positive for nausea and vomiting. Negative for abdominal distention and abdominal pain.  Musculoskeletal: Negative for arthralgias, neck pain and neck stiffness.  Skin: Negative for rash and wound.  Neurological: Positive for speech difficulty and headaches. Negative for dizziness, syncope, facial asymmetry, weakness and numbness.  Psychiatric/Behavioral: Negative for confusion and decreased concentration.    Physical Exam Updated Vital Signs BP 123/90 (BP Location: Right Arm)   Pulse (!) 106   Temp 98.7 F (37.1 C) (Oral)   Ht 5\' 7"  (1.702 m)   Wt 111.1 kg   LMP 12/19/2014   SpO2 94%   BMI 38.37 kg/m   Physical Exam Vitals and nursing note reviewed.  Constitutional:      General: She is not in acute distress.    Appearance: She is well-developed. She is not ill-appearing or toxic-appearing.  HENT:     Head: Normocephalic and atraumatic.     Mouth/Throat:     Mouth: Mucous membranes are moist.  Eyes:     Extraocular Movements: Extraocular movements intact.     Right eye: Normal extraocular motion.     Left eye: Normal extraocular motion.     Conjunctiva/sclera: Conjunctivae normal.     Pupils: Pupils are equal, round, and reactive to light.  Neck:      Trachea: Phonation normal.     Meningeal: Kernig's sign absent.  Cardiovascular:     Rate and Rhythm: Normal rate and regular rhythm.     Pulses: Normal pulses.  Pulmonary:     Effort: Pulmonary effort is normal. No respiratory distress.     Breath sounds: Normal breath sounds.  Abdominal:     General: There is no distension.     Palpations: Abdomen is soft.     Tenderness: There is no abdominal tenderness.  Musculoskeletal:  General: Normal range of motion.     Cervical back: Normal range of motion and neck supple. No rigidity. No spinous process tenderness or muscular tenderness.     Right lower leg: No edema.     Left lower leg: No edema.  Skin:    General: Skin is warm.     Capillary Refill: Capillary refill takes less than 2 seconds.     Findings: No rash.  Neurological:     General: No focal deficit present.     Mental Status: She is alert and oriented to person, place, and time.     GCS: GCS eye subscore is 4. GCS verbal subscore is 5. GCS motor subscore is 6.     Cranial Nerves: No cranial nerve deficit.     Sensory: Sensation is intact. No sensory deficit.     Motor: Motor function is intact. No weakness or abnormal muscle tone.     Coordination: Coordination is intact.     Gait: Gait normal.     Deep Tendon Reflexes:     Reflex Scores:      Tricep reflexes are 2+ on the right side and 2+ on the left side.      Bicep reflexes are 2+ on the right side and 2+ on the left side.    Comments: CN III-XII intact.  Speech clear, no facial weakness or droop.  nml finger-nose testing. No dysphagia or expressive asphasia.    Psychiatric:        Thought Content: Thought content normal.     ED Results / Procedures / Treatments   Labs (all labs ordered are listed, but only abnormal results are displayed) Labs Reviewed  PROTIME-INR - Abnormal; Notable for the following components:      Result Value   Prothrombin Time 11.3 (*)    All other components within normal limits   COMPREHENSIVE METABOLIC PANEL - Abnormal; Notable for the following components:   Glucose, Bld 103 (*)    ALT 81 (*)    All other components within normal limits  APTT  CBC  DIFFERENTIAL  CBG MONITORING, ED  CBG MONITORING, ED    EKG None  Radiology CT HEAD WO CONTRAST  Result Date: 10/05/2019 CLINICAL DATA:  Acute headache with slurred speech and dizziness 1 day. EXAM: CT HEAD WITHOUT CONTRAST TECHNIQUE: Contiguous axial images were obtained from the base of the skull through the vertex without intravenous contrast. COMPARISON:  02/09/2018 FINDINGS: Brain: Ventricles, cisterns and other CSF spaces are normal. There is no mass, mass effect, shift of midline structures or acute hemorrhage. No evidence of acute infarction. Vascular: No hyperdense vessel or unexpected calcification. Skull: Normal. Negative for fracture or focal lesion. Sinuses/Orbits: No acute finding. Other: None. IMPRESSION: No acute findings. Electronically Signed   By: Marin Olp M.D.   On: 10/05/2019 14:53    Procedures Procedures (including critical care time)  Medications Ordered in ED Medications  diphenhydrAMINE (BENADRYL) injection 25 mg (has no administration in time range)  metoCLOPramide (REGLAN) injection 10 mg (has no administration in time range)  dexamethasone (DECADRON) injection 10 mg (has no administration in time range)    ED Course  I have reviewed the triage vital signs and the nursing notes.  Pertinent labs & imaging results that were available during my care of the patient were reviewed by me and considered in my medical decision making (see chart for details).    MDM Rules/Calculators/A&P  Pt with hx of atypical migraines.  Current symptoms similar to previous headaches.  No focal neurological deficits on exam.  No meningeal signs.     On recheck, patient reports feeling better and headache is now a "4" down from a "10" and she states she is ready for discharge  home.  Patient was also seen by Dr. Adriana Simas and care plan discussed.  I feel that her symptoms are related to a likely atypical migraine headache.  Clinical suspicion for TIA is low.  She has tolerated p.o. fluids without difficulty and she has ambulated in the department and gait is slow but steady. I will also provide referral info to neurology.  Return precautions discussed.    Final Clinical Impression(s) / ED Diagnoses Final diagnoses:  Migraine without aura and without status migrainosus, not intractable    Rx / DC Orders ED Discharge Orders    None       Rosey Bath 10/05/19 2009    Donnetta Hutching, MD 10/06/19 2221

## 2019-12-21 ENCOUNTER — Emergency Department (HOSPITAL_COMMUNITY): Payer: Self-pay

## 2019-12-21 ENCOUNTER — Encounter (HOSPITAL_COMMUNITY): Payer: Self-pay | Admitting: *Deleted

## 2019-12-21 ENCOUNTER — Emergency Department (HOSPITAL_COMMUNITY)
Admission: EM | Admit: 2019-12-21 | Discharge: 2019-12-21 | Disposition: A | Discharge status: Home / Self Care | Payer: Self-pay | Attending: Emergency Medicine | Admitting: Emergency Medicine

## 2019-12-21 ENCOUNTER — Other Ambulatory Visit: Payer: Self-pay

## 2019-12-21 DIAGNOSIS — W208XXA Other cause of strike by thrown, projected or falling object, initial encounter: Secondary | ICD-10-CM | POA: Insufficient documentation

## 2019-12-21 DIAGNOSIS — Z79899 Other long term (current) drug therapy: Secondary | ICD-10-CM | POA: Insufficient documentation

## 2019-12-21 DIAGNOSIS — Y939 Activity, unspecified: Secondary | ICD-10-CM | POA: Insufficient documentation

## 2019-12-21 DIAGNOSIS — I10 Essential (primary) hypertension: Secondary | ICD-10-CM | POA: Insufficient documentation

## 2019-12-21 DIAGNOSIS — S9031XA Contusion of right foot, initial encounter: Secondary | ICD-10-CM | POA: Insufficient documentation

## 2019-12-21 DIAGNOSIS — Y929 Unspecified place or not applicable: Secondary | ICD-10-CM | POA: Insufficient documentation

## 2019-12-21 DIAGNOSIS — Z7982 Long term (current) use of aspirin: Secondary | ICD-10-CM | POA: Insufficient documentation

## 2019-12-21 DIAGNOSIS — F17210 Nicotine dependence, cigarettes, uncomplicated: Secondary | ICD-10-CM | POA: Insufficient documentation

## 2019-12-21 DIAGNOSIS — Y999 Unspecified external cause status: Secondary | ICD-10-CM | POA: Insufficient documentation

## 2019-12-21 DIAGNOSIS — Z8673 Personal history of transient ischemic attack (TIA), and cerebral infarction without residual deficits: Secondary | ICD-10-CM | POA: Insufficient documentation

## 2019-12-21 MED ORDER — ONDANSETRON 4 MG PO TBDP
4.0000 mg | ORAL_TABLET | Freq: Once | ORAL | Status: AC
  Administered 2019-12-21: 4 mg via ORAL
  Filled 2019-12-21: qty 1

## 2019-12-21 NOTE — ED Provider Notes (Signed)
[facility] EMERGENCY DEPARTMENT Provider Note   CSN: [id number] Arrival date & time: 12/21/19  1415     History Chief Complaint  Patient presents with  . Foot Pain    Dominique Harrington is a 49 y.o. female.  The history is provided by the patient. No language interpreter was used.  Foot Pain This is a new problem. The current episode started 1 to 2 hours ago. The problem occurs constantly. The problem has not changed since onset.Nothing aggravates the symptoms. Nothing relieves the symptoms. She has tried nothing for the symptoms. The treatment provided no relief.  Pt reports she dropped a 7 pound of concrete on her foot.  Pt complains of pain.  Pt concerned she could have fractured her foot      Past Medical History:  Diagnosis Date  . Anxiety   . Dyspnea on exertion 2013   Echo, EF =>55%  . Hypertension   . Migraines   . Stroke (HCC)   . Tachycardia   . TIA (transient ischemic attack)     Patient Active Problem List   Diagnosis Date Noted  . Sensory disturbance 09/14/2017  . Left hemiparesis (HCC) 09/14/2017  . Dysarthria   . TIA (transient ischemic attack) 09/12/2017  . Obesity, unspecified 11/11/2015  . Essential hypertension, benign 08/12/2015  . Hyperlipidemia 08/12/2015  . Cigarette nicotine dependence, uncomplicated 08/12/2015  . Headache, migraine 08/12/2015    Past Surgical History:  Procedure Laterality Date  . BACK SURGERY    . COSMETIC SURGERY     from dog attack  . HERNIA REPAIR    . TUBAL LIGATION  2004     OB History    Gravida  2   Para  2   Term  2   Preterm      AB      Living  2     SAB      TAB      Ectopic      Multiple      Live Births              Family History  Problem Relation Age of Onset  . Heart disease Father   . Breast cancer Maternal Grandmother 12    Social History   Tobacco Use  . Smoking status: Current Every Day Smoker    Packs/day: 1.00    Years: 26.00    Pack years: 26.00    Types:  Cigarettes    Last attempt to quit: 12/26/2018    Years since quitting: 0.9  . Smokeless tobacco: Never Used  Substance Use Topics  . Alcohol use: No  . Drug use: No    Home Medications Prior to Admission medications   Medication Sig Start Date End Date Taking? Authorizing Provider  Acetaminophen-Caffeine (EXCEDRIN TENSION HEADACHE PO) Take by mouth.    [provider]  aspirin EC 81 MG tablet Take 81 mg by mouth daily.    [provider]  atorvastatin (LIPITOR) 40 MG tablet Take 1 tablet (40 mg total) by mouth daily. 10/04/18   Jacquelin Hawking, PA-C  butalbital-acetaminophen-caffeine (FIORICET, ESGIC) [phone] MG tablet Take 1-2 tablets by mouth every 6 (six) hours as needed for headache. (maximum 6 tabs/24 hour) 01/09/19   Jacquelin Hawking, PA-C  cephALEXin (KEFLEX) 500 MG capsule Take 1 capsule (500 mg total) by mouth 3 (three) times daily. 06/24/19   Horton, Mayer Masker, MD  doxycycline (VIBRA-TABS) 100 MG tablet Take 1 tablet (100 mg total) by mouth  2 (two) times daily. 02/16/19   Soyla Dryer, PA-C  fenofibrate (TRICOR) 145 MG tablet Take 1 tablet (145 mg total) by mouth daily. 06/09/18   Soyla Dryer, PA-C  lisinopril-hydrochlorothiazide (ZESTORETIC) 20-12.5 MG tablet Take 1 tablet by mouth daily. 09/23/17   Soyla Dryer, PA-C  Omega-3 Fatty Acids (FISH OIL) 1000 MG CAPS Take 2,000 mg by mouth 2 (two) times daily.    [provider]  promethazine (PHENERGAN) 25 MG tablet TAKE 1 TABLET BY MOUTH EVERY 6 HOURS AS NEEDED FOR NAUSEA AND VOMITING 01/09/19   Soyla Dryer, PA-C    Allergies    Sulfa antibiotics  Review of Systems   Review of Systems  All other systems reviewed and are negative.   Physical Exam Updated Vital Signs BP (!) 144/106 (BP Location: Left Arm)   Pulse (!) 102   Temp 97.9 F (36.6 C) (Oral)   Resp 16   Ht 5\' 7"  (1.702 m)   Wt 111.1 kg   LMP 12/19/2014   SpO2 100%   BMI 38.37 kg/m   Physical Exam Vitals and  nursing note reviewed.  Constitutional:      Appearance: She is well-developed.  HENT:     Head: Normocephalic.  Pulmonary:     Effort: Pulmonary effort is normal.  Abdominal:     General: There is no distension.  Musculoskeletal:        General: Normal range of motion.     Cervical back: Normal range of motion.  Neurological:     Mental Status: She is alert and oriented to person, place, and time.     ED Results / Procedures / Treatments   Labs (all labs ordered are listed, but only abnormal results are displayed) Labs Reviewed - No data to display  EKG None  Radiology DG Foot Complete Right  Result Date: 12/21/2019 CLINICAL DATA:  Right foot pain after injury. EXAM: RIGHT FOOT COMPLETE - 3+ VIEW COMPARISON:  None. FINDINGS: There is no evidence of fracture or dislocation. There is no evidence of arthropathy or other focal bone abnormality. Dorsal soft tissue swelling is noted. IMPRESSION: No fracture or dislocation is noted. Dorsal soft tissue swelling is noted. Electronically Signed   By: Marijo Conception M.D.   On: 12/21/2019 14:51    Procedures Procedures (including critical care time)  Medications Ordered in ED Medications  ondansetron (ZOFRAN-ODT) disintegrating tablet 4 mg (has no administration in time range)    ED Course  I have reviewed the triage vital signs and the nursing notes.  Pertinent labs & imaging results that were available during my care of the patient were reviewed by me and considered in my medical decision making (see chart for details).    MDM Rules/Calculators/A&P                      MDM:  Xray no acute abnormality pt placed in an ace wrap and post op shoe Final Clinical Impression(s) / ED Diagnoses Final diagnoses:  Contusion of right foot, initial encounter    Rx / DC Orders ED Discharge Orders    None    An After Visit Summary was printed and given to the patient.    Fransico Meadow, [place] 12/21/19 1526    Elnora Morrison,  MD 12/23/19 [phone]

## 2019-12-21 NOTE — Discharge Instructions (Addendum)
Return if any problems. See your Physician for recheck if pain persist  

## 2019-12-21 NOTE — ED Triage Notes (Signed)
Pt with right foot pain after dropping a heavy ceramic object, unable to put all weight on her right foot.

## 2020-05-27 ENCOUNTER — Emergency Department (HOSPITAL_COMMUNITY)
Admission: EM | Admit: 2020-05-27 | Discharge: 2020-05-27 | Disposition: A | Discharge status: Home / Self Care | Payer: Self-pay | Attending: Emergency Medicine | Admitting: Emergency Medicine

## 2020-05-27 ENCOUNTER — Encounter (HOSPITAL_COMMUNITY): Payer: Self-pay

## 2020-05-27 ENCOUNTER — Other Ambulatory Visit: Payer: Self-pay

## 2020-05-27 ENCOUNTER — Emergency Department (HOSPITAL_COMMUNITY): Payer: Self-pay

## 2020-05-27 DIAGNOSIS — R42 Dizziness and giddiness: Secondary | ICD-10-CM | POA: Insufficient documentation

## 2020-05-27 DIAGNOSIS — G43909 Migraine, unspecified, not intractable, without status migrainosus: Secondary | ICD-10-CM | POA: Insufficient documentation

## 2020-05-27 DIAGNOSIS — Z5321 Procedure and treatment not carried out due to patient leaving prior to being seen by health care provider: Secondary | ICD-10-CM | POA: Insufficient documentation

## 2020-05-27 NOTE — ED Notes (Signed)
Left prior to evaluation 

## 2020-05-27 NOTE — ED Notes (Signed)
EDP made aware of pt and is putting in orders.

## 2020-05-27 NOTE — ED Triage Notes (Signed)
Pt reports history of migraines and says has had migraine x 3days.  Also reports dizziness and slurred speech x 3days.  Reports sensitive to light and sound.  Pt says feels like usual migraine.

## 2020-05-28 ENCOUNTER — Other Ambulatory Visit: Payer: Self-pay

## 2020-05-28 ENCOUNTER — Emergency Department (HOSPITAL_COMMUNITY): Payer: Self-pay

## 2020-05-28 ENCOUNTER — Emergency Department (HOSPITAL_COMMUNITY)
Admission: EM | Admit: 2020-05-28 | Discharge: 2020-05-28 | Disposition: A | Discharge status: Home / Self Care | Payer: Self-pay | Attending: Emergency Medicine | Admitting: Emergency Medicine

## 2020-05-28 ENCOUNTER — Encounter (HOSPITAL_COMMUNITY): Payer: Self-pay | Admitting: *Deleted

## 2020-05-28 DIAGNOSIS — Z7982 Long term (current) use of aspirin: Secondary | ICD-10-CM | POA: Insufficient documentation

## 2020-05-28 DIAGNOSIS — I959 Hypotension, unspecified: Secondary | ICD-10-CM | POA: Insufficient documentation

## 2020-05-28 DIAGNOSIS — R079 Chest pain, unspecified: Secondary | ICD-10-CM

## 2020-05-28 DIAGNOSIS — F17210 Nicotine dependence, cigarettes, uncomplicated: Secondary | ICD-10-CM | POA: Insufficient documentation

## 2020-05-28 DIAGNOSIS — Z20822 Contact with and (suspected) exposure to covid-19: Secondary | ICD-10-CM | POA: Insufficient documentation

## 2020-05-28 DIAGNOSIS — I10 Essential (primary) hypertension: Secondary | ICD-10-CM | POA: Insufficient documentation

## 2020-05-28 LAB — CBC
HCT: 41.9 % (ref 36.0–46.0)
Hemoglobin: 13.5 g/dL (ref 12.0–15.0)
MCH: 31.5 pg (ref 26.0–34.0)
MCHC: 32.2 g/dL (ref 30.0–36.0)
MCV: 97.9 fL (ref 80.0–100.0)
Platelets: 254 10*3/uL (ref 150–400)
RBC: 4.28 MIL/uL (ref 3.87–5.11)
RDW: 15 % (ref 11.5–15.5)
WBC: 10.2 10*3/uL (ref 4.0–10.5)
nRBC: 0 % (ref 0.0–0.2)

## 2020-05-28 LAB — URINALYSIS, ROUTINE W REFLEX MICROSCOPIC
Bilirubin Urine: NEGATIVE
Glucose, UA: NEGATIVE mg/dL
Hgb urine dipstick: NEGATIVE
Ketones, ur: NEGATIVE mg/dL
Leukocytes,Ua: NEGATIVE
Nitrite: NEGATIVE
Protein, ur: NEGATIVE mg/dL
Specific Gravity, Urine: 1.027 (ref 1.005–1.030)
pH: 5 (ref 5.0–8.0)

## 2020-05-28 LAB — CBG MONITORING, ED: Glucose-Capillary: 108 mg/dL — ABNORMAL HIGH (ref 70–99)

## 2020-05-28 LAB — RAPID URINE DRUG SCREEN, HOSP PERFORMED
Amphetamines: NOT DETECTED
Barbiturates: NOT DETECTED
Benzodiazepines: POSITIVE — AB
Cocaine: NOT DETECTED
Opiates: NOT DETECTED
Tetrahydrocannabinol: NOT DETECTED

## 2020-05-28 LAB — BASIC METABOLIC PANEL
Anion gap: 12 (ref 5–15)
BUN: 20 mg/dL (ref 6–20)
CO2: 22 mmol/L (ref 22–32)
Calcium: 9.6 mg/dL (ref 8.9–10.3)
Chloride: 103 mmol/L (ref 98–111)
Creatinine, Ser: 1.31 mg/dL — ABNORMAL HIGH (ref 0.44–1.00)
GFR calc Af Amer: 55 mL/min — ABNORMAL LOW (ref >60)
GFR calc non Af Amer: 47 mL/min — ABNORMAL LOW (ref >60)
Glucose, Bld: 105 mg/dL — ABNORMAL HIGH (ref 70–99)
Potassium: 4.2 mmol/L (ref 3.5–5.1)
Sodium: 137 mmol/L (ref 135–145)

## 2020-05-28 LAB — TROPONIN I (HIGH SENSITIVITY)
Troponin I (High Sensitivity): 5 ng/L (ref <18)
Troponin I (High Sensitivity): 5 ng/L (ref <18)

## 2020-05-28 LAB — ACETAMINOPHEN LEVEL: Acetaminophen (Tylenol), Serum: 18 ug/mL (ref 10–30)

## 2020-05-28 LAB — SARS CORONAVIRUS 2 BY RT PCR (HOSPITAL ORDER, PERFORMED IN ~~LOC~~ HOSPITAL LAB): SARS Coronavirus 2: NEGATIVE

## 2020-05-28 LAB — SALICYLATE LEVEL: Salicylate Lvl: <7 mg/dL — ABNORMAL LOW (ref 7.0–30.0)

## 2020-05-28 LAB — ETHANOL: Alcohol, Ethyl (B): <10 mg/dL (ref <10)

## 2020-05-28 MED ORDER — SODIUM CHLORIDE 0.9 % IV BOLUS
1000.0000 mL | Freq: Once | INTRAVENOUS | Status: AC
  Administered 2020-05-28: 1000 mL via INTRAVENOUS

## 2020-05-28 NOTE — ED Notes (Signed)
Pt's fluid bolus has completed. BP 84/51. Pt still very lethargic and falling asleep during conversation. Langston Masker, PA notified.

## 2020-05-28 NOTE — ED Triage Notes (Addendum)
Pt with mid chest pain the other day.  Pt was here yesterday for migraine but left without being seen.

## 2020-05-28 NOTE — ED Notes (Signed)
Pt with slurred speech for past 3 days as well. Pt states migraine HA's for 3 days.  Pt left without being seen yesterday after triage. Pt drowsy in triage. CN notified.

## 2020-05-28 NOTE — Discharge Instructions (Signed)
Do not take metoprolol tomorrow.  See your MD for recheck in 2-3 days

## 2020-05-28 NOTE — ED Provider Notes (Signed)
[facility] EMERGENCY DEPARTMENT Provider Note   CSN: [id number] Arrival date & time: 05/28/20  1205     History Chief Complaint  Patient presents with  . Chest Pain    Dominique Harrington is a 49 y.o. female.  The history is provided by the patient. No language interpreter was used.  Chest Pain Pain location:  L chest Pain quality: aching   Pain radiates to:  Does not radiate Pain severity:  Moderate Onset quality:  Gradual Duration:  1 day Timing:  Constant Progression:  Worsening Chronicity:  New Relieved by:  Nothing Worsened by:  Nothing Ineffective treatments:  None tried Associated symptoms: no abdominal pain   Risk factors: no coronary artery disease   Pt reports she has been under a lot of stress.  Pt reports she had a headache yesterday and came here but left before being seen.  Pt reports pain in her chest today.  Pt reports taking peach xanax for anxiety.  (Pt's daughter died 4 months ago)       Past Medical History:  Diagnosis Date  . Anxiety   . Dyspnea on exertion 2013   Echo, EF =>55%  . Hypertension   . Migraines   . Stroke (HCC)   . Tachycardia   . TIA (transient ischemic attack)     Patient Active Problem List   Diagnosis Date Noted  . Sensory disturbance 09/14/2017  . Left hemiparesis (HCC) 09/14/2017  . Dysarthria   . TIA (transient ischemic attack) 09/12/2017  . Obesity, unspecified 11/11/2015  . Essential hypertension, benign 08/12/2015  . Hyperlipidemia 08/12/2015  . Cigarette nicotine dependence, uncomplicated 08/12/2015  . Headache, migraine 08/12/2015    Past Surgical History:  Procedure Laterality Date  . BACK SURGERY    . COSMETIC SURGERY     from dog attack  . HERNIA REPAIR    . TUBAL LIGATION  2004     OB History    Gravida  2   Para  2   Term  2   Preterm      AB      Living  2     SAB      TAB      Ectopic      Multiple      Live Births              Family History  Problem Relation Age of  Onset  . Heart disease Father   . Breast cancer Maternal Grandmother 26    Social History   Tobacco Use  . Smoking status: Current Every Day Smoker    Packs/day: 1.00    Years: 26.00    Pack years: 26.00    Types: Cigarettes    Last attempt to quit: 12/26/2018    Years since quitting: 1.4  . Smokeless tobacco: Never Used  Vaping Use  . Vaping Use: Never used  Substance Use Topics  . Alcohol use: No  . Drug use: No    Home Medications Prior to Admission medications   Medication Sig Start Date End Date Taking? Authorizing Provider  aspirin EC 81 MG tablet Take 81 mg by mouth daily.   Yes [provider]  ibuprofen (ADVIL) 200 MG tablet Take 800 mg by mouth every 6 (six) hours as needed.   Yes [provider]    Allergies    Sulfa antibiotics  Review of Systems   Review of Systems  Cardiovascular: Positive for chest pain.  Gastrointestinal: Negative for  abdominal pain.  All other systems reviewed and are negative.   Physical Exam Updated Vital Signs BP (!) 74/51   Pulse (!) 52   Temp 97.7 F (36.5 C) (Oral)   Resp (!) 9   Ht 5\' 7"  (1.702 m)   Wt 109.8 kg   LMP 12/19/2014   SpO2 93%   BMI 37.90 kg/m   Physical Exam Vitals and nursing note reviewed.  Constitutional:      Appearance: She is well-developed.  HENT:     Head: Normocephalic.  Cardiovascular:     Rate and Rhythm: Normal rate and regular rhythm.     Heart sounds: Normal heart sounds.  Pulmonary:     Effort: Pulmonary effort is normal.     Breath sounds: Normal breath sounds.  Chest:     Chest wall: No tenderness.  Abdominal:     General: There is no distension.     Palpations: Abdomen is soft.  Musculoskeletal:        General: Normal range of motion.     Cervical back: Normal range of motion.  Skin:    General: Skin is warm.  Neurological:     General: No focal deficit present.     Mental Status: She is alert and oriented to person, place, and time.  Psychiatric:          Mood and Affect: Mood normal.     ED Results / Procedures / Treatments   Labs (all labs ordered are listed, but only abnormal results are displayed) Labs Reviewed  BASIC METABOLIC PANEL - Abnormal; Notable for the following components:      Result Value   Glucose, Bld 105 (*)    Creatinine, Ser 1.31 (*)    GFR calc non Af Amer 47 (*)    GFR calc Af Amer 55 (*)    All other components within normal limits  URINALYSIS, ROUTINE W REFLEX MICROSCOPIC - Abnormal; Notable for the following components:   APPearance HAZY (*)    All other components within normal limits  RAPID URINE DRUG SCREEN, HOSP PERFORMED - Abnormal; Notable for the following components:   Benzodiazepines POSITIVE (*)    All other components within normal limits  SALICYLATE LEVEL - Abnormal; Notable for the following components:   Salicylate Lvl <7.0 (*)    All other components within normal limits  CBG MONITORING, ED - Abnormal; Notable for the following components:   Glucose-Capillary 108 (*)    All other components within normal limits  SARS CORONAVIRUS 2 BY RT PCR (HOSPITAL ORDER, PERFORMED IN [facility])  CBC  ETHANOL  ACETAMINOPHEN LEVEL  TROPONIN I (HIGH SENSITIVITY)  TROPONIN I (HIGH SENSITIVITY)    EKG None  Radiology CT Head Wo Contrast  Result Date: 05/27/2020 CLINICAL DATA:  Neuro deficit.  Acute stroke suspected. EXAM: CT HEAD WITHOUT CONTRAST TECHNIQUE: Contiguous axial images were obtained from the base of the skull through the vertex without intravenous contrast. COMPARISON:  10/05/2019 FINDINGS: Brain: No evidence of acute infarction, hemorrhage, hydrocephalus, extra-axial collection or mass lesion/mass effect. Vascular: No hyperdense vessel or unexpected calcification. Skull: Normal. Negative for fracture or focal lesion. Sinuses/Orbits: No acute finding. Other: None IMPRESSION: No acute intracranial abnormalities. Normal brain. Electronically Signed   By: 12/03/2019 M.D.    On: 05/27/2020 09:59   DG Chest Port 1 View  Result Date: 05/28/2020 CLINICAL DATA:  Chest pain and slurred speech over the last 3 days. Headache. EXAM: PORTABLE CHEST 1 VIEW COMPARISON:  09/12/2017 FINDINGS: Mild cardiomegaly. Mediastinal shadows are normal. Question mild pulmonary venous hypertension. No frank edema. No infiltrate, collapse or effusion. IMPRESSION: Mild cardiomegaly. Question mild pulmonary venous hypertension. No frank edema. Electronically Signed   By: Paulina Fusi M.D.   On: 05/28/2020 13:32    Procedures Procedures (including critical care time)  Medications Ordered in ED Medications  sodium chloride 0.9 % bolus 1,000 mL (0 mLs Intravenous Stopped 05/28/20 1426)  sodium chloride 0.9 % bolus 1,000 mL (1,000 mLs Intravenous New Bag/Given 05/28/20 1748)    ED Course  I have reviewed the triage vital signs and the nursing notes.  Pertinent labs & imaging results that were available during my care of the patient were reviewed by me and considered in my medical decision making (see chart for details).    MDM Rules/Calculators/A&P                          MDM:  Pt given Iv fluids x 2 liters.  Blood pressure improved from 75/40 to 106/60.  Pt able to stand.  Pt does not want to be admitted.  Pt reports her blood pressure has dropped in the past.  Pt reports no headache today.  Pt reports no chest pain.  Pt thinks she may have been dehyrated.   Final Clinical Impression(s) / ED Diagnoses Final diagnoses:  Hypotension, unspecified hypotension type    Rx / DC Orders ED Discharge Orders    None    An After Visit Summary was printed and given to the patient.    Elson Areas, Cordelia Poche 05/28/20 1944    Bethann Berkshire, MD 05/30/20 [phone]

## 2020-05-29 ENCOUNTER — Encounter (HOSPITAL_COMMUNITY): Payer: Self-pay | Admitting: Emergency Medicine

## 2020-05-29 ENCOUNTER — Other Ambulatory Visit: Payer: Self-pay

## 2020-05-29 ENCOUNTER — Observation Stay (HOSPITAL_COMMUNITY)
Admission: EM | Admit: 2020-05-29 | Discharge: 2020-05-30 | Disposition: A | Discharge status: Home / Self Care | Payer: Self-pay | Attending: Internal Medicine | Admitting: Internal Medicine

## 2020-05-29 ENCOUNTER — Emergency Department (HOSPITAL_COMMUNITY): Payer: Self-pay

## 2020-05-29 ENCOUNTER — Observation Stay (HOSPITAL_BASED_OUTPATIENT_CLINIC_OR_DEPARTMENT_OTHER): Payer: Self-pay

## 2020-05-29 DIAGNOSIS — R001 Bradycardia, unspecified: Secondary | ICD-10-CM | POA: Insufficient documentation

## 2020-05-29 DIAGNOSIS — I959 Hypotension, unspecified: Secondary | ICD-10-CM | POA: Insufficient documentation

## 2020-05-29 DIAGNOSIS — Z7982 Long term (current) use of aspirin: Secondary | ICD-10-CM | POA: Insufficient documentation

## 2020-05-29 DIAGNOSIS — Z20822 Contact with and (suspected) exposure to covid-19: Secondary | ICD-10-CM | POA: Insufficient documentation

## 2020-05-29 DIAGNOSIS — R519 Headache, unspecified: Principal | ICD-10-CM | POA: Insufficient documentation

## 2020-05-29 DIAGNOSIS — I10 Essential (primary) hypertension: Secondary | ICD-10-CM | POA: Insufficient documentation

## 2020-05-29 DIAGNOSIS — Z8673 Personal history of transient ischemic attack (TIA), and cerebral infarction without residual deficits: Secondary | ICD-10-CM | POA: Insufficient documentation

## 2020-05-29 DIAGNOSIS — G43909 Migraine, unspecified, not intractable, without status migrainosus: Secondary | ICD-10-CM | POA: Diagnosis present

## 2020-05-29 DIAGNOSIS — I9589 Other hypotension: Secondary | ICD-10-CM

## 2020-05-29 DIAGNOSIS — F17210 Nicotine dependence, cigarettes, uncomplicated: Secondary | ICD-10-CM | POA: Diagnosis present

## 2020-05-29 DIAGNOSIS — R531 Weakness: Secondary | ICD-10-CM

## 2020-05-29 LAB — HEPATIC FUNCTION PANEL
ALT: 20 U/L (ref 0–44)
AST: 18 U/L (ref 15–41)
Albumin: 3.8 g/dL (ref 3.5–5.0)
Alkaline Phosphatase: 93 U/L (ref 38–126)
Bilirubin, Direct: 0.1 mg/dL (ref 0.0–0.2)
Indirect Bilirubin: 0.2 mg/dL — ABNORMAL LOW (ref 0.3–0.9)
Total Bilirubin: 0.3 mg/dL (ref 0.3–1.2)
Total Protein: 6.6 g/dL (ref 6.5–8.1)

## 2020-05-29 LAB — RAPID URINE DRUG SCREEN, HOSP PERFORMED
Amphetamines: NOT DETECTED
Barbiturates: NOT DETECTED
Benzodiazepines: POSITIVE — AB
Cocaine: NOT DETECTED
Opiates: NOT DETECTED
Tetrahydrocannabinol: NOT DETECTED

## 2020-05-29 LAB — CBC WITH DIFFERENTIAL/PLATELET
Abs Immature Granulocytes: 0.03 10*3/uL (ref 0.00–0.07)
Basophils Absolute: 0.1 10*3/uL (ref 0.0–0.1)
Basophils Relative: 1 %
Eosinophils Absolute: 0 10*3/uL (ref 0.0–0.5)
Eosinophils Relative: 0 %
HCT: 38.5 % (ref 36.0–46.0)
Hemoglobin: 12.2 g/dL (ref 12.0–15.0)
Immature Granulocytes: 0 %
Lymphocytes Relative: 27 %
Lymphs Abs: 2.4 10*3/uL (ref 0.7–4.0)
MCH: 31.4 pg (ref 26.0–34.0)
MCHC: 31.7 g/dL (ref 30.0–36.0)
MCV: 99 fL (ref 80.0–100.0)
Monocytes Absolute: 0.6 10*3/uL (ref 0.1–1.0)
Monocytes Relative: 7 %
Neutro Abs: 6 10*3/uL (ref 1.7–7.7)
Neutrophils Relative %: 65 %
Platelets: 177 10*3/uL (ref 150–400)
RBC: 3.89 MIL/uL (ref 3.87–5.11)
RDW: 14.9 % (ref 11.5–15.5)
WBC: 9.1 10*3/uL (ref 4.0–10.5)
nRBC: 0 % (ref 0.0–0.2)

## 2020-05-29 LAB — CREATININE, SERUM
Creatinine, Ser: 0.91 mg/dL (ref 0.44–1.00)
GFR calc Af Amer: >60 mL/min (ref >60)
GFR calc non Af Amer: >60 mL/min (ref >60)

## 2020-05-29 LAB — ACETAMINOPHEN LEVEL
Acetaminophen (Tylenol), Serum: 31 ug/mL — ABNORMAL HIGH (ref 10–30)
Acetaminophen (Tylenol), Serum: <10 ug/mL — ABNORMAL LOW (ref 10–30)

## 2020-05-29 LAB — URINALYSIS, ROUTINE W REFLEX MICROSCOPIC
Bacteria, UA: NONE SEEN
Bilirubin Urine: NEGATIVE
Glucose, UA: NEGATIVE mg/dL
Hgb urine dipstick: NEGATIVE
Ketones, ur: NEGATIVE mg/dL
Nitrite: NEGATIVE
Protein, ur: NEGATIVE mg/dL
Specific Gravity, Urine: 1.017 (ref 1.005–1.030)
Squamous Epithelial / HPF: >50 — ABNORMAL HIGH (ref 0–5)
pH: 5 (ref 5.0–8.0)

## 2020-05-29 LAB — SALICYLATE LEVEL: Salicylate Lvl: <7 mg/dL — ABNORMAL LOW (ref 7.0–30.0)

## 2020-05-29 LAB — CORTISOL: Cortisol, Plasma: 2 ug/dL

## 2020-05-29 LAB — ECHOCARDIOGRAM COMPLETE
Area-P 1/2: 3.21 cm2
Height: 67 in
S' Lateral: 3.59 cm
Weight: 3844.82 oz

## 2020-05-29 LAB — D-DIMER, QUANTITATIVE: D-Dimer, Quant: <0.27 ug/mL-FEU (ref 0.00–0.50)

## 2020-05-29 LAB — LACTIC ACID, PLASMA: Lactic Acid, Venous: 1.1 mmol/L (ref 0.5–1.9)

## 2020-05-29 LAB — AMMONIA: Ammonia: 34 umol/L (ref 9–35)

## 2020-05-29 LAB — ETHANOL: Alcohol, Ethyl (B): <10 mg/dL (ref <10)

## 2020-05-29 LAB — CBG MONITORING, ED
Glucose-Capillary: 110 mg/dL — ABNORMAL HIGH (ref 70–99)
Glucose-Capillary: 122 mg/dL — ABNORMAL HIGH (ref 70–99)

## 2020-05-29 LAB — TSH: TSH: 2.027 u[IU]/mL (ref 0.350–4.500)

## 2020-05-29 LAB — SARS CORONAVIRUS 2 BY RT PCR (HOSPITAL ORDER, PERFORMED IN ~~LOC~~ HOSPITAL LAB): SARS Coronavirus 2: NEGATIVE

## 2020-05-29 MED ORDER — HEPARIN SODIUM (PORCINE) 5000 UNIT/ML IJ SOLN
5000.0000 [IU] | Freq: Three times a day (TID) | INTRAMUSCULAR | Status: DC
  Administered 2020-05-29: 5000 [IU] via SUBCUTANEOUS
  Filled 2020-05-29: qty 1

## 2020-05-29 MED ORDER — ACETAMINOPHEN 325 MG PO TABS: 650.0000 mg | ORAL_TABLET | Freq: Four times a day (QID) | ORAL | Status: DC | PRN

## 2020-05-29 MED ORDER — ONDANSETRON HCL 4 MG/2ML IJ SOLN
4.0000 mg | Freq: Once | INTRAMUSCULAR | Status: AC
  Administered 2020-05-29: 4 mg via INTRAVENOUS
  Filled 2020-05-29: qty 2

## 2020-05-29 MED ORDER — KETOROLAC TROMETHAMINE 30 MG/ML IJ SOLN
15.0000 mg | Freq: Once | INTRAMUSCULAR | Status: AC
  Administered 2020-05-29: 15 mg via INTRAVENOUS
  Filled 2020-05-29: qty 1

## 2020-05-29 MED ORDER — ASPIRIN EC 81 MG PO TBEC
81.0000 mg | DELAYED_RELEASE_TABLET | Freq: Every day | ORAL | Status: DC
  Administered 2020-05-29: 81 mg via ORAL
  Filled 2020-05-29: qty 1

## 2020-05-29 MED ORDER — SODIUM CHLORIDE 0.9 % IV BOLUS
1000.0000 mL | Freq: Once | INTRAVENOUS | Status: AC
  Administered 2020-05-29: 1000 mL via INTRAVENOUS

## 2020-05-29 MED ORDER — IBUPROFEN 400 MG PO TABS
200.0000 mg | ORAL_TABLET | Freq: Three times a day (TID) | ORAL | Status: DC | PRN
  Administered 2020-05-29: 200 mg via ORAL
  Filled 2020-05-29: qty 1

## 2020-05-29 MED ORDER — TRAMADOL HCL 50 MG PO TABS
50.0000 mg | ORAL_TABLET | Freq: Once | ORAL | Status: AC
  Administered 2020-05-29: 50 mg via ORAL
  Filled 2020-05-29: qty 1

## 2020-05-29 MED ORDER — SODIUM CHLORIDE 0.9 % IV SOLN: INTRAVENOUS | Status: DC
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
## 2020-05-29 NOTE — ED Notes (Signed)
Pt is tearful on admin of medications, she is asking if she will be down here through the night, I explained to her that we are waiting on bed placement upstairs. She talked about her daughter who recently passed away, seems to be stressful for the patient, encouraging words, asked if she needed anything else. She asked if her husband could come stay with her, explained visiting policy with her, and once she goes up stairs she could also have 1 designated visitor, visiting hours. I asked if she wanted her current visitor to leave, she says she wanted to talk with her first and then she would let me know.

## 2020-05-29 NOTE — H&P (Addendum)
TRH H&P   Patient Demographics:    Bellah Alia, is a 49 y.o. female  MRN: [id number]   DOB - Mar 29, 1971  Admit Date - 05/29/2020  Outpatient Primary MD for the patient is Jacquelin Hawking, PA-C  Referring MD/NP/PA: PA idol  Patient coming from: home  Chief Complaint  Patient presents with  . Hypotension      HPI:    Yasmyn Bellisario  is a 49 y.o. female, 34 hypertension, migraines, anxiety, patient presents with complaints of generalized headache, weakness, lightheadedness and drowsiness, she reports poor sleep, for which she has been taking Benadryl, as well reports she was given Xanax by a friend recently, for her headache, this headache resembles previous episodes, she does report nausea, no vomiting, no visual changes, no focal deficits, she was in ED yesterday secondary to hypotension which resolved with IV fluids, she declined admission then, he had chest pain then which did not recur since, he presents today again with generalized weakness, lightheadedness, where she was noted to be hypotensive, patient reports she is on Toprol-XL 100 mg oral daily for tachycardia, as well Norvasc 5 mg oral daily which she did take today, and reported history of CVA in 118, but reviewing records her's work-up was negative then. -in ED it was noted to be hypotensive and bradycardic, her blood pressure was 87/57, this did respond to fluid bolus, she was bradycardic on EKG at 37, but no pauses or heart block, currently she is feeling much better after receiving 2 L of fluid bolus, [facility] hospitalist consulted to admit.    Review of systems:    In addition to the HPI above,  No Fever-chills, reports weakness and fatigue No Headache, No changes with Vision or hearing, No problems swallowing food or Liquids, No Chest pain, Cough or Shortness of Breath, No Abdominal pain, reports nausea, but no  Vommitting, Bowel movements are regular, No Blood in stool or Urine, No dysuria, No new skin rashes or bruises, No new joints pains-aches,  No new weakness, tingling, numbness in any extremity, No recent weight gain or loss, No polyuria, polydypsia or polyphagia, No significant Mental Stressors.  She does report chronic headache.  A full 10 point Review of Systems was done, except as stated above, all other Review of Systems were negative.   With Past History of the following :    Past Medical History:  Diagnosis Date  . Anxiety   . Dyspnea on exertion 2013   Echo, EF =>55%  . Hypertension   . Migraines   . Stroke (HCC)   . Tachycardia   . TIA (transient ischemic attack)       Past Surgical History:  Procedure Laterality Date  . BACK SURGERY    . COSMETIC SURGERY     from dog attack  . HERNIA REPAIR    . TUBAL LIGATION  2004  Social History:     Social History   Tobacco Use  . Smoking status: Current Every Day Smoker    Packs/day: 1.00    Years: 26.00    Pack years: 26.00    Types: Cigarettes    Last attempt to quit: 12/26/2018    Years since quitting: 1.4  . Smokeless tobacco: Never Used  Substance Use Topics  . Alcohol use: No       Family History :     Family History  Problem Relation Age of Onset  . Heart disease Father   . Breast cancer Maternal Grandmother 45      Home Medications:   Prior to Admission medications   Medication Sig Start Date End Date Taking? Authorizing Provider  aspirin EC 81 MG tablet Take 81 mg by mouth daily.   Yes [provider]  ibuprofen (ADVIL) 200 MG tablet Take 800 mg by mouth every 6 (six) hours as needed.   Yes [provider]   As well patient endorses she is on Toprol-XL 100 mg oral daily, and Norvasc 5 mg oral daily.   Allergies:     Allergies  Allergen Reactions  . Sulfa Antibiotics Nausea And Vomiting     Physical Exam:   Vitals  Blood pressure 127/78, pulse 61,  temperature 97.6 F (36.4 C), temperature source Oral, resp. rate 15, height 5\' 7"  (1.702 m), weight 109 kg, last menstrual period 12/19/2014, SpO2 95 %.   1. General developed female lying in bed in NAD,  2. Normal affect and insight, Not Suicidal or Homicidal, Awake Alert, Oriented X 3.  3. No F.N deficits, ALL C.Nerves Intact, Strength 5/5 all 4 extremities, Sensation intact all 4 extremities, Plantars down going.  4. Ears and Eyes appear Normal, Conjunctivae clear, PERRLA. Moist Oral Mucosa.  5. Supple Neck, No JVD, No cervical lymphadenopathy appriciated, No Carotid Bruits.  6. Symmetrical Chest wall movement, Good air movement bilaterally, CTAB.  7. RRR(currently  in the 60s on telemetry), No Gallops, Rubs or Murmurs, No Parasternal Heave.  8. Positive Bowel Sounds, Abdomen Soft, No tenderness, No organomegaly appriciated,No rebound -guarding or rigidity.  9.  No Cyanosis, Normal Skin Turgor, No Skin Rash or Bruise.  10. Good muscle tone,  joints appear normal , no effusions, Normal ROM.  11. No Palpable Lymph Nodes in Neck or Axillae     Data Review:    CBC Recent Labs  Lab 05/28/20 1256 05/29/20 1035  WBC 10.2 9.1  HGB 13.5 12.2  HCT 41.9 38.5  PLT 254 177  MCV 97.9 99.0  MCH 31.5 31.4  MCHC 32.2 31.7  RDW 15.0 14.9  LYMPHSABS  --  2.4  MONOABS  --  0.6  EOSABS  --  0.0  BASOSABS  --  0.1   ------------------------------------------------------------------------------------------------------------------  Chemistries  Recent Labs  Lab 05/28/20 1256  NA 137  K 4.2  CL 103  CO2 22  GLUCOSE 105*  BUN 20  CREATININE 1.31*  CALCIUM 9.6   ------------------------------------------------------------------------------------------------------------------ estimated creatinine clearance is 64.6 mL/min (A) (by C-G formula based on SCr of 1.31 mg/dL  (H)). ------------------------------------------------------------------------------------------------------------------ Recent Labs    05/29/20 1035  TSH 2.027    Coagulation profile No results for input(s): INR, PROTIME in the last 168 hours. ------------------------------------------------------------------------------------------------------------------- No results for input(s): DDIMER in the last 72 hours. -------------------------------------------------------------------------------------------------------------------  Cardiac Enzymes No results for input(s): CKMB, TROPONINI, MYOGLOBIN in the last 168 hours.  Invalid input(s): CK ------------------------------------------------------------------------------------------------------------------ No results found for: BNP   ---------------------------------------------------------------------------------------------------------------  Urinalysis    Component Value Date/Time   COLORURINE YELLOW 05/29/2020 1026   APPEARANCEUR CLOUDY (A) 05/29/2020 1026   LABSPEC 1.017 05/29/2020 1026   PHURINE 5.0 05/29/2020 1026   GLUCOSEU NEGATIVE 05/29/2020 1026   HGBUR NEGATIVE 05/29/2020 1026   BILIRUBINUR NEGATIVE 05/29/2020 1026   KETONESUR NEGATIVE 05/29/2020 1026   PROTEINUR NEGATIVE 05/29/2020 1026   NITRITE NEGATIVE 05/29/2020 1026   LEUKOCYTESUR TRACE (A) 05/29/2020 1026    ----------------------------------------------------------------------------------------------------------------   Imaging Results:    MR BRAIN WO CONTRAST  Result Date: 05/29/2020 CLINICAL DATA:  49 year old female presenting with neurologic deficit yesterday. Dizziness, slurred speech, hypotension. EXAM: MRI HEAD WITHOUT CONTRAST TECHNIQUE: Multiplanar, multiecho pulse sequences of the brain and surrounding structures were obtained without intravenous contrast. COMPARISON:  Head CT 05/27/2020 and earlier. Brain MRI 09/13/2017. FINDINGS: Brain: No  restricted diffusion to suggest acute infarction. No midline shift, mass effect, evidence of mass lesion, ventriculomegaly, extra-axial collection or acute intracranial hemorrhage. Cervicomedullary junction and pituitary are within normal limits. Wallace Cullens and white matter signal appears stable since 2018 and within normal limits throughout the brain. No encephalomalacia or chronic cerebral blood products identified. Vascular: Major intracranial vascular flow voids are stable since 2018. Skull and upper cervical spine: Mild motion artifact on sagittal T1 weighted imaging. Visible bone marrow signal appears stable since 2018. Grossly negative visible cervical spine. Sinuses/Orbits: Mild left maxillary sinus mucosal thickening. Other sinuses are well pneumatized. Orbits appear negative. Other: Mastoids remain well pneumatized. Grossly negative visible internal auditory structures. Scalp and face appear negative. IMPRESSION: Stable since 2018 and normal noncontrast MRI appearance of the brain. Electronically Signed   By: Odessa Fleming M.D.   On: 05/29/2020 12:09   DG Chest Portable 1 View  Result Date: 05/29/2020 CLINICAL DATA:  Rhonchi.  Dizziness, slurred speech and hypotension. EXAM: PORTABLE CHEST 1 VIEW COMPARISON:  05/28/2020 FINDINGS: Cardiac enlargement. No pleural effusion or edema identified. No airspace opacities identified. Visualized osseous structures are unremarkable. IMPRESSION: No acute cardiopulmonary abnormalities. Electronically Signed   By: Signa Kell M.D.   On: 05/29/2020 10:46   DG Chest Port 1 View  Result Date: 05/28/2020 CLINICAL DATA:  Chest pain and slurred speech over the last 3 days. Headache. EXAM: PORTABLE CHEST 1 VIEW COMPARISON:  09/12/2017 FINDINGS: Mild cardiomegaly. Mediastinal shadows are normal. Question mild pulmonary venous hypertension. No frank edema. No infiltrate, collapse or effusion. IMPRESSION: Mild cardiomegaly. Question mild pulmonary venous hypertension. No frank  edema. Electronically Signed   By: Paulina Fusi M.D.   On: 05/28/2020 13:32    My personal review of EKG: Rhythm sinus bradycardia, Rate  47 /min, QTc 476 , no Acute ST changes   Assessment & Plan:    Active Problems:   Cigarette nicotine dependence, uncomplicated   Headache, migraine   Generalized weakness   Generalized weakness/bradycardia/hypotension -Patient symptoms most likely related to bradycardia and hypotension, I think this is most likely in the setting of her being on metoprolol XL 100 mg oral daily and Norvasc 5 mg oral daily spite being bradycardic with low blood pressure(patient has confirmed she has been taking these meds on a daily basis, and she did take them today as well), as well likely she has been using Benadryl recently, and she took Xanax from a friend recently. -She will be admitted to telemetry. - will obtain 2D echo,  - TSH within normal limit,  - will check cortisol level. -Continue with IV fluids, if work-up is negative likely can be discharged tomorrow, further  decisions about to resume her beta-blocker at a lower dose will be pending her work-up and vital signs during hospital stay.\ -MRI brain with no acute findings  Hypertension -She is hypotensive, I will go ahead and hold her medications  AKI -Creatinine was 1.3 yesterday, will repeat this admission, she is on IV fluids, avoid nephrotoxic medications   Of note patient acetaminophen level was noted to be at 31, been taking Fioricet for her headache, reports she took 3 tablets yesterday, I will check LFTs, I will discontinue her Tylenol, and will recheck Tylenol level as well to ensure it is trending down.  DVT Prophylaxis   Lovenox   AM Labs Ordered, also please review Full Orders  Family Communication: Admission, patients condition and plan of care including tests being ordered have been discussed with the patient  who indicate understanding and agree with the plan and Code Status.  Code Status  Full  Likely DC to  home  Condition GUARDED    Consults called:  None  Admission status: Observation  Time spent in minutes : 50 minutes   Huey Bienenstock M.D on 05/29/2020 at 5:06 PM   [facility] - Office  [phone]

## 2020-05-29 NOTE — Progress Notes (Signed)
*  PRELIMINARY RESULTS* Echocardiogram 2D Echocardiogram has been performed.  Stacey Drain 05/29/2020, 4:46 PM

## 2020-05-29 NOTE — ED Notes (Signed)
The neighbors daughter is at the bedside of the patient and requesting "information to tell the patients family, they are waiting for information". I asked specifically what information she needed so I can let the doctor know, states she (visitor) wants to speak to the doctor to get the results to tell family. I then asked the patient what questions she had she states "he told me all about my labs and stuff." denied that she had any further questions. Sent message to provider as the patient is still asking for medication for headache. Patient is aware of the same.

## 2020-05-29 NOTE — ED Triage Notes (Signed)
Pt reports continued dizziness,slurred speech, hypotension since last night.pt reports everything got worse an hour ago. Pt reports left AMA this morning and reported was to be admitted last night. Pt reports history of several TIAs. Pt alert and oriented. Speech slurred. Sensation/grip equal, facial symmetry noted. Pt reports left lower extremity weakness since previous stroke.

## 2020-05-30 LAB — HIV ANTIBODY (ROUTINE TESTING W REFLEX): HIV Screen 4th Generation wRfx: NONREACTIVE

## 2020-05-30 NOTE — ED Notes (Signed)
In to hourly round on the patient, she is tearful, she is stating she needs to leave. She does not want to stay. She states she is still grieving her daughter, has a service in the morning for her, and wants to be home with her husband and daughter. I explained that the only way would be for the patient to leave AMA. The visitor tells me "no she is not, get out of here before you upset her more". At this point, I requested for the patient visitor to step out while I speak with the patient. The patient once again says she wants to leave. I reinforced the reason she was admitted to the hospital. I called Dr. Robb Matar, explained to him the situation, he will not be coming to see the patient, States as long at the patient is a/o, has a ride home and agrees to sign out AMA, she can leave. In again to speak with the patient, she is tearful again. She agrees to sign out, states visitor will be taking her her. Pt asked to have NO information released to her visitor, states this is between "me any my husband, I do not want her to have any information". Agrees to return at any time if she wishes. Says she has a f/u appt with her doctor on Tuesday morning.

## 2021-06-06 ENCOUNTER — Encounter (HOSPITAL_COMMUNITY): Payer: Self-pay

## 2021-06-06 ENCOUNTER — Other Ambulatory Visit: Payer: Self-pay

## 2021-06-06 ENCOUNTER — Emergency Department (HOSPITAL_COMMUNITY)
Admission: EM | Admit: 2021-06-06 | Discharge: 2021-06-06 | Disposition: A | Discharge status: Home / Self Care | Payer: Self-pay | Attending: Emergency Medicine | Admitting: Emergency Medicine

## 2021-06-06 DIAGNOSIS — Z5321 Procedure and treatment not carried out due to patient leaving prior to being seen by health care provider: Secondary | ICD-10-CM | POA: Insufficient documentation

## 2021-06-06 DIAGNOSIS — F419 Anxiety disorder, unspecified: Secondary | ICD-10-CM | POA: Insufficient documentation

## 2021-06-06 NOTE — ED Triage Notes (Signed)
Pt arrived via POV, states she was very anxious earlier, called ambulance, was told she was having a panic attack and told to drive to ED. Hx of same.

## 2021-10-27 ENCOUNTER — Ambulatory Visit (INDEPENDENT_AMBULATORY_CARE_PROVIDER_SITE_OTHER): Payer: Self-pay

## 2021-10-27 ENCOUNTER — Other Ambulatory Visit: Payer: Self-pay

## 2021-10-27 ENCOUNTER — Encounter: Payer: Self-pay | Admitting: Emergency Medicine

## 2021-10-27 ENCOUNTER — Ambulatory Visit
Admission: EM | Admit: 2021-10-27 | Discharge: 2021-10-27 | Disposition: A | Discharge status: Home / Self Care | Payer: MEDICAID | Attending: Urgent Care | Admitting: Urgent Care

## 2021-10-27 DIAGNOSIS — M25561 Pain in right knee: Secondary | ICD-10-CM

## 2021-10-27 DIAGNOSIS — M25461 Effusion, right knee: Secondary | ICD-10-CM

## 2021-10-27 DIAGNOSIS — M1711 Unilateral primary osteoarthritis, right knee: Secondary | ICD-10-CM

## 2021-10-27 MED ORDER — PREDNISONE 50 MG PO TABS: 50.0000 mg | ORAL_TABLET | Freq: Every day | ORAL | 0 refills | Status: DC

## 2021-10-27 NOTE — ED Triage Notes (Signed)
Pt reports right knee pain x3 weeks. Pt reports pain has increasingly gotten worse and denies any known injury and intermittent swelling of RLE. Pt able to bear weight but walks with cane.

## 2021-10-27 NOTE — ED Provider Notes (Signed)
[facility]   MRN: [id number] DOB: 1971-02-25  Subjective:   Dominique Harrington is a 51 y.o. female presenting for 3-week history of persistent right knee pain with swelling, difficulty bearing weight, bending.  No trauma, fall.  No history of arthritis.  No history of knee problems.  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  She underwent a lot of physical therapy and did have to compensate a lot with the right side of her body.  Does not have an orthopedist.  No history of diabetes.  No current facility-administered medications for this encounter.  Current Outpatient Medications:    aspirin EC 81 MG tablet, Take 81 mg by mouth daily., Disp: , Rfl:    ibuprofen (ADVIL) 200 MG tablet, Take 800 mg by mouth every 6 (six) hours as needed., Disp: , Rfl:    Allergies  Allergen Reactions   Sulfa Antibiotics Nausea And Vomiting    Past Medical History:  Diagnosis Date   Anxiety    Dyspnea on exertion 2013   Echo, EF =>55%   Hypertension    Migraines    Stroke (HCC)    Tachycardia    TIA (transient ischemic attack)      Past Surgical History:  Procedure Laterality Date   BACK SURGERY     COSMETIC SURGERY     from dog attack   HERNIA REPAIR     TUBAL LIGATION  2004    Family History  Problem Relation Age of Onset   Heart disease Father    Breast cancer Maternal Grandmother 69    Social History   Tobacco Use   Smoking status: Every Day    Packs/day: 1.00    Years: 26.00    Pack years: 26.00    Types: Cigarettes    Last attempt to quit: 12/26/2018    Years since quitting: 2.8   Smokeless tobacco: Never  Vaping Use   Vaping Use: Never used  Substance Use Topics   Alcohol use: No   Drug use: No    ROS   Objective:   Vitals: BP (!) 161/102 (BP Location: Right Arm)    Pulse (!) 110    Temp 97.9 F (36.6 C) (Oral)    Resp 18    Ht 5\' 7"  (1.702 m)    Wt 241 lb (109.3 kg)    LMP 12/19/2014    SpO2 98%    BMI 37.75 kg/m    Physical Exam Constitutional:      General: She is not in acute distress.    Appearance: Normal appearance. She is well-developed. She is not ill-appearing, toxic-appearing or diaphoretic.  HENT:     Head: Normocephalic and atraumatic.     Nose: Nose normal.     Mouth/Throat:     Mouth: Mucous membranes are moist.  Eyes:     General: No scleral icterus.       Right eye: No discharge.        Left eye: No discharge.     Extraocular Movements: Extraocular movements intact.  Cardiovascular:     Rate and Rhythm: Normal rate.  Pulmonary:     Effort: Pulmonary effort is normal.  Musculoskeletal:     Right knee: Swelling, effusion and bony tenderness present. No deformity, erythema, ecchymosis, lacerations or crepitus. Decreased range of motion. Tenderness present over the medial joint line and patellar tendon. No lateral joint line tenderness. Normal alignment and normal patellar mobility.  Skin:  General: Skin is warm and dry.  Neurological:     General: No focal deficit present.     Mental Status: She is alert and oriented to person, place, and time.  Psychiatric:        Mood and Affect: Mood normal.        Behavior: Behavior normal.    DG Knee Complete 4 Views Right  Result Date: 10/27/2021 CLINICAL DATA:  3 week history of knee pain. EXAM: RIGHT KNEE - COMPLETE 4+ VIEW COMPARISON:  None. FINDINGS: No fracture. No subluxation or dislocation. Loss of joint space noted medial compartment. Hypertrophic spurring noted all 3 compartments. Small joint effusion evident. IMPRESSION: Tricompartmental degenerative changes with small joint effusion. Electronically Signed   By: Kennith Center M.D.   On: 10/27/2021 09:55     Assessment and Plan :   PDMP not reviewed this encounter.  1. Osteoarthritis of right knee, unspecified osteoarthritis type   2. Pain and swelling of right knee    Recommended an oral prednisone course and follow-up with an orthopedist.  Counseled on the general  nature of arthritis and she is agreeable to set up a consultation with an Ortho physician. Counseled patient on potential for adverse effects with medications prescribed/recommended today, ER and return-to-clinic precautions discussed, patient verbalized understanding.    Wallis Bamberg, PA-C 10/27/21 1013

## 2023-11-09 ENCOUNTER — Encounter (HOSPITAL_COMMUNITY): Payer: Self-pay

## 2023-11-09 ENCOUNTER — Inpatient Hospital Stay (HOSPITAL_COMMUNITY)
Admission: EM | Admit: 2023-11-09 | Discharge: 2023-11-10 | DRG: 193 | Disposition: A | Discharge status: Home / Self Care | Payer: Self-pay | Attending: Student in an Organized Health Care Education/Training Program | Admitting: Student in an Organized Health Care Education/Training Program

## 2023-11-09 ENCOUNTER — Emergency Department (HOSPITAL_COMMUNITY): Payer: Self-pay

## 2023-11-09 ENCOUNTER — Other Ambulatory Visit: Payer: Self-pay

## 2023-11-09 DIAGNOSIS — J101 Influenza due to other identified influenza virus with other respiratory manifestations: Principal | ICD-10-CM | POA: Insufficient documentation

## 2023-11-09 DIAGNOSIS — R4781 Slurred speech: Secondary | ICD-10-CM | POA: Insufficient documentation

## 2023-11-09 DIAGNOSIS — N3090 Cystitis, unspecified without hematuria: Secondary | ICD-10-CM

## 2023-11-09 DIAGNOSIS — Z8249 Family history of ischemic heart disease and other diseases of the circulatory system: Secondary | ICD-10-CM

## 2023-11-09 DIAGNOSIS — I10 Essential (primary) hypertension: Secondary | ICD-10-CM | POA: Diagnosis present

## 2023-11-09 DIAGNOSIS — Z79899 Other long term (current) drug therapy: Secondary | ICD-10-CM

## 2023-11-09 DIAGNOSIS — J189 Pneumonia, unspecified organism: Secondary | ICD-10-CM | POA: Diagnosis present

## 2023-11-09 DIAGNOSIS — Z882 Allergy status to sulfonamides status: Secondary | ICD-10-CM

## 2023-11-09 DIAGNOSIS — F17210 Nicotine dependence, cigarettes, uncomplicated: Secondary | ICD-10-CM | POA: Diagnosis present

## 2023-11-09 DIAGNOSIS — J9601 Acute respiratory failure with hypoxia: Secondary | ICD-10-CM

## 2023-11-09 DIAGNOSIS — R739 Hyperglycemia, unspecified: Secondary | ICD-10-CM | POA: Diagnosis present

## 2023-11-09 DIAGNOSIS — N3000 Acute cystitis without hematuria: Secondary | ICD-10-CM | POA: Insufficient documentation

## 2023-11-09 DIAGNOSIS — J188 Other pneumonia, unspecified organism: Secondary | ICD-10-CM | POA: Diagnosis present

## 2023-11-09 DIAGNOSIS — N3001 Acute cystitis with hematuria: Secondary | ICD-10-CM | POA: Diagnosis present

## 2023-11-09 DIAGNOSIS — E782 Mixed hyperlipidemia: Secondary | ICD-10-CM | POA: Diagnosis present

## 2023-11-09 DIAGNOSIS — F419 Anxiety disorder, unspecified: Secondary | ICD-10-CM | POA: Diagnosis present

## 2023-11-09 DIAGNOSIS — E876 Hypokalemia: Secondary | ICD-10-CM | POA: Diagnosis present

## 2023-11-09 DIAGNOSIS — Z1152 Encounter for screening for COVID-19: Secondary | ICD-10-CM

## 2023-11-09 DIAGNOSIS — Z803 Family history of malignant neoplasm of breast: Secondary | ICD-10-CM

## 2023-11-09 DIAGNOSIS — K219 Gastro-esophageal reflux disease without esophagitis: Secondary | ICD-10-CM | POA: Insufficient documentation

## 2023-11-09 DIAGNOSIS — G43909 Migraine, unspecified, not intractable, without status migrainosus: Secondary | ICD-10-CM | POA: Diagnosis present

## 2023-11-09 DIAGNOSIS — Z7982 Long term (current) use of aspirin: Secondary | ICD-10-CM

## 2023-11-09 DIAGNOSIS — J1000 Influenza due to other identified influenza virus with unspecified type of pneumonia: Principal | ICD-10-CM | POA: Diagnosis present

## 2023-11-09 DIAGNOSIS — Z8673 Personal history of transient ischemic attack (TIA), and cerebral infarction without residual deficits: Secondary | ICD-10-CM

## 2023-11-09 LAB — DIFFERENTIAL
Abs Immature Granulocytes: 0.02 10*3/uL (ref 0.00–0.07)
Basophils Absolute: 0 10*3/uL (ref 0.0–0.1)
Basophils Relative: 0 %
Eosinophils Absolute: 0 10*3/uL (ref 0.0–0.5)
Eosinophils Relative: 0 %
Immature Granulocytes: 0 %
Lymphocytes Relative: 7 %
Lymphs Abs: 0.6 10*3/uL — ABNORMAL LOW (ref 0.7–4.0)
Monocytes Absolute: 0.4 10*3/uL (ref 0.1–1.0)
Monocytes Relative: 5 %
Neutro Abs: 7 10*3/uL (ref 1.7–7.7)
Neutrophils Relative %: 88 %

## 2023-11-09 LAB — COMPREHENSIVE METABOLIC PANEL
ALT: 30 U/L (ref 0–44)
AST: 36 U/L (ref 15–41)
Albumin: 3.8 g/dL (ref 3.5–5.0)
Alkaline Phosphatase: 119 U/L (ref 38–126)
Anion gap: 13 (ref 5–15)
BUN: 13 mg/dL (ref 6–20)
CO2: 21 mmol/L — ABNORMAL LOW (ref 22–32)
Calcium: 8.4 mg/dL — ABNORMAL LOW (ref 8.9–10.3)
Chloride: 99 mmol/L (ref 98–111)
Creatinine, Ser: 1.07 mg/dL — ABNORMAL HIGH (ref 0.44–1.00)
GFR, Estimated: >60 mL/min (ref >60)
Glucose, Bld: 218 mg/dL — ABNORMAL HIGH (ref 70–99)
Potassium: 3.4 mmol/L — ABNORMAL LOW (ref 3.5–5.1)
Sodium: 133 mmol/L — ABNORMAL LOW (ref 135–145)
Total Bilirubin: 0.6 mg/dL (ref 0.0–1.2)
Total Protein: 7 g/dL (ref 6.5–8.1)

## 2023-11-09 LAB — URINALYSIS, ROUTINE W REFLEX MICROSCOPIC
Bilirubin Urine: NEGATIVE
Glucose, UA: NEGATIVE mg/dL
Ketones, ur: NEGATIVE mg/dL
Leukocytes,Ua: NEGATIVE
Nitrite: POSITIVE — AB
Protein, ur: 30 mg/dL — AB
Specific Gravity, Urine: 1.013 (ref 1.005–1.030)
pH: 5 (ref 5.0–8.0)

## 2023-11-09 LAB — CBC
HCT: 37.4 % (ref 36.0–46.0)
Hemoglobin: 12.7 g/dL (ref 12.0–15.0)
MCH: 31.7 pg (ref 26.0–34.0)
MCHC: 34 g/dL (ref 30.0–36.0)
MCV: 93.3 fL (ref 80.0–100.0)
Platelets: 133 10*3/uL — ABNORMAL LOW (ref 150–400)
RBC: 4.01 MIL/uL (ref 3.87–5.11)
RDW: 13.8 % (ref 11.5–15.5)
WBC: 8 10*3/uL (ref 4.0–10.5)
nRBC: 0 % (ref 0.0–0.2)

## 2023-11-09 LAB — RAPID URINE DRUG SCREEN, HOSP PERFORMED
Amphetamines: NOT DETECTED
Barbiturates: NOT DETECTED
Benzodiazepines: POSITIVE — AB
Cocaine: NOT DETECTED
Opiates: NOT DETECTED
Tetrahydrocannabinol: NOT DETECTED

## 2023-11-09 LAB — PROTIME-INR
INR: 1 (ref 0.8–1.2)
Prothrombin Time: 13.1 s (ref 11.4–15.2)

## 2023-11-09 LAB — RESP PANEL BY RT-PCR (RSV, FLU A&B, COVID)  RVPGX2
Influenza A by PCR: POSITIVE — AB
Influenza B by PCR: NEGATIVE
Resp Syncytial Virus by PCR: NEGATIVE
SARS Coronavirus 2 by RT PCR: NEGATIVE

## 2023-11-09 LAB — CBG MONITORING, ED: Glucose-Capillary: 180 mg/dL — ABNORMAL HIGH (ref 70–99)

## 2023-11-09 LAB — BLOOD GAS, VENOUS
Acid-Base Excess: 0.3 mmol/L (ref 0.0–2.0)
Bicarbonate: 24.6 mmol/L (ref 20.0–28.0)
Drawn by: 1528
O2 Saturation: 87.9 %
Patient temperature: 36.6
pCO2, Ven: 37 mm[Hg] — ABNORMAL LOW (ref 44–60)
pH, Ven: 7.43 (ref 7.25–7.43)
pO2, Ven: 53 mm[Hg] — ABNORMAL HIGH (ref 32–45)

## 2023-11-09 LAB — ACETAMINOPHEN LEVEL: Acetaminophen (Tylenol), Serum: 16 ug/mL (ref 10–30)

## 2023-11-09 LAB — ETHANOL: Alcohol, Ethyl (B): <10 mg/dL (ref <10)

## 2023-11-09 MED ORDER — IOHEXOL 350 MG/ML SOLN
80.0000 mL | Freq: Once | INTRAVENOUS | Status: AC | PRN
  Administered 2023-11-09: 80 mL via INTRAVENOUS

## 2023-11-09 MED ORDER — IOHEXOL 350 MG/ML SOLN
75.0000 mL | Freq: Once | INTRAVENOUS | Status: AC | PRN
  Administered 2023-11-09: 75 mL via INTRAVENOUS

## 2023-11-09 MED ORDER — SODIUM CHLORIDE 0.9 % IV SOLN
500.0000 mg | Freq: Once | INTRAVENOUS | Status: AC
  Administered 2023-11-09: 500 mg via INTRAVENOUS
  Filled 2023-11-09: qty 5

## 2023-11-09 MED ORDER — SODIUM CHLORIDE 0.9 % IV BOLUS
1000.0000 mL | Freq: Once | INTRAVENOUS | Status: AC
  Administered 2023-11-09: 1000 mL via INTRAVENOUS

## 2023-11-09 MED ORDER — SODIUM CHLORIDE 0.9 % IV SOLN
1.0000 g | Freq: Once | INTRAVENOUS | Status: AC
  Administered 2023-11-09: 1 g via INTRAVENOUS
  Filled 2023-11-09: qty 10

## 2023-11-09 MED ORDER — OSELTAMIVIR PHOSPHATE 75 MG PO CAPS
75.0000 mg | ORAL_CAPSULE | Freq: Once | ORAL | Status: AC
  Administered 2023-11-09: 75 mg via ORAL
  Filled 2023-11-09: qty 1

## 2023-11-09 NOTE — ED Notes (Signed)
Patient ambulated without assistance to bedside commode.

## 2023-11-09 NOTE — ED Notes (Signed)
Patient returned from MRI.

## 2023-11-09 NOTE — ED Provider Notes (Addendum)
 [place] EMERGENCY DEPARTMENT AT [facility] Provider Note   CSN: [id number] Arrival date & time: 11/09/23  1643     History  Chief Complaint  Patient presents with   Aphasia    Dominique Harrington is a 53 y.o. female.  HPI Patient presents for recent strokelike symptoms.  Medical history includes CVA, HTN, migraine headaches, anxiety.  She was last seen by neurology 6 years ago.  She was seen here in 2018 for strokelike symptoms.  She had a negative workup at the time.  She was previously prescribed topiramate for migraines, by neurology.  Over the past week, she has had cough and congestion.  She has been treating the symptoms with DayQuil and NyQuil.  Over the past 3 days, her significant other felt that she had slurred speech, confusion.  Last night, she complained of right sided weakness.  She has had a mild headache but no worse than what she typically has.  Currently, she denies any areas of numbness or weakness.  She states that her voice seems normal to her.    Home Medications Prior to Admission medications   Medication Sig Start Date End Date Taking? Authorizing Provider  amitriptyline (ELAVIL) 25 MG tablet Take 25 mg by mouth at bedtime.   Yes [provider]  amLODipine (NORVASC) 10 MG tablet Take 10 mg by mouth daily.   Yes [provider]  diphenhydrAMINE (BENADRYL) 25 mg capsule Take 25 mg by mouth every 6 (six) hours as needed.   Yes [provider]  escitalopram (LEXAPRO) 10 MG tablet Take 10 mg by mouth daily.   Yes [provider]  metoprolol succinate (TOPROL-XL) 25 MG 24 hr tablet Take 25 mg by mouth daily.   Yes [provider]  omeprazole (PRILOSEC) 10 MG capsule Take 20 mg by mouth daily.   Yes [provider]  tiZANidine (ZANAFLEX) 4 MG capsule Take 4 mg by mouth 3 (three) times daily.   Yes [provider]  aspirin EC 81 MG tablet Take 81 mg by mouth daily.    [provider]   ibuprofen (ADVIL) 200 MG tablet Take 800 mg by mouth every 6 (six) hours as needed.    [provider]  predniSONE (DELTASONE) 50 MG tablet Take 1 tablet (50 mg total) by mouth daily with breakfast. 10/27/21   Wallis Bamberg, PA-C      Allergies    Sulfa antibiotics    Review of Systems   Review of Systems  HENT:  Positive for congestion.   Respiratory:  Positive for cough.   Neurological:  Positive for speech difficulty, weakness and headaches.  Psychiatric/Behavioral:  Positive for confusion.   All other systems reviewed and are negative.   Physical Exam Updated Vital Signs BP 98/68   Pulse (!) 54   Temp 99.3 F (37.4 C) (Oral)   Resp 12   Ht 5\' 7"  (1.702 m)   Wt 110 kg   LMP 12/19/2014   SpO2 98%   BMI 37.98 kg/m  Physical Exam Vitals and nursing note reviewed.  Constitutional:      General: She is not in acute distress.    Appearance: Normal appearance. She is well-developed. She is not ill-appearing, toxic-appearing or diaphoretic.  HENT:     Head: Normocephalic and atraumatic.     Right Ear: External ear normal.     Left Ear: External ear normal.     Nose: Congestion present.     Mouth/Throat:  Mouth: Mucous membranes are moist.  Eyes:     Extraocular Movements: Extraocular movements intact.     Conjunctiva/sclera: Conjunctivae normal.  Cardiovascular:     Rate and Rhythm: Normal rate and regular rhythm.  Pulmonary:     Effort: Pulmonary effort is normal. No respiratory distress.  Abdominal:     General: There is no distension.     Palpations: Abdomen is soft.     Tenderness: There is no abdominal tenderness.  Musculoskeletal:        General: No swelling or deformity. Normal range of motion.     Cervical back: Normal range of motion and neck supple.     Right lower leg: No edema.     Left lower leg: No edema.  Skin:    General: Skin is warm and dry.     Capillary Refill: Capillary refill takes less than 2 seconds.     Coloration: Skin is not  jaundiced or pale.  Neurological:     General: No focal deficit present.     Mental Status: She is alert and oriented to person, place, and time.     Cranial Nerves: No cranial nerve deficit.     Sensory: No sensory deficit.     Motor: No weakness.     Coordination: Coordination normal.  Psychiatric:        Mood and Affect: Mood normal.        Behavior: Behavior normal.     ED Results / Procedures / Treatments   Labs (all labs ordered are listed, but only abnormal results are displayed) Labs Reviewed  RESP PANEL BY RT-PCR (RSV, FLU A&B, COVID)  RVPGX2 - Abnormal; Notable for the following components:      Result Value   Influenza A by PCR POSITIVE (*)    All other components within normal limits  CBC - Abnormal; Notable for the following components:   Platelets 133 (*)    All other components within normal limits  DIFFERENTIAL - Abnormal; Notable for the following components:   Lymphs Abs 0.6 (*)    All other components within normal limits  COMPREHENSIVE METABOLIC PANEL - Abnormal; Notable for the following components:   Sodium 133 (*)    Potassium 3.4 (*)    CO2 21 (*)    Glucose, Bld 218 (*)    Creatinine, Ser 1.07 (*)    Calcium 8.4 (*)    All other components within normal limits  RAPID URINE DRUG SCREEN, HOSP PERFORMED - Abnormal; Notable for the following components:   Benzodiazepines POSITIVE (*)    All other components within normal limits  URINALYSIS, ROUTINE W REFLEX MICROSCOPIC - Abnormal; Notable for the following components:   APPearance HAZY (*)    Hgb urine dipstick MODERATE (*)    Protein, ur 30 (*)    Nitrite POSITIVE (*)    Bacteria, UA RARE (*)    All other components within normal limits  BLOOD GAS, VENOUS - Abnormal; Notable for the following components:   pCO2, Ven 37 (*)    pO2, Ven 53 (*)    All other components within normal limits  CBG MONITORING, ED - Abnormal; Notable for the following components:   Glucose-Capillary 180 (*)    All  other components within normal limits  URINE CULTURE  PROTIME-INR  ETHANOL  ACETAMINOPHEN LEVEL    EKG EKG Interpretation Date/Time:  Tuesday November 09 2023 16:59:14 EST Ventricular Rate:  88 PR Interval:  162 QRS Duration:  90 QT Interval:  394 QTC Calculation: 476 R Axis:   -4  Text Interpretation: Normal sinus rhythm Confirmed by Gloris Manchester [phone]) on 11/09/2023 5:31:35 PM  Radiology CT ABDOMEN PELVIS W CONTRAST Result Date: 11/09/2023 CLINICAL DATA:  Abdominal pain EXAM: CT ABDOMEN AND PELVIS WITH CONTRAST TECHNIQUE: Multidetector CT imaging of the abdomen and pelvis was performed using the standard protocol following bolus administration of intravenous contrast. RADIATION DOSE REDUCTION: This exam was performed according to the departmental dose-optimization program which includes automated exposure control, adjustment of the mA and/or kV according to patient size and/or use of iterative reconstruction technique. CONTRAST:  80mL OMNIPAQUE IOHEXOL 350 MG/ML SOLN COMPARISON:  None Available. FINDINGS: Lower chest: See chest CT report today. Hepatobiliary: No focal hepatic abnormality. Gallbladder unremarkable. Pancreas: Fatty replacement. No focal abnormality or ductal dilatation. Spleen: No focal abnormality.  Normal size. Adrenals/Urinary Tract: No suspicious renal or adrenal lesion. No hydronephrosis. Contrast material within the collecting systems and bladder preclude assessment for stones. Urinary bladder unremarkable. Stomach/Bowel: Stomach, large and small bowel grossly unremarkable. Vascular/Lymphatic: No evidence of aneurysm or adenopathy. Reproductive: Uterus and adnexa unremarkable.  No mass. Other: No free fluid or free air.  Prior ventral hernia repair Musculoskeletal: No acute bony abnormality. IMPRESSION: No acute findings in the abdomen or pelvis. Electronically Signed   By: Charlett Nose M.D.   On: 11/09/2023 21:12   CT Angio Chest PE W and/or Wo Contrast Result Date:  11/09/2023 CLINICAL DATA:  Pulmonary embolism (PE) suspected, high prob. Cough. EXAM: CT ANGIOGRAPHY CHEST WITH CONTRAST TECHNIQUE: Multidetector CT imaging of the chest was performed using the standard protocol during bolus administration of intravenous contrast. Multiplanar CT image reconstructions and MIPs were obtained to evaluate the vascular anatomy. RADIATION DOSE REDUCTION: This exam was performed according to the departmental dose-optimization program which includes automated exposure control, adjustment of the mA and/or kV according to patient size and/or use of iterative reconstruction technique. CONTRAST:  80mL OMNIPAQUE IOHEXOL 350 MG/ML SOLN COMPARISON:  Chest x-ray today. FINDINGS: Cardiovascular: No filling defects in the pulmonary arteries to suggest pulmonary emboli. Heart borderline in size. Aorta normal caliber. Mediastinum/Nodes: Scattered borderline and mildly enlarged mediastinal lymph nodes, the largest in the anterior mediastinum measuring 1.5 cm. No axillary or hilar adenopathy. Trachea and esophagus are unremarkable. Thyroid unremarkable. Lungs/Pleura: Scattered ground-glass opacities in the upper lobes and right lower lobe. No effusions. Upper Abdomen: No acute findings Musculoskeletal: Chest wall soft tissues are unremarkable. No acute bony abnormality. Review of the MIP images confirms the above findings. IMPRESSION: Scattered ground-glass opacities in the upper lobes and right lower lobe concerning for early infiltrates/pneumonia. No evidence of pulmonary embolus. Prominent mediastinal lymph nodes, likely reactive. These could be followed with repeat CT in 6 months to ensure stability. Electronically Signed   By: Charlett Nose M.D.   On: 11/09/2023 21:10   MR BRAIN WO CONTRAST Result Date: 11/09/2023 CLINICAL DATA:  Neuro deficit, acute, stroke suspected. EXAM: MRI HEAD WITHOUT CONTRAST TECHNIQUE: Multiplanar, multiecho pulse sequences of the brain and surrounding structures were  obtained without intravenous contrast. COMPARISON:  CTA head/neck from today. FINDINGS: Brain: No acute infarction, hemorrhage, hydrocephalus, extra-axial collection or mass lesion. Mild T2/FLAIR hyperintense white matter are nonspecific but considered within normal limits for patient age and most likely secondary to chronic microvascular ischemic change. Vascular: Major arterial flow voids are maintained at the skull base. Skull and upper cervical spine: Normal marrow signal. Sinuses/Orbits: Clear sinuses.  No acute orbital findings. Other: No sizable mastoid effusions. IMPRESSION: Normal brain  MRI for patient age.  No acute abnormality. Electronically Signed   By: Feliberto Harts M.D.   On: 11/09/2023 20:01   CT ANGIO HEAD NECK W WO CM Result Date: 11/09/2023 CLINICAL DATA:  Neuro deficit, acute, stroke suspected. Slurred speech. Right-sided weakness. Confusion. EXAM: CT ANGIOGRAPHY HEAD AND NECK WITH AND WITHOUT CONTRAST TECHNIQUE: Multidetector CT imaging of the head and neck was performed using the standard protocol during bolus administration of intravenous contrast. Multiplanar CT image reconstructions and MIPs were obtained to evaluate the vascular anatomy. Carotid stenosis measurements (when applicable) are obtained utilizing NASCET criteria, using the distal internal carotid diameter as the denominator. RADIATION DOSE REDUCTION: This exam was performed according to the departmental dose-optimization program which includes automated exposure control, adjustment of the mA and/or kV according to patient size and/or use of iterative reconstruction technique. CONTRAST:  75mL OMNIPAQUE IOHEXOL 350 MG/ML SOLN COMPARISON:  MRI same day FINDINGS: CT HEAD FINDINGS Brain: The brain shows a normal appearance without evidence of malformation, atrophy, old or acute small or large vessel infarction, mass lesion, hemorrhage, hydrocephalus or extra-axial collection. Vascular: No hyperdense vessel. No evidence of  atherosclerotic calcification. Skull: Normal.  No traumatic finding.  No focal bone lesion. Sinuses/Orbits: Sinuses are clear. Orbits appear normal. Mastoids are clear. Other: None significant CTA NECK FINDINGS Aortic arch: Normal Right carotid system: Common carotid artery is widely patent. There is calcified plaque at the carotid bifurcation and ICA bulb but no stenosis. Cervical ICA is tortuous but widely patent. Left carotid system: Common carotid artery widely patent to the bifurcation. Soft and calcified plaque at the carotid bifurcation and ICA bulb but no stenosis. Cervical ICA is tortuous but widely patent. Vertebral arteries: No proximal subclavian stenosis. Both vertebral artery origins are widely patent. Both vertebral arteries are normal through the cervical region to the foramen magnum. Skeleton: Mild cervical spondylosis. Other neck: No mass or lymphadenopathy. Upper chest: Mild patchy density in the upper lobes which could be scarring or mild bronchopneumonia. Review of the MIP images confirms the above findings CTA HEAD FINDINGS Anterior circulation: Both internal carotid arteries are patent through the skull base and siphon regions. No siphon stenosis. Anterior and middle cerebral vessels are normal. No large vessel occlusion or proximal stenosis. No aneurysm or vascular malformation. Posterior circulation: Both vertebral arteries widely patent to the basilar artery. No basilar stenosis. Posterior circulation branch vessels are normal. Venous sinuses: Patent and normal. Anatomic variants: None significant. Review of the MIP images confirms the above findings IMPRESSION: 1. Normal head CT. 2. No intracranial large or medium vessel occlusion or correctable proximal stenosis. 3. Bilateral carotid bifurcation atherosclerosis but no stenosis. Tortuous cervical internal carotid arteries bilaterally. 4. Mild patchy density in the upper lobes which could be scarring or mild bronchopneumonia. Electronically  Signed   By: Paulina Fusi M.D.   On: 11/09/2023 19:22   DG Chest Portable 1 View Result Date: 11/09/2023 CLINICAL DATA:  Cough, stroke symptoms EXAM: PORTABLE CHEST 1 VIEW COMPARISON:  05/29/2020 FINDINGS: The heart size and mediastinal contours are within normal limits. Both lungs are clear. The visualized skeletal structures are unremarkable. IMPRESSION: No active disease. Electronically Signed   By: Sharlet Salina M.D.   On: 11/09/2023 18:24    Procedures Procedures    Medications Ordered in ED Medications  azithromycin (ZITHROMAX) 500 mg in sodium chloride 0.9 % 250 mL IVPB (has no administration in time range)  oseltamivir (TAMIFLU) capsule 75 mg (has no administration in time range)  cefTRIAXone (ROCEPHIN)  1 g in sodium chloride 0.9 % 100 mL IVPB (0 g Intravenous Stopped 11/09/23 2010)  iohexol (OMNIPAQUE) 350 MG/ML injection 75 mL (75 mLs Intravenous Contrast Given 11/09/23 1842)  sodium chloride 0.9 % bolus 1,000 mL (0 mLs Intravenous Stopped 11/09/23 2150)  iohexol (OMNIPAQUE) 350 MG/ML injection 80 mL (80 mLs Intravenous Contrast Given 11/09/23 2036)    ED Course/ Medical Decision Making/ A&P                                 Medical Decision Making Amount and/or Complexity of Data Reviewed Labs: ordered. Radiology: ordered.  Risk Prescription drug management. Decision regarding hospitalization.   This patient presents to the ED for concern of confusion and fatigue, this involves an extensive number of treatment options, and is a complaint that carries with it a high risk of complications and morbidity.  The differential diagnosis includes CVA, polypharmacy, infection, metabolic derangements   Co morbidities that complicate the patient evaluation  CVA, HTN, migraine headaches, anxiety   Additional history obtained:  Additional history obtained from patient's significant other External records from outside source obtained and reviewed including EMR   Lab Tests:  I  Ordered, and personally interpreted labs.  The pertinent results include: Normal hemoglobin, no leukocytosis, normal kidney function, mild hypokalemia with otherwise normal electrolytes.  Patient tested positive for influenza A.  Urinalysis does show evidence of infection.  UDS was positive for benzodiazepines.   Imaging Studies ordered:  I ordered imaging studies including CTA head and neck, MRI brain, CTA chest, CT of abdomen and pelvis I independently visualized and interpreted imaging which showed no acute findings on CTA head and neck or MRI brain.  CTA chest does show evidence of multifocal pneumonia.  There were no acute intra-abdominal findings. I agree with the radiologist interpretation   Cardiac Monitoring: / EKG:  The patient was maintained on a cardiac monitor.  I personally viewed and interpreted the cardiac monitored which showed an underlying rhythm of: Sinus rhythm  Problem List / ED Course / Critical interventions / Medication management  Patient presenting for concern of slurred speech and confusion over the past 3 days.  She also told her significant other last night that she felt weak on the right side.  She does not remember this.  She currently denies any areas of weakness or numbness.  On exam, she is overall well-appearing.  I do not appreciate any slurred speech.  She does not have any focal neurologic deficits.  Workup was initiated for possible TIA.  Initial lab work notable for evidence of UTI.  Ceftriaxone was ordered.  Patient also tested positive for influenza A.  Patient underwent CTA of head and neck in addition to MRI brain.  Results were negative for acute intracranial findings.  While in the ED, she had softening of blood pressures.  IV fluids were ordered.  Blood pressures improved following bolus IVF.  She had increased somnolence.  UDS was positive for benzodiazepines.  Patient adamantly denies use of benzos.  Given her softening of blood pressures while in the  ED, CTA of chest and CT of abdomen and pelvis were ordered.  CT of chest does show evidence of multifocal pneumonia.  Azithromycin was ordered to expand antibiotic coverage.  Patient has a 2.5 L new supplemental oxygen requirement.  Patient to be admitted for further management. I ordered medication including ceftriaxone and azithromycin for multifocal pneumonia and UTI; Tamiflu  for influenza A; IV fluids for hydration Reevaluation of the patient after these medicines showed that the patient improved I have reviewed the patients home medicines and have made adjustments as needed   Social Determinants of Health:  Has PCP  CRITICAL CARE Performed by: Gloris Manchester   Total critical care time: 35 minutes  Critical care time was exclusive of separately billable procedures and treating other patients.  Critical care was necessary to treat or prevent imminent or life-threatening deterioration.  Critical care was time spent personally by me on the following activities: development of treatment plan with patient and/or surrogate as well as nursing, discussions with consultants, evaluation of patient's response to treatment, examination of patient, obtaining history from patient or surrogate, ordering and performing treatments and interventions, ordering and review of laboratory studies, ordering and review of radiographic studies, pulse oximetry and re-evaluation of patient's condition.       Final Clinical Impression(s) / ED Diagnoses Final diagnoses:  Influenza A  Multifocal pneumonia  Acute respiratory failure with hypoxia [facility])  Cystitis    Rx / DC Orders ED Discharge Orders     None         Gloris Manchester, MD 11/09/23 2155    Gloris Manchester, MD 11/09/23 9562    Gloris Manchester, MD 11/09/23 2156

## 2023-11-09 NOTE — ED Notes (Signed)
Patient transported to CT

## 2023-11-09 NOTE — ED Notes (Signed)
Pt placed on 3L of O2 due to O2 sats at 86% while sleeping; EDP made aware

## 2023-11-09 NOTE — ED Triage Notes (Signed)
Pt arrived via POV c/o possible stroke. Pt reports last being well 3 days ago. Pts significant other present in Triage to help provide collateral information. Pt presents with slurred speech, right side weakness and confusion.

## 2023-11-09 NOTE — ED Notes (Signed)
Pt gone to CT and MRi

## 2023-11-09 NOTE — H&P (Incomplete)
History and Physical    Patient: Dominique Harrington:[id number] DOB: 1970-12-24 DOA: 11/09/2023 DOS: the patient was seen and examined on 11/10/2023 PCP: Benita Stabile, MD  Patient coming from: Home  Chief Complaint:  Chief Complaint  Patient presents with   Aphasia   HPI: Dominique Harrington is a 53 y.o. female with medical history significant of hypertension, hyperlipidemia, migraine, GERD who presents to the emergency department due to confusion, hallucination, slurred speech which started about 3 days ago, she complained of right-sided weakness last night.  Patient endorsed 1 week onset of cough and chest congestion which was being treated with NyQuil and DayQuil.  At bedside, she denies any weakness on the right side.  She states she was recently started on amitriptyline and she thought that this may be the cause of her symptoms.  Patient also complaining of increased urinary frequency within the last few days, though she denies burning sensation on urination.  ED Course:  In the emergency department, patient was tachypneic and bradycardic.  BP on arrival was 100/86, other vital signs are within normal range.  Workup in the ED showed normal CBC except for platelets of 133.  BMP showed sodium 133, potassium 3.4, chloride 99, bicarb 21, blood glucose 218, BUN 13, creatinine 1.07, calcium 8.4.  Alcohol level was less than 10, Tylenol level was 16, urine drug screen was positive for benzodiazepine, urinalysis was positive for nitrite and moderate hematuria.  Influenza A was positive.  Influenza B, SARS coronavirus 2, RSV was negative. CT abdomen and pelvis with contrast showed no acute findings in the abdomen or pelvis CT angio chest PE showed no evidence of pulmonary embolus, but showed scattered  ground-glass opacities in the upper lobes and right lower lobe concerning for early infiltrates/pneumonia. MRI brain without contrast showed normal brain MRI for patient age.  No acute abnormality CT  angiography head and neck with and without contrast showed normal head CT no intracranial large or medium vessel occlusion or correctable proximal stenosis. Patient was treated with IV ceftriaxone and azithromycin, Tamiflu was given due to influenza A, IV hydration was provided.  Hospitalist was asked to admit patient for further evaluation and management.  Review of Systems: Review of systems as noted in the HPI. All other systems reviewed and are negative.   Past Medical History:  Diagnosis Date   Anxiety    Dyspnea on exertion 2013   Echo, EF =>55%   Hypertension    Migraines    Stroke (HCC)    Tachycardia    TIA (transient ischemic attack)    Past Surgical History:  Procedure Laterality Date   BACK SURGERY     COSMETIC SURGERY     from dog attack   HERNIA REPAIR     TUBAL LIGATION  2004    Social History:  reports that she has been smoking cigarettes. She started smoking about 30 years ago. She has a 26 pack-year smoking history. She has never used smokeless tobacco. She reports that she does not drink alcohol and does not use drugs.   Allergies  Allergen Reactions   Sulfa Antibiotics Nausea And Vomiting    Family History  Problem Relation Age of Onset   Heart disease Father    Breast cancer Maternal Grandmother 45     Prior to Admission medications   Medication Sig Start Date End Date Taking? Authorizing Provider  amitriptyline (ELAVIL) 25 MG tablet Take 25 mg by mouth at bedtime.   Yes [provider]  amLODipine (NORVASC) 10 MG tablet Take 10 mg by mouth daily.   Yes [provider]  aspirin EC 81 MG tablet Take 81 mg by mouth daily.   Yes [provider]  atorvastatin (LIPITOR) 20 MG tablet Take 20 mg by mouth daily.   Yes [provider]  butalbital-acetaminophen-caffeine (FIORICET) 50-325-40 MG tablet Take 1 tablet by mouth every 8 (eight) hours as needed for migraine. 09/13/23  Yes [provider]  escitalopram  (LEXAPRO) 20 MG tablet Take 20 mg by mouth daily.   Yes [provider]  ibuprofen (ADVIL) 200 MG tablet Take 800 mg by mouth every 6 (six) hours as needed (migraine).   Yes [provider]  metoprolol succinate (TOPROL-XL) 25 MG 24 hr tablet Take 25 mg by mouth daily.   Yes [provider]  omeprazole (PRILOSEC) 10 MG capsule Take 20 mg by mouth daily.   Yes [provider]  promethazine (PHENERGAN) 25 MG tablet Take 1 tablet by mouth every 4 (four) hours as needed for nausea or vomiting. 10/29/23  Yes [provider]  Pseudoeph-Doxylamine-DM-APAP (DAYQUIL/NYQUIL COLD/FLU RELIEF PO) Take 30 mLs by mouth every 6 (six) hours as needed (cold/flu-like symptoms).   Yes [provider]  tiZANidine (ZANAFLEX) 4 MG capsule Take 4 mg by mouth 3 (three) times daily.   Yes [provider]    Physical Exam: BP 108/75   Pulse 61   Temp 99.3 F (37.4 C) (Oral)   Resp (!) 27   Ht 5\' 7"  (1.702 m)   Wt 110 kg   LMP 12/19/2014   SpO2 98%   BMI 37.98 kg/m   General: 53 y.o. year-old female well developed well nourished in no acute distress.  Alert and oriented x3. HEENT: NCAT, EOMI Neck: Supple, trachea medial Cardiovascular: Regular rate and rhythm with no rubs or gallops.  No thyromegaly or JVD noted.  No lower extremity edema. 2/4 pulses in all 4 extremities. Respiratory: Clear to auscultation with no wheezes or rales. Good inspiratory effort. Abdomen: Soft, nontender nondistended with normal bowel sounds x4 quadrants. Muskuloskeletal: No cyanosis, clubbing or edema noted bilaterally Neuro: CN II-XII intact, strength 5/5 x 4, sensation, reflexes intact Skin: No ulcerative lesions noted or rashes Psychiatry: Judgement and insight appear normal. Mood is appropriate for condition and setting          Labs on Admission:  Basic Metabolic Panel: Recent Labs  Lab 11/09/23 1729  NA 133*  K 3.4*  CL 99  CO2 21*  GLUCOSE 218*  BUN 13   CREATININE 1.07*  CALCIUM 8.4*   Liver Function Tests: Recent Labs  Lab 11/09/23 1729  AST 36  ALT 30  ALKPHOS 119  BILITOT 0.6  PROT 7.0  ALBUMIN 3.8   No results for input(s): "LIPASE", "AMYLASE" in the last 168 hours. No results for input(s): "AMMONIA" in the last 168 hours. CBC: Recent Labs  Lab 11/09/23 1729 11/10/23 0208  WBC 8.0 5.6  NEUTROABS 7.0  --   HGB 12.7 11.3*  HCT 37.4 34.2*  MCV 93.3 96.3  PLT 133* 111*   Cardiac Enzymes: No results for input(s): "CKTOTAL", "CKMB", "CKMBINDEX", "TROPONINI" in the last 168 hours.  BNP (last 3 results) No results for input(s): "BNP" in the last 8760 hours.  ProBNP (last 3 results) No results for input(s): "PROBNP" in the last 8760 hours.  CBG: Recent Labs  Lab 11/09/23 1658  GLUCAP 180*    Radiological Exams on Admission: CT ABDOMEN  PELVIS W CONTRAST Result Date: 11/09/2023 CLINICAL DATA:  Abdominal pain EXAM: CT ABDOMEN AND PELVIS WITH CONTRAST TECHNIQUE: Multidetector CT imaging of the abdomen and pelvis was performed using the standard protocol following bolus administration of intravenous contrast. RADIATION DOSE REDUCTION: This exam was performed according to the departmental dose-optimization program which includes automated exposure control, adjustment of the mA and/or kV according to patient size and/or use of iterative reconstruction technique. CONTRAST:  80mL OMNIPAQUE IOHEXOL 350 MG/ML SOLN COMPARISON:  None Available. FINDINGS: Lower chest: See chest CT report today. Hepatobiliary: No focal hepatic abnormality. Gallbladder unremarkable. Pancreas: Fatty replacement. No focal abnormality or ductal dilatation. Spleen: No focal abnormality.  Normal size. Adrenals/Urinary Tract: No suspicious renal or adrenal lesion. No hydronephrosis. Contrast material within the collecting systems and bladder preclude assessment for stones. Urinary bladder unremarkable. Stomach/Bowel: Stomach, large and small bowel grossly  unremarkable. Vascular/Lymphatic: No evidence of aneurysm or adenopathy. Reproductive: Uterus and adnexa unremarkable.  No mass. Other: No free fluid or free air.  Prior ventral hernia repair Musculoskeletal: No acute bony abnormality. IMPRESSION: No acute findings in the abdomen or pelvis. Electronically Signed   By: Charlett Nose M.D.   On: 11/09/2023 21:12   CT Angio Chest PE W and/or Wo Contrast Result Date: 11/09/2023 CLINICAL DATA:  Pulmonary embolism (PE) suspected, high prob. Cough. EXAM: CT ANGIOGRAPHY CHEST WITH CONTRAST TECHNIQUE: Multidetector CT imaging of the chest was performed using the standard protocol during bolus administration of intravenous contrast. Multiplanar CT image reconstructions and MIPs were obtained to evaluate the vascular anatomy. RADIATION DOSE REDUCTION: This exam was performed according to the departmental dose-optimization program which includes automated exposure control, adjustment of the mA and/or kV according to patient size and/or use of iterative reconstruction technique. CONTRAST:  80mL OMNIPAQUE IOHEXOL 350 MG/ML SOLN COMPARISON:  Chest x-ray today. FINDINGS: Cardiovascular: No filling defects in the pulmonary arteries to suggest pulmonary emboli. Heart borderline in size. Aorta normal caliber. Mediastinum/Nodes: Scattered borderline and mildly enlarged mediastinal lymph nodes, the largest in the anterior mediastinum measuring 1.5 cm. No axillary or hilar adenopathy. Trachea and esophagus are unremarkable. Thyroid unremarkable. Lungs/Pleura: Scattered ground-glass opacities in the upper lobes and right lower lobe. No effusions. Upper Abdomen: No acute findings Musculoskeletal: Chest wall soft tissues are unremarkable. No acute bony abnormality. Review of the MIP images confirms the above findings. IMPRESSION: Scattered ground-glass opacities in the upper lobes and right lower lobe concerning for early infiltrates/pneumonia. No evidence of pulmonary embolus. Prominent  mediastinal lymph nodes, likely reactive. These could be followed with repeat CT in 6 months to ensure stability. Electronically Signed   By: Charlett Nose M.D.   On: 11/09/2023 21:10   MR BRAIN WO CONTRAST Result Date: 11/09/2023 CLINICAL DATA:  Neuro deficit, acute, stroke suspected. EXAM: MRI HEAD WITHOUT CONTRAST TECHNIQUE: Multiplanar, multiecho pulse sequences of the brain and surrounding structures were obtained without intravenous contrast. COMPARISON:  CTA head/neck from today. FINDINGS: Brain: No acute infarction, hemorrhage, hydrocephalus, extra-axial collection or mass lesion. Mild T2/FLAIR hyperintense white matter are nonspecific but considered within normal limits for patient age and most likely secondary to chronic microvascular ischemic change. Vascular: Major arterial flow voids are maintained at the skull base. Skull and upper cervical spine: Normal marrow signal. Sinuses/Orbits: Clear sinuses.  No acute orbital findings. Other: No sizable mastoid effusions. IMPRESSION: Normal brain MRI for patient age.  No acute abnormality. Electronically Signed   By: Feliberto Harts M.D.   On: 11/09/2023 20:01   CT ANGIO HEAD NECK  W WO CM Result Date: 11/09/2023 CLINICAL DATA:  Neuro deficit, acute, stroke suspected. Slurred speech. Right-sided weakness. Confusion. EXAM: CT ANGIOGRAPHY HEAD AND NECK WITH AND WITHOUT CONTRAST TECHNIQUE: Multidetector CT imaging of the head and neck was performed using the standard protocol during bolus administration of intravenous contrast. Multiplanar CT image reconstructions and MIPs were obtained to evaluate the vascular anatomy. Carotid stenosis measurements (when applicable) are obtained utilizing NASCET criteria, using the distal internal carotid diameter as the denominator. RADIATION DOSE REDUCTION: This exam was performed according to the departmental dose-optimization program which includes automated exposure control, adjustment of the mA and/or kV according to  patient size and/or use of iterative reconstruction technique. CONTRAST:  75mL OMNIPAQUE IOHEXOL 350 MG/ML SOLN COMPARISON:  MRI same day FINDINGS: CT HEAD FINDINGS Brain: The brain shows a normal appearance without evidence of malformation, atrophy, old or acute small or large vessel infarction, mass lesion, hemorrhage, hydrocephalus or extra-axial collection. Vascular: No hyperdense vessel. No evidence of atherosclerotic calcification. Skull: Normal.  No traumatic finding.  No focal bone lesion. Sinuses/Orbits: Sinuses are clear. Orbits appear normal. Mastoids are clear. Other: None significant CTA NECK FINDINGS Aortic arch: Normal Right carotid system: Common carotid artery is widely patent. There is calcified plaque at the carotid bifurcation and ICA bulb but no stenosis. Cervical ICA is tortuous but widely patent. Left carotid system: Common carotid artery widely patent to the bifurcation. Soft and calcified plaque at the carotid bifurcation and ICA bulb but no stenosis. Cervical ICA is tortuous but widely patent. Vertebral arteries: No proximal subclavian stenosis. Both vertebral artery origins are widely patent. Both vertebral arteries are normal through the cervical region to the foramen magnum. Skeleton: Mild cervical spondylosis. Other neck: No mass or lymphadenopathy. Upper chest: Mild patchy density in the upper lobes which could be scarring or mild bronchopneumonia. Review of the MIP images confirms the above findings CTA HEAD FINDINGS Anterior circulation: Both internal carotid arteries are patent through the skull base and siphon regions. No siphon stenosis. Anterior and middle cerebral vessels are normal. No large vessel occlusion or proximal stenosis. No aneurysm or vascular malformation. Posterior circulation: Both vertebral arteries widely patent to the basilar artery. No basilar stenosis. Posterior circulation branch vessels are normal. Venous sinuses: Patent and normal. Anatomic variants: None  significant. Review of the MIP images confirms the above findings IMPRESSION: 1. Normal head CT. 2. No intracranial large or medium vessel occlusion or correctable proximal stenosis. 3. Bilateral carotid bifurcation atherosclerosis but no stenosis. Tortuous cervical internal carotid arteries bilaterally. 4. Mild patchy density in the upper lobes which could be scarring or mild bronchopneumonia. Electronically Signed   By: Paulina Fusi M.D.   On: 11/09/2023 19:22   DG Chest Portable 1 View Result Date: 11/09/2023 CLINICAL DATA:  Cough, stroke symptoms EXAM: PORTABLE CHEST 1 VIEW COMPARISON:  05/29/2020 FINDINGS: The heart size and mediastinal contours are within normal limits. Both lungs are clear. The visualized skeletal structures are unremarkable. IMPRESSION: No active disease. Electronically Signed   By: Sharlet Salina M.D.   On: 11/09/2023 18:24    EKG: I independently viewed the EKG done and my findings are as followed: Normal sinus rhythm at a rate of 88 bpm  Assessment/Plan Present on Admission:  Multifocal pneumonia  Essential hypertension, benign  Mixed hyperlipidemia  Principal Problem:   Multifocal pneumonia Active Problems:   Essential hypertension, benign   Mixed hyperlipidemia   Influenza A   Acute cystitis   Slurred speech   GERD (gastroesophageal reflux  disease)  Multifocal pneumonia POA Patient was started on ceftriaxone and azithromycin, we shall continue same at this time with plan to de-escalate/discontinue based on blood culture, sputum culture, urine Legionella, strep pneumo and procalcitonin Continue Tylenol as needed Continue Mucinex, incentive spirometry, flutter valve   Influenza A Continue Tamiflu, Mucinex, Robitussin Continue DuoNebs as needed  Acute cystitis POA Continue IV ceftriaxone Blood culture and urine culture pending  Slurred speech rule out acute ischemic stroke No slurred speech on exam, NIHSS 0 MRI of brain showed no acute abnormality CT  angiography of head and neck showed no LVO  Hypokalemia K+ 3.4, this will be replenished  Hypocalcemia Calcium 8.4, continue Os-Cal  Hyperglycemia with no known history of T2DM CBG 218, hemoglobin A1c will be checked  Mixed hyperlipidemia Continue Lipitor  Essential hypertension BP meds will be held at this time due to soft BP  GERD Continue Protonix  DVT prophylaxis: Lovenox  Code Status: Full code  Family Communication: None at bedside  Consults: None  Severity of Illness: The appropriate patient status for this patient is INPATIENT. Inpatient status is judged to be reasonable and necessary in order to provide the required intensity of service to ensure the patient's safety. The patient's presenting symptoms, physical exam findings, and initial radiographic and laboratory data in the context of their chronic comorbidities is felt to place them at high risk for further clinical deterioration. Furthermore, it is not anticipated that the patient will be medically stable for discharge from the hospital within 2 midnights of admission.   * I certify that at the point of admission it is my clinical judgment that the patient will require inpatient hospital care spanning beyond 2 midnights from the point of admission due to high intensity of service, high risk for further deterioration and high frequency of surveillance required.*  Author: Frankey Shown, DO 11/10/2023 2:25 AM  For on call review [email or web address].

## 2023-11-10 DIAGNOSIS — K219 Gastro-esophageal reflux disease without esophagitis: Secondary | ICD-10-CM | POA: Insufficient documentation

## 2023-11-10 DIAGNOSIS — J101 Influenza due to other identified influenza virus with other respiratory manifestations: Principal | ICD-10-CM | POA: Insufficient documentation

## 2023-11-10 DIAGNOSIS — J9601 Acute respiratory failure with hypoxia: Secondary | ICD-10-CM

## 2023-11-10 DIAGNOSIS — R4781 Slurred speech: Secondary | ICD-10-CM | POA: Insufficient documentation

## 2023-11-10 DIAGNOSIS — N3000 Acute cystitis without hematuria: Secondary | ICD-10-CM | POA: Insufficient documentation

## 2023-11-10 LAB — COMPREHENSIVE METABOLIC PANEL
ALT: 26 U/L (ref 0–44)
AST: 29 U/L (ref 15–41)
Albumin: 3.3 g/dL — ABNORMAL LOW (ref 3.5–5.0)
Alkaline Phosphatase: 101 U/L (ref 38–126)
Anion gap: 8 (ref 5–15)
BUN: 14 mg/dL (ref 6–20)
CO2: 25 mmol/L (ref 22–32)
Calcium: 8 mg/dL — ABNORMAL LOW (ref 8.9–10.3)
Chloride: 104 mmol/L (ref 98–111)
Creatinine, Ser: 1.06 mg/dL — ABNORMAL HIGH (ref 0.44–1.00)
GFR, Estimated: >60 mL/min (ref >60)
Glucose, Bld: 113 mg/dL — ABNORMAL HIGH (ref 70–99)
Potassium: 3.4 mmol/L — ABNORMAL LOW (ref 3.5–5.1)
Sodium: 137 mmol/L (ref 135–145)
Total Bilirubin: 0.5 mg/dL (ref 0.0–1.2)
Total Protein: 6.1 g/dL — ABNORMAL LOW (ref 6.5–8.1)

## 2023-11-10 LAB — CBC
HCT: 34.2 % — ABNORMAL LOW (ref 36.0–46.0)
Hemoglobin: 11.3 g/dL — ABNORMAL LOW (ref 12.0–15.0)
MCH: 31.8 pg (ref 26.0–34.0)
MCHC: 33 g/dL (ref 30.0–36.0)
MCV: 96.3 fL (ref 80.0–100.0)
Platelets: 111 10*3/uL — ABNORMAL LOW (ref 150–400)
RBC: 3.55 MIL/uL — ABNORMAL LOW (ref 3.87–5.11)
RDW: 14.2 % (ref 11.5–15.5)
WBC: 5.6 10*3/uL (ref 4.0–10.5)
nRBC: 0 % (ref 0.0–0.2)

## 2023-11-10 LAB — HEMOGLOBIN A1C
Hgb A1c MFr Bld: 5.5 % (ref 4.8–5.6)
Mean Plasma Glucose: 111.15 mg/dL

## 2023-11-10 LAB — PROCALCITONIN: Procalcitonin: 0.13 ng/mL

## 2023-11-10 LAB — CBG MONITORING, ED: Glucose-Capillary: 75 mg/dL (ref 70–99)

## 2023-11-10 LAB — HIV ANTIBODY (ROUTINE TESTING W REFLEX): HIV Screen 4th Generation wRfx: NONREACTIVE

## 2023-11-10 LAB — MAGNESIUM: Magnesium: 2.2 mg/dL (ref 1.7–2.4)

## 2023-11-10 LAB — PHOSPHORUS: Phosphorus: 3.3 mg/dL (ref 2.5–4.6)

## 2023-11-10 MED ORDER — PNEUMOCOCCAL 20-VAL CONJ VACC 0.5 ML IM SUSY: 0.5000 mL | PREFILLED_SYRINGE | INTRAMUSCULAR | Status: DC

## 2023-11-10 MED ORDER — ATORVASTATIN CALCIUM 20 MG PO TABS
20.0000 mg | ORAL_TABLET | Freq: Every day | ORAL | Status: DC
Start: 2023-11-10 — End: 2023-11-10
  Administered 2023-11-10: 20 mg via ORAL
  Filled 2023-11-10: qty 1

## 2023-11-10 MED ORDER — DM-GUAIFENESIN ER 30-600 MG PO TB12
1.0000 | ORAL_TABLET | Freq: Two times a day (BID) | ORAL | Status: DC
  Administered 2023-11-10 (×2): 1 via ORAL
  Filled 2023-11-10 (×2): qty 1

## 2023-11-10 MED ORDER — CALCIUM CARBONATE 1250 (500 CA) MG PO TABS
1.0000 | ORAL_TABLET | Freq: Every day | ORAL | Status: DC
  Administered 2023-11-10: 1250 mg via ORAL
  Filled 2023-11-10: qty 1

## 2023-11-10 MED ORDER — PSEUDOEPHEDRINE HCL 60 MG PO TABS
60.0000 mg | ORAL_TABLET | Freq: Three times a day (TID) | ORAL | Status: DC
Start: 2023-11-10 — End: 2023-11-10
  Administered 2023-11-10: 60 mg via ORAL
  Filled 2023-11-10: qty 1

## 2023-11-10 MED ORDER — ONDANSETRON HCL 4 MG PO TABS
4.0000 mg | ORAL_TABLET | Freq: Four times a day (QID) | ORAL | Status: DC | PRN
Start: 2023-11-10 — End: 2023-11-10

## 2023-11-10 MED ORDER — SODIUM CHLORIDE 0.9 % IV SOLN: 500.0000 mg | Freq: Every day | INTRAVENOUS | Status: DC

## 2023-11-10 MED ORDER — OSELTAMIVIR PHOSPHATE 75 MG PO CAPS
75.0000 mg | ORAL_CAPSULE | Freq: Two times a day (BID) | ORAL | Status: DC
  Administered 2023-11-10: 75 mg via ORAL
  Filled 2023-11-10: qty 1

## 2023-11-10 MED ORDER — ONDANSETRON HCL 4 MG/2ML IJ SOLN: 4.0000 mg | Freq: Four times a day (QID) | INTRAMUSCULAR | Status: DC | PRN

## 2023-11-10 MED ORDER — DM-GUAIFENESIN ER 30-600 MG PO TB12: 1.0000 | ORAL_TABLET | Freq: Two times a day (BID) | ORAL | 0 refills | Status: AC | PRN

## 2023-11-10 MED ORDER — BUTALBITAL-APAP-CAFFEINE 50-325-40 MG PO TABS
2.0000 | ORAL_TABLET | Freq: Once | ORAL | Status: AC
  Administered 2023-11-10: 2 via ORAL
  Filled 2023-11-10: qty 2

## 2023-11-10 MED ORDER — SODIUM CHLORIDE 0.9 % IV SOLN: 2.0000 g | INTRAVENOUS | Status: DC

## 2023-11-10 MED ORDER — PSEUDOEPHEDRINE HCL 60 MG PO TABS: 60.0000 mg | ORAL_TABLET | Freq: Three times a day (TID) | ORAL | 0 refills | Status: AC | PRN

## 2023-11-10 MED ORDER — LACTATED RINGERS IV BOLUS
500.0000 mL | Freq: Once | INTRAVENOUS | Status: AC
  Administered 2023-11-10: 500 mL via INTRAVENOUS

## 2023-11-10 MED ORDER — IPRATROPIUM-ALBUTEROL 0.5-2.5 (3) MG/3ML IN SOLN: 3.0000 mL | RESPIRATORY_TRACT | Status: DC | PRN

## 2023-11-10 MED ORDER — PANTOPRAZOLE SODIUM 40 MG PO TBEC
40.0000 mg | DELAYED_RELEASE_TABLET | Freq: Every day | ORAL | Status: DC
  Administered 2023-11-10: 40 mg via ORAL
  Filled 2023-11-10: qty 1

## 2023-11-10 MED ORDER — ENOXAPARIN SODIUM 40 MG/0.4ML IJ SOSY
40.0000 mg | PREFILLED_SYRINGE | INTRAMUSCULAR | Status: DC
  Administered 2023-11-10: 40 mg via SUBCUTANEOUS

## 2023-11-10 MED ORDER — POTASSIUM CHLORIDE CRYS ER 20 MEQ PO TBCR
40.0000 meq | EXTENDED_RELEASE_TABLET | Freq: Once | ORAL | Status: AC
  Administered 2023-11-10: 40 meq via ORAL
  Filled 2023-11-10: qty 2

## 2023-11-10 MED ORDER — ACETAMINOPHEN 650 MG RE SUPP: 650.0000 mg | Freq: Four times a day (QID) | RECTAL | Status: DC | PRN

## 2023-11-10 MED ORDER — ACETAMINOPHEN 325 MG PO TABS: 650.0000 mg | ORAL_TABLET | Freq: Four times a day (QID) | ORAL | Status: DC | PRN

## 2023-11-10 MED ORDER — GUAIFENESIN-DM 100-10 MG/5ML PO SYRP: 5.0000 mL | ORAL_SOLUTION | ORAL | Status: DC | PRN

## 2023-11-10 NOTE — Progress Notes (Signed)
Mobility Specialist Progress Note:    11/10/23 1042  Mobility  Activity Ambulated with assistance in hallway  Level of Assistance Modified independent, requires aide device or extra time  Assistive Device None  Distance Ambulated (ft) 100 ft  Range of Motion/Exercises Active;All extremities  Activity Response Tolerated well  Mobility Referral Yes  Mobility visit 1 Mobility  Mobility Specialist Start Time (ACUTE ONLY) 1020  Mobility Specialist Stop Time (ACUTE ONLY) 1035  Mobility Specialist Time Calculation (min) (ACUTE ONLY) 15 min   Pt received sitting EOB, agreeable to mobility. ModI to stand and ambulate with no AD. Tolerated well, see O2 sats below. Returned to room, left pt sitting EOB with NT. All needs met.   SpO2 97% on RA at rest SpO2 92% on RA during ambulation  Lawerance Bach Mobility Specialist Please contact via SecureChat or  Rehab office at [phone]

## 2023-11-10 NOTE — Discharge Instructions (Signed)
Please do not take your blood pressure medications until follow up with your primary doctor. I recommend seeing them within 1-2 weeks to recheck that your blood pressures have increased again after your recovery.  Your other medications have remained the same. I have prescribed some congestion/cough medications to take as needed.  Please wear a mask and practice good distance and hand-washing techniques to help prevent spread of flu until you have recovered.

## 2023-11-10 NOTE — Discharge Summary (Signed)
 Physician Discharge Summary  Patient: Dominique Harrington NWG:[id number] DOB: 07/05/1971   Code Status: Full Code Admit date: 11/09/2023 Discharge date: 11/10/2023 Disposition: Home, No home health services recommended PCP: Benita Stabile, MD  Recommendations for Outpatient Follow-up:  Follow up with PCP within 1-2 weeks Regarding general hospital follow up and preventative care Recommend monitoring blood pressure and restarting home medications as needed.   Discharge Diagnoses:  Principal Problem:   Multifocal pneumonia Active Problems:   Essential hypertension, benign   Mixed hyperlipidemia   Influenza A   Acute cystitis   Slurred speech   GERD (gastroesophageal reflux disease)   Acute respiratory failure with hypoxia [facility])  Brief Hospital Course Summary: Pt ***  All other chronic conditions were treated with home medications.    Discharge Condition: {DISCHARGE CONDITION:19696}, improved Recommended discharge diet: {Discharge VHQI:[id number]}  Consultations: ***  Procedures/Studies: ***  Discharge Instructions     Discharge patient   Complete by: As directed    Discharge disposition: 01-Home or Self Care   Discharge patient date: 11/10/2023      Allergies as of 11/10/2023       Reactions   Sulfa Antibiotics Nausea And Vomiting        Medication List     PAUSE taking these medications    amLODipine 10 MG tablet Wait to take this until your doctor or other care provider tells you to start again. Commonly known as: NORVASC Take 10 mg by mouth daily.   metoprolol succinate 25 MG 24 hr tablet Wait to take this until your doctor or other care provider tells you to start again. Commonly known as: TOPROL-XL Take 25 mg by mouth daily.       TAKE these medications    amitriptyline 25 MG tablet Commonly known as: ELAVIL Take 25 mg by mouth at bedtime.   aspirin EC 81 MG tablet Take 81 mg by mouth daily.   atorvastatin 20 MG tablet Commonly known as:  LIPITOR Take 20 mg by mouth daily.   butalbital-acetaminophen-caffeine 50-325-40 MG tablet Commonly known as: FIORICET Take 1 tablet by mouth every 8 (eight) hours as needed for migraine.   DAYQUIL/NYQUIL COLD/FLU RELIEF PO Take 30 mLs by mouth every 6 (six) hours as needed (cold/flu-like symptoms).   dextromethorphan-guaiFENesin 30-600 MG 12hr tablet Commonly known as: MUCINEX DM Take 1 tablet by mouth 2 (two) times daily as needed for up to 5 days for cough.   escitalopram 20 MG tablet Commonly known as: LEXAPRO Take 20 mg by mouth daily.   ibuprofen 200 MG tablet Commonly known as: ADVIL Take 800 mg by mouth every 6 (six) hours as needed (migraine).   omeprazole 10 MG capsule Commonly known as: PRILOSEC Take 20 mg by mouth daily.   promethazine 25 MG tablet Commonly known as: PHENERGAN Take 1 tablet by mouth every 4 (four) hours as needed for nausea or vomiting.   pseudoephedrine 60 MG tablet Commonly known as: SUDAFED Take 1 tablet (60 mg total) by mouth every 8 (eight) hours as needed for up to 5 days for congestion.   tiZANidine 4 MG capsule Commonly known as: ZANAFLEX Take 4 mg by mouth 3 (three) times daily.        Follow-up Information     Benita Stabile, MD. Schedule an appointment as soon as possible for a visit in 1 week(s).   Specialty: Internal Medicine Contact information: [address] [place] [address] [phone]  Subjective   Pt reports ***  All questions and concerns were addressed at time of discharge.  Objective  Blood pressure (!) 84/63, pulse 65, temperature 97.8 F (36.6 C), temperature source Oral, resp. rate 18, height 5\' 7"  (1.702 m), weight 118.8 kg, last menstrual period 12/19/2014, SpO2 96%.   General: Pt is alert, awake, not in acute distress Cardiovascular: RRR, S1/S2 +, no rubs, no gallops Respiratory: CTA bilaterally, no wheezing, no rhonchi Abdominal: Soft, NT, ND, bowel sounds  + Extremities: no edema, no cyanosis  The results of significant diagnostics from this hospitalization (including imaging, microbiology, ancillary and laboratory) are listed below for reference.   Imaging studies: CT ABDOMEN PELVIS W CONTRAST Result Date: 11/09/2023 CLINICAL DATA:  Abdominal pain EXAM: CT ABDOMEN AND PELVIS WITH CONTRAST TECHNIQUE: Multidetector CT imaging of the abdomen and pelvis was performed using the standard protocol following bolus administration of intravenous contrast. RADIATION DOSE REDUCTION: This exam was performed according to the departmental dose-optimization program which includes automated exposure control, adjustment of the mA and/or kV according to patient size and/or use of iterative reconstruction technique. CONTRAST:  80mL OMNIPAQUE IOHEXOL 350 MG/ML SOLN COMPARISON:  None Available. FINDINGS: Lower chest: See chest CT report today. Hepatobiliary: No focal hepatic abnormality. Gallbladder unremarkable. Pancreas: Fatty replacement. No focal abnormality or ductal dilatation. Spleen: No focal abnormality.  Normal size. Adrenals/Urinary Tract: No suspicious renal or adrenal lesion. No hydronephrosis. Contrast material within the collecting systems and bladder preclude assessment for stones. Urinary bladder unremarkable. Stomach/Bowel: Stomach, large and small bowel grossly unremarkable. Vascular/Lymphatic: No evidence of aneurysm or adenopathy. Reproductive: Uterus and adnexa unremarkable.  No mass. Other: No free fluid or free air.  Prior ventral hernia repair Musculoskeletal: No acute bony abnormality. IMPRESSION: No acute findings in the abdomen or pelvis. Electronically Signed   By: Charlett Nose M.D.   On: 11/09/2023 21:12   CT Angio Chest PE W and/or Wo Contrast Result Date: 11/09/2023 CLINICAL DATA:  Pulmonary embolism (PE) suspected, high prob. Cough. EXAM: CT ANGIOGRAPHY CHEST WITH CONTRAST TECHNIQUE: Multidetector CT imaging of the chest was performed using the  standard protocol during bolus administration of intravenous contrast. Multiplanar CT image reconstructions and MIPs were obtained to evaluate the vascular anatomy. RADIATION DOSE REDUCTION: This exam was performed according to the departmental dose-optimization program which includes automated exposure control, adjustment of the mA and/or kV according to patient size and/or use of iterative reconstruction technique. CONTRAST:  80mL OMNIPAQUE IOHEXOL 350 MG/ML SOLN COMPARISON:  Chest x-ray today. FINDINGS: Cardiovascular: No filling defects in the pulmonary arteries to suggest pulmonary emboli. Heart borderline in size. Aorta normal caliber. Mediastinum/Nodes: Scattered borderline and mildly enlarged mediastinal lymph nodes, the largest in the anterior mediastinum measuring 1.5 cm. No axillary or hilar adenopathy. Trachea and esophagus are unremarkable. Thyroid unremarkable. Lungs/Pleura: Scattered ground-glass opacities in the upper lobes and right lower lobe. No effusions. Upper Abdomen: No acute findings Musculoskeletal: Chest wall soft tissues are unremarkable. No acute bony abnormality. Review of the MIP images confirms the above findings. IMPRESSION: Scattered ground-glass opacities in the upper lobes and right lower lobe concerning for early infiltrates/pneumonia. No evidence of pulmonary embolus. Prominent mediastinal lymph nodes, likely reactive. These could be followed with repeat CT in 6 months to ensure stability. Electronically Signed   By: Charlett Nose M.D.   On: 11/09/2023 21:10   MR BRAIN WO CONTRAST Result Date: 11/09/2023 CLINICAL DATA:  Neuro deficit, acute, stroke suspected. EXAM: MRI HEAD WITHOUT CONTRAST TECHNIQUE: Multiplanar, multiecho pulse sequences of  the brain and surrounding structures were obtained without intravenous contrast. COMPARISON:  CTA head/neck from today. FINDINGS: Brain: No acute infarction, hemorrhage, hydrocephalus, extra-axial collection or mass lesion. Mild T2/FLAIR  hyperintense white matter are nonspecific but considered within normal limits for patient age and most likely secondary to chronic microvascular ischemic change. Vascular: Major arterial flow voids are maintained at the skull base. Skull and upper cervical spine: Normal marrow signal. Sinuses/Orbits: Clear sinuses.  No acute orbital findings. Other: No sizable mastoid effusions. IMPRESSION: Normal brain MRI for patient age.  No acute abnormality. Electronically Signed   By: Feliberto Harts M.D.   On: 11/09/2023 20:01   CT ANGIO HEAD NECK W WO CM Result Date: 11/09/2023 CLINICAL DATA:  Neuro deficit, acute, stroke suspected. Slurred speech. Right-sided weakness. Confusion. EXAM: CT ANGIOGRAPHY HEAD AND NECK WITH AND WITHOUT CONTRAST TECHNIQUE: Multidetector CT imaging of the head and neck was performed using the standard protocol during bolus administration of intravenous contrast. Multiplanar CT image reconstructions and MIPs were obtained to evaluate the vascular anatomy. Carotid stenosis measurements (when applicable) are obtained utilizing NASCET criteria, using the distal internal carotid diameter as the denominator. RADIATION DOSE REDUCTION: This exam was performed according to the departmental dose-optimization program which includes automated exposure control, adjustment of the mA and/or kV according to patient size and/or use of iterative reconstruction technique. CONTRAST:  75mL OMNIPAQUE IOHEXOL 350 MG/ML SOLN COMPARISON:  MRI same day FINDINGS: CT HEAD FINDINGS Brain: The brain shows a normal appearance without evidence of malformation, atrophy, old or acute small or large vessel infarction, mass lesion, hemorrhage, hydrocephalus or extra-axial collection. Vascular: No hyperdense vessel. No evidence of atherosclerotic calcification. Skull: Normal.  No traumatic finding.  No focal bone lesion. Sinuses/Orbits: Sinuses are clear. Orbits appear normal. Mastoids are clear. Other: None significant CTA NECK  FINDINGS Aortic arch: Normal Right carotid system: Common carotid artery is widely patent. There is calcified plaque at the carotid bifurcation and ICA bulb but no stenosis. Cervical ICA is tortuous but widely patent. Left carotid system: Common carotid artery widely patent to the bifurcation. Soft and calcified plaque at the carotid bifurcation and ICA bulb but no stenosis. Cervical ICA is tortuous but widely patent. Vertebral arteries: No proximal subclavian stenosis. Both vertebral artery origins are widely patent. Both vertebral arteries are normal through the cervical region to the foramen magnum. Skeleton: Mild cervical spondylosis. Other neck: No mass or lymphadenopathy. Upper chest: Mild patchy density in the upper lobes which could be scarring or mild bronchopneumonia. Review of the MIP images confirms the above findings CTA HEAD FINDINGS Anterior circulation: Both internal carotid arteries are patent through the skull base and siphon regions. No siphon stenosis. Anterior and middle cerebral vessels are normal. No large vessel occlusion or proximal stenosis. No aneurysm or vascular malformation. Posterior circulation: Both vertebral arteries widely patent to the basilar artery. No basilar stenosis. Posterior circulation branch vessels are normal. Venous sinuses: Patent and normal. Anatomic variants: None significant. Review of the MIP images confirms the above findings IMPRESSION: 1. Normal head CT. 2. No intracranial large or medium vessel occlusion or correctable proximal stenosis. 3. Bilateral carotid bifurcation atherosclerosis but no stenosis. Tortuous cervical internal carotid arteries bilaterally. 4. Mild patchy density in the upper lobes which could be scarring or mild bronchopneumonia. Electronically Signed   By: Paulina Fusi M.D.   On: 11/09/2023 19:22   DG Chest Portable 1 View Result Date: 11/09/2023 CLINICAL DATA:  Cough, stroke symptoms EXAM: PORTABLE CHEST 1 VIEW COMPARISON:  05/29/2020  FINDINGS: The heart size and mediastinal contours are within normal limits. Both lungs are clear. The visualized skeletal structures are unremarkable. IMPRESSION: No active disease. Electronically Signed   By: Sharlet Salina M.D.   On: 11/09/2023 18:24    Labs: Basic Metabolic Panel: Recent Labs  Lab 11/09/23 1729 11/10/23 0208  NA 133* 137  K 3.4* 3.4*  CL 99 104  CO2 21* 25  GLUCOSE 218* 113*  BUN 13 14  CREATININE 1.07* 1.06*  CALCIUM 8.4* 8.0*  MG  --  2.2  PHOS  --  3.3   CBC: Recent Labs  Lab 11/09/23 1729 11/10/23 0208  WBC 8.0 5.6  NEUTROABS 7.0  --   HGB 12.7 11.3*  HCT 37.4 34.2*  MCV 93.3 96.3  PLT 133* 111*   Microbiology: Results for orders placed or performed during the hospital encounter of 11/09/23  Resp panel by RT-PCR (RSV, Flu A&B, Covid) Anterior Nasal Swab     Status: Abnormal   Collection Time: 11/09/23  5:01 PM   Specimen: Anterior Nasal Swab  Result Value Ref Range Status   SARS Coronavirus 2 by RT PCR NEGATIVE NEGATIVE Final    Comment: (NOTE) SARS-CoV-2 target nucleic acids are NOT DETECTED.  The SARS-CoV-2 RNA is generally detectable in upper respiratory specimens during the acute phase of infection. The lowest concentration of SARS-CoV-2 viral copies this assay can detect is 138 copies/mL. A negative result does not preclude SARS-Cov-2 infection and should not be used as the sole basis for treatment or other patient management decisions. A negative result may occur with  improper specimen collection/handling, submission of specimen other than nasopharyngeal swab, presence of viral mutation(s) within the areas targeted by this assay, and inadequate number of viral copies(<138 copies/mL). A negative result must be combined with clinical observations, patient history, and epidemiological information. The expected result is Negative.  Fact Sheet for Patients:  BloggerCourse.com  Fact Sheet for Healthcare  Providers:  SeriousBroker.it  This test is no t yet approved or cleared by the [place] FDA and  has been authorized for detection and/or diagnosis of SARS-CoV-2 by FDA under an Emergency Use Authorization (EUA). This EUA will remain  in effect (meaning this test can be used) for the duration of the COVID-19 declaration under Section 564(b)(1) of the Act, 21 U.S.C.section 360bbb-3(b)(1), unless the authorization is terminated  or revoked sooner.       Influenza A by PCR POSITIVE (A) NEGATIVE Final   Influenza B by PCR NEGATIVE NEGATIVE Final    Comment: (NOTE) The Xpert Xpress SARS-CoV-2/FLU/RSV plus assay is intended as an aid in the diagnosis of influenza from Nasopharyngeal swab specimens and should not be used as a sole basis for treatment. Nasal washings and aspirates are unacceptable for Xpert Xpress SARS-CoV-2/FLU/RSV testing.  Fact Sheet for Patients: BloggerCourse.com  Fact Sheet for Healthcare Providers: SeriousBroker.it  This test is not yet approved or cleared by the [place] FDA and has been authorized for detection and/or diagnosis of SARS-CoV-2 by FDA under an Emergency Use Authorization (EUA). This EUA will remain in effect (meaning this test can be used) for the duration of the COVID-19 declaration under Section 564(b)(1) of the Act, 21 U.S.C. section 360bbb-3(b)(1), unless the authorization is terminated or revoked.     Resp Syncytial Virus by PCR NEGATIVE NEGATIVE Final    Comment: (NOTE) Fact Sheet for Patients: BloggerCourse.com  Fact Sheet for Healthcare Providers: SeriousBroker.it  This test is not yet approved or cleared by  the [employer] and has been authorized for detection and/or diagnosis of SARS-CoV-2 by FDA under an Emergency Use Authorization (EUA). This EUA will remain in effect (meaning this test  can be used) for the duration of the COVID-19 declaration under Section 564(b)(1) of the Act, 21 U.S.C. section 360bbb-3(b)(1), unless the authorization is terminated or revoked.  Performed at [facility], [address], [place], [place] [address]   Culture, blood (routine x 2) Call MD if unable to obtain prior to antibiotics being given     Status: None (Preliminary result)   Collection Time: 11/10/23  2:10 AM   Specimen: BLOOD  Result Value Ref Range Status   Specimen Description BLOOD BLOOD RIGHT ARM  Final   Special Requests   Final    BOTTLES DRAWN AEROBIC AND ANAEROBIC Blood Culture adequate volume   Culture   Final    NO GROWTH < 12 HOURS Performed at [facility], [address], [place], [place] [address]    Report Status PENDING  Incomplete  Culture, blood (routine x 2) Call MD if unable to obtain prior to antibiotics being given     Status: None (Preliminary result)   Collection Time: 11/10/23  2:10 AM   Specimen: BLOOD  Result Value Ref Range Status   Specimen Description BLOOD BLOOD RIGHT HAND  Final   Special Requests   Final    BOTTLES DRAWN AEROBIC AND ANAEROBIC Blood Culture adequate volume   Culture   Final    NO GROWTH < 12 HOURS Performed at [facility], [address], [place], [place] [address]    Report Status PENDING  Incomplete    Time coordinating discharge: Over 30 minutes  Leeroy Bock, MD  [facility] 11/10/2023, 11:18 AM

## 2023-11-10 NOTE — ED Notes (Signed)
Provider contacted for medication order for pain rated 10/10.

## 2023-11-10 NOTE — Progress Notes (Incomplete)
 PROGRESS NOTE  Dominique Harrington    DOB: June 07, 1971, 53 y.o.  ZOX:[id number]    Code Status: Full Code   DOA: 11/09/2023   LOS: 1   Brief hospital course  Dominique Harrington is a 53 y.o. female with medical history significant of hypertension, hyperlipidemia, migraine, GERD who presents to the emergency department due to confusion, hallucination, slurred speech which started about 3 days ago, she complained of right-sided weakness last night.  Patient endorsed 1 week onset of cough and chest congestion which was being treated with NyQuil and DayQuil.  At bedside, she denies any weakness on the right side.  She states she was recently started on amitriptyline and she thought that this may be the cause of her symptoms.  Patient also complaining of increased urinary frequency within the last few days, though she denies burning sensation on urination.   ED Course:  In the emergency department, patient was tachypneic and bradycardic.  BP on arrival was 100/86, other vital signs are within normal range.  Workup in the ED showed normal CBC except for platelets of 133.  BMP showed sodium 133, potassium 3.4, chloride 99, bicarb 21, blood glucose 218, BUN 13, creatinine 1.07, calcium 8.4.  Alcohol level was less than 10, Tylenol level was 16, urine drug screen was positive for benzodiazepine, urinalysis was positive for nitrite and moderate hematuria.  Influenza A was positive.  Influenza B, SARS coronavirus 2, RSV was negative. CT abdomen and pelvis with contrast showed no acute findings in the abdomen or pelvis CT angio chest PE showed no evidence of pulmonary embolus, but showed scattered  ground-glass opacities in the upper lobes and right lower lobe concerning for early infiltrates/pneumonia. MRI brain without contrast showed normal brain MRI for patient age.  No acute abnormality CT angiography head and neck with and without contrast showed normal head CT no intracranial large or medium vessel occlusion or  correctable proximal stenosis. Patient was treated with IV ceftriaxone and azithromycin, Tamiflu was given due to influenza A, IV hydration was provided.  Hospitalist was asked to admit patient for further evaluation and management.  Patient was admitted to medicine service for further workup and management of *** as outlined in detail below.  11/10/23 -***  Assessment & Plan  Principal Problem:   Multifocal pneumonia Active Problems:   Essential hypertension, benign   Mixed hyperlipidemia   Influenza A   Acute cystitis   Slurred speech   GERD (gastroesophageal reflux disease)  *** -   *** -   *** -   *** -   *** -   Body mass index is 37.98 kg/m.  VTE ppx: enoxaparin (LOVENOX) injection 40 mg Start: 11/10/23 1000 SCDs Start: 11/10/23 0139   Diet:     Diet   Diet heart healthy/carb modified Room service appropriate? Yes; Fluid consistency: Thin   Consultants: ***  Subjective 11/10/23    Pt reports ***   Objective   Vitals:   11/10/23 0600 11/10/23 0630 11/10/23 0644 11/10/23 0700  BP: 101/86 109/82  111/82  Pulse: (!) 169 76  67  Resp:    18  Temp:   98.3 F (36.8 C)   TempSrc:   Oral   SpO2: 97% 99%  94%  Weight:      Height:        Intake/Output Summary (Last 24 hours) at 11/10/2023 0807 Last data filed at 11/10/2023 0257 Gross per 24 hour  Intake 1790 ml  Output --  Net 1790  ml   Filed Weights   11/09/23 1702  Weight: 110 kg     Physical Exam: *** General: awake, alert, NAD HEENT: atraumatic, clear conjunctiva, anicteric sclera, MMM, hearing grossly normal Respiratory: normal respiratory effort. Cardiovascular: quick capillary refill, normal S1/S2, RRR, no JVD, murmurs Gastrointestinal: soft, NT, ND Nervous: A&O x3. no gross focal neurologic deficits, normal speech Extremities: moves all equally, no edema, normal tone Skin: dry, intact, normal temperature, normal color. No rashes, lesions or ulcers on exposed skin Psychiatry:  normal mood, congruent affect  Labs   I have personally reviewed the following labs and imaging studies CBC    Component Value Date/Time   WBC 5.6 11/10/2023 0208   RBC 3.55 (L) 11/10/2023 0208   HGB 11.3 (L) 11/10/2023 0208   HCT 34.2 (L) 11/10/2023 0208   PLT 111 (L) 11/10/2023 0208   MCV 96.3 11/10/2023 0208   MCH 31.8 11/10/2023 0208   MCHC 33.0 11/10/2023 0208   RDW 14.2 11/10/2023 0208   LYMPHSABS 0.6 (L) 11/09/2023 1729   MONOABS 0.4 11/09/2023 1729   EOSABS 0.0 11/09/2023 1729   BASOSABS 0.0 11/09/2023 1729      Latest Ref Rng & Units 11/10/2023    2:08 AM 11/09/2023    5:29 PM 05/29/2020    5:23 PM  BMP  Glucose 70 - 99 mg/dL 409  811    BUN 6 - 20 mg/dL 14  13    Creatinine 9.14 - 1.00 mg/dL 7.82  9.56  2.13   Sodium 135 - 145 mmol/L 137  133    Potassium 3.5 - 5.1 mmol/L 3.4  3.4    Chloride 98 - 111 mmol/L 104  99    CO2 22 - 32 mmol/L 25  21    Calcium 8.9 - 10.3 mg/dL 8.0  8.4      CT ABDOMEN PELVIS W CONTRAST Result Date: 11/09/2023 CLINICAL DATA:  Abdominal pain EXAM: CT ABDOMEN AND PELVIS WITH CONTRAST TECHNIQUE: Multidetector CT imaging of the abdomen and pelvis was performed using the standard protocol following bolus administration of intravenous contrast. RADIATION DOSE REDUCTION: This exam was performed according to the departmental dose-optimization program which includes automated exposure control, adjustment of the mA and/or kV according to patient size and/or use of iterative reconstruction technique. CONTRAST:  80mL OMNIPAQUE IOHEXOL 350 MG/ML SOLN COMPARISON:  None Available. FINDINGS: Lower chest: See chest CT report today. Hepatobiliary: No focal hepatic abnormality. Gallbladder unremarkable. Pancreas: Fatty replacement. No focal abnormality or ductal dilatation. Spleen: No focal abnormality.  Normal size. Adrenals/Urinary Tract: No suspicious renal or adrenal lesion. No hydronephrosis. Contrast material within the collecting systems and bladder  preclude assessment for stones. Urinary bladder unremarkable. Stomach/Bowel: Stomach, large and small bowel grossly unremarkable. Vascular/Lymphatic: No evidence of aneurysm or adenopathy. Reproductive: Uterus and adnexa unremarkable.  No mass. Other: No free fluid or free air.  Prior ventral hernia repair Musculoskeletal: No acute bony abnormality. IMPRESSION: No acute findings in the abdomen or pelvis. Electronically Signed   By: Charlett Nose M.D.   On: 11/09/2023 21:12   CT Angio Chest PE W and/or Wo Contrast Result Date: 11/09/2023 CLINICAL DATA:  Pulmonary embolism (PE) suspected, high prob. Cough. EXAM: CT ANGIOGRAPHY CHEST WITH CONTRAST TECHNIQUE: Multidetector CT imaging of the chest was performed using the standard protocol during bolus administration of intravenous contrast. Multiplanar CT image reconstructions and MIPs were obtained to evaluate the vascular anatomy. RADIATION DOSE REDUCTION: This exam was performed according to the departmental dose-optimization program which includes  automated exposure control, adjustment of the mA and/or kV according to patient size and/or use of iterative reconstruction technique. CONTRAST:  80mL OMNIPAQUE IOHEXOL 350 MG/ML SOLN COMPARISON:  Chest x-ray today. FINDINGS: Cardiovascular: No filling defects in the pulmonary arteries to suggest pulmonary emboli. Heart borderline in size. Aorta normal caliber. Mediastinum/Nodes: Scattered borderline and mildly enlarged mediastinal lymph nodes, the largest in the anterior mediastinum measuring 1.5 cm. No axillary or hilar adenopathy. Trachea and esophagus are unremarkable. Thyroid unremarkable. Lungs/Pleura: Scattered ground-glass opacities in the upper lobes and right lower lobe. No effusions. Upper Abdomen: No acute findings Musculoskeletal: Chest wall soft tissues are unremarkable. No acute bony abnormality. Review of the MIP images confirms the above findings. IMPRESSION: Scattered ground-glass opacities in the upper  lobes and right lower lobe concerning for early infiltrates/pneumonia. No evidence of pulmonary embolus. Prominent mediastinal lymph nodes, likely reactive. These could be followed with repeat CT in 6 months to ensure stability. Electronically Signed   By: Charlett Nose M.D.   On: 11/09/2023 21:10   MR BRAIN WO CONTRAST Result Date: 11/09/2023 CLINICAL DATA:  Neuro deficit, acute, stroke suspected. EXAM: MRI HEAD WITHOUT CONTRAST TECHNIQUE: Multiplanar, multiecho pulse sequences of the brain and surrounding structures were obtained without intravenous contrast. COMPARISON:  CTA head/neck from today. FINDINGS: Brain: No acute infarction, hemorrhage, hydrocephalus, extra-axial collection or mass lesion. Mild T2/FLAIR hyperintense white matter are nonspecific but considered within normal limits for patient age and most likely secondary to chronic microvascular ischemic change. Vascular: Major arterial flow voids are maintained at the skull base. Skull and upper cervical spine: Normal marrow signal. Sinuses/Orbits: Clear sinuses.  No acute orbital findings. Other: No sizable mastoid effusions. IMPRESSION: Normal brain MRI for patient age.  No acute abnormality. Electronically Signed   By: Feliberto Harts M.D.   On: 11/09/2023 20:01   CT ANGIO HEAD NECK W WO CM Result Date: 11/09/2023 CLINICAL DATA:  Neuro deficit, acute, stroke suspected. Slurred speech. Right-sided weakness. Confusion. EXAM: CT ANGIOGRAPHY HEAD AND NECK WITH AND WITHOUT CONTRAST TECHNIQUE: Multidetector CT imaging of the head and neck was performed using the standard protocol during bolus administration of intravenous contrast. Multiplanar CT image reconstructions and MIPs were obtained to evaluate the vascular anatomy. Carotid stenosis measurements (when applicable) are obtained utilizing NASCET criteria, using the distal internal carotid diameter as the denominator. RADIATION DOSE REDUCTION: This exam was performed according to the  departmental dose-optimization program which includes automated exposure control, adjustment of the mA and/or kV according to patient size and/or use of iterative reconstruction technique. CONTRAST:  75mL OMNIPAQUE IOHEXOL 350 MG/ML SOLN COMPARISON:  MRI same day FINDINGS: CT HEAD FINDINGS Brain: The brain shows a normal appearance without evidence of malformation, atrophy, old or acute small or large vessel infarction, mass lesion, hemorrhage, hydrocephalus or extra-axial collection. Vascular: No hyperdense vessel. No evidence of atherosclerotic calcification. Skull: Normal.  No traumatic finding.  No focal bone lesion. Sinuses/Orbits: Sinuses are clear. Orbits appear normal. Mastoids are clear. Other: None significant CTA NECK FINDINGS Aortic arch: Normal Right carotid system: Common carotid artery is widely patent. There is calcified plaque at the carotid bifurcation and ICA bulb but no stenosis. Cervical ICA is tortuous but widely patent. Left carotid system: Common carotid artery widely patent to the bifurcation. Soft and calcified plaque at the carotid bifurcation and ICA bulb but no stenosis. Cervical ICA is tortuous but widely patent. Vertebral arteries: No proximal subclavian stenosis. Both vertebral artery origins are widely patent. Both vertebral arteries are normal through  the cervical region to the foramen magnum. Skeleton: Mild cervical spondylosis. Other neck: No mass or lymphadenopathy. Upper chest: Mild patchy density in the upper lobes which could be scarring or mild bronchopneumonia. Review of the MIP images confirms the above findings CTA HEAD FINDINGS Anterior circulation: Both internal carotid arteries are patent through the skull base and siphon regions. No siphon stenosis. Anterior and middle cerebral vessels are normal. No large vessel occlusion or proximal stenosis. No aneurysm or vascular malformation. Posterior circulation: Both vertebral arteries widely patent to the basilar artery. No  basilar stenosis. Posterior circulation branch vessels are normal. Venous sinuses: Patent and normal. Anatomic variants: None significant. Review of the MIP images confirms the above findings IMPRESSION: 1. Normal head CT. 2. No intracranial large or medium vessel occlusion or correctable proximal stenosis. 3. Bilateral carotid bifurcation atherosclerosis but no stenosis. Tortuous cervical internal carotid arteries bilaterally. 4. Mild patchy density in the upper lobes which could be scarring or mild bronchopneumonia. Electronically Signed   By: Paulina Fusi M.D.   On: 11/09/2023 19:22   DG Chest Portable 1 View Result Date: 11/09/2023 CLINICAL DATA:  Cough, stroke symptoms EXAM: PORTABLE CHEST 1 VIEW COMPARISON:  05/29/2020 FINDINGS: The heart size and mediastinal contours are within normal limits. Both lungs are clear. The visualized skeletal structures are unremarkable. IMPRESSION: No active disease. Electronically Signed   By: Sharlet Salina M.D.   On: 11/09/2023 18:24    Disposition Plan & Communication  Patient status: Inpatient  Admitted From: {From:[id number]} Planned disposition location: {PLAN; DISPOSITION:26386} Anticipated discharge date: *** pending ***  Family Communication: ***    Author: Leeroy Bock, DO [facility] 11/10/2023, 8:07 AM   Available by Epic secure chat 7AM-7PM. If 7PM-7AM, please contact night-coverage.  [facility] contact information found on [email or web address].

## 2023-11-10 NOTE — Progress Notes (Signed)
Transition of Care Department [facility]) has reviewed patient and no other TOC needs have been identified at this time. We will continue to monitor patient advancement through interdisciplinary progression rounds. If new patient transition needs arise, please place a TOC consult.   11/10/23 0919  TOC Brief Assessment  Insurance and Status Lapsed  Patient has primary care physician Yes  Home environment has been reviewed Lives with husband and daughter.  Prior level of function: Independent.  Prior/Current Home Services No current home services  Social Drivers of Health Review SDOH reviewed no interventions necessary  Readmission risk has been reviewed Yes  Transition of care needs no transition of care needs at this time

## 2023-11-12 LAB — URINE CULTURE: Culture: >=100000 — AB

## 2023-11-15 LAB — CULTURE, BLOOD (ROUTINE X 2)
Culture: NO GROWTH
Culture: NO GROWTH
Special Requests: ADEQUATE
Special Requests: ADEQUATE
